# Patient Record
Sex: Female | Born: 1990 | Race: White | Hispanic: No | Smoking: Former smoker
Health system: Southern US, Community
[De-identification: ages and names within clinical notes are randomized; demographics above are authoritative.]

## PROBLEM LIST (undated history)

## (undated) ENCOUNTER — Inpatient Hospital Stay (HOSPITAL_COMMUNITY): Payer: Self-pay

## (undated) DIAGNOSIS — N83209 Unspecified ovarian cyst, unspecified side: Secondary | ICD-10-CM

## (undated) DIAGNOSIS — F32A Depression, unspecified: Secondary | ICD-10-CM

## (undated) DIAGNOSIS — I440 Atrioventricular block, first degree: Secondary | ICD-10-CM

## (undated) DIAGNOSIS — L309 Dermatitis, unspecified: Secondary | ICD-10-CM

## (undated) DIAGNOSIS — B9689 Other specified bacterial agents as the cause of diseases classified elsewhere: Secondary | ICD-10-CM

## (undated) DIAGNOSIS — N760 Acute vaginitis: Secondary | ICD-10-CM

## (undated) DIAGNOSIS — IMO0002 Reserved for concepts with insufficient information to code with codable children: Secondary | ICD-10-CM

## (undated) DIAGNOSIS — G510 Bell's palsy: Secondary | ICD-10-CM

## (undated) DIAGNOSIS — B999 Unspecified infectious disease: Secondary | ICD-10-CM

## (undated) DIAGNOSIS — K259 Gastric ulcer, unspecified as acute or chronic, without hemorrhage or perforation: Secondary | ICD-10-CM

## (undated) DIAGNOSIS — I071 Rheumatic tricuspid insufficiency: Secondary | ICD-10-CM

## (undated) DIAGNOSIS — I340 Nonrheumatic mitral (valve) insufficiency: Secondary | ICD-10-CM

## (undated) DIAGNOSIS — R011 Cardiac murmur, unspecified: Secondary | ICD-10-CM

## (undated) DIAGNOSIS — N390 Urinary tract infection, site not specified: Secondary | ICD-10-CM

## (undated) DIAGNOSIS — R87619 Unspecified abnormal cytological findings in specimens from cervix uteri: Secondary | ICD-10-CM

## (undated) DIAGNOSIS — F419 Anxiety disorder, unspecified: Secondary | ICD-10-CM

## (undated) DIAGNOSIS — D649 Anemia, unspecified: Secondary | ICD-10-CM

## (undated) DIAGNOSIS — F329 Major depressive disorder, single episode, unspecified: Secondary | ICD-10-CM

## (undated) HISTORY — PX: CRYOTHERAPY: SHX1416

## (undated) HISTORY — PX: MYRINGOTOMY WITH TUBE PLACEMENT: SHX5663

## (undated) HISTORY — PX: LEEP: SHX91

## (undated) HISTORY — PX: COLPOSCOPY: SHX161

## (undated) HISTORY — PX: ADENOIDECTOMY: SUR15

---
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
## 2008-02-18 ENCOUNTER — Emergency Department (HOSPITAL_BASED_OUTPATIENT_CLINIC_OR_DEPARTMENT_OTHER): Admission: EM | Admit: 2008-02-18 | Discharge: 2008-02-18 | Payer: Self-pay | Admitting: Emergency Medicine

## 2008-02-18 ENCOUNTER — Ambulatory Visit: Payer: Self-pay | Admitting: Radiology

## 2009-10-02 ENCOUNTER — Emergency Department (HOSPITAL_BASED_OUTPATIENT_CLINIC_OR_DEPARTMENT_OTHER): Admission: EM | Admit: 2009-10-02 | Discharge: 2009-10-02 | Payer: Self-pay | Admitting: Emergency Medicine

## 2010-04-20 ENCOUNTER — Emergency Department (HOSPITAL_BASED_OUTPATIENT_CLINIC_OR_DEPARTMENT_OTHER)
Admission: EM | Admit: 2010-04-20 | Discharge: 2010-04-20 | Disposition: A | Discharge status: Home / Self Care | Payer: 59 | Attending: Emergency Medicine | Admitting: Emergency Medicine

## 2010-04-20 DIAGNOSIS — G43909 Migraine, unspecified, not intractable, without status migrainosus: Secondary | ICD-10-CM | POA: Insufficient documentation

## 2010-04-20 DIAGNOSIS — F17200 Nicotine dependence, unspecified, uncomplicated: Secondary | ICD-10-CM | POA: Insufficient documentation

## 2010-04-20 DIAGNOSIS — R110 Nausea: Secondary | ICD-10-CM | POA: Insufficient documentation

## 2010-07-07 ENCOUNTER — Emergency Department (HOSPITAL_BASED_OUTPATIENT_CLINIC_OR_DEPARTMENT_OTHER)
Admission: EM | Admit: 2010-07-07 | Discharge: 2010-07-07 | Disposition: A | Discharge status: Home / Self Care | Payer: 59 | Attending: Emergency Medicine | Admitting: Emergency Medicine

## 2010-07-07 ENCOUNTER — Emergency Department (INDEPENDENT_AMBULATORY_CARE_PROVIDER_SITE_OTHER): Payer: 59

## 2010-07-07 DIAGNOSIS — Y9269 Other specified industrial and construction area as the place of occurrence of the external cause: Secondary | ICD-10-CM

## 2010-07-07 DIAGNOSIS — M25539 Pain in unspecified wrist: Secondary | ICD-10-CM

## 2010-07-07 DIAGNOSIS — X500XXA Overexertion from strenuous movement or load, initial encounter: Secondary | ICD-10-CM

## 2010-07-07 DIAGNOSIS — S63509A Unspecified sprain of unspecified wrist, initial encounter: Secondary | ICD-10-CM | POA: Insufficient documentation

## 2010-07-07 DIAGNOSIS — Y9289 Other specified places as the place of occurrence of the external cause: Secondary | ICD-10-CM | POA: Insufficient documentation

## 2010-07-07 DIAGNOSIS — F17200 Nicotine dependence, unspecified, uncomplicated: Secondary | ICD-10-CM | POA: Insufficient documentation
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
## 2010-08-03 NOTE — Discharge Summary (Signed)
Valerie [name], Valerie [name]                            ACCOUNT NO.:  [id number]   MEDICAL RECORD NO.:  [id number]                   PATIENT TYPE:  INP   LOCATION:  0602                                 FACILITY:  [facility]   PHYSICIAN:  Cindie Crumbly, M.D.               DATE OF BIRTH:  1990/06/12   DATE OF ADMISSION:  10/06/2002  DATE OF DISCHARGE:  10/11/2002                                 DISCHARGE SUMMARY   REASON FOR ADMISSION:  This 20 year old white female was admitted  complaining of depression status post overdose as a suicide attempt.  For  further history of present illness, please see the patient's psychiatric  admission assessment.   PHYSICAL EXAMINATION:  Physical examination was entirely unremarkable.Marland Kitchen   LABORATORY DATA:  The patient underwent a laboratory workup to rule out any  other medical problems contributing to her symptomatology.  Urine probe for  gonorrhea and Chlamydia were negative.  CBC showed an RBC 3.75 and was  otherwise unremarkable.  Basic metabolic panel was within normal limits.  Hepatic panel was within normal limits.  GGT was within normal limits.  TSH  and free T4 were unremarkable.  Urine pregnancy test was negative.  Urine  drug screen was positive for benzodiazepines.  UA was unremarkable.  RPR was  nonreactive.  The patient received no x-rays, no special procedures, no  additional consultations.  She sustained no complications during the course  of this hospitalization.  For further laboratory evaluate, please see the  patient's medical admission to the pediatric unit at [facility] prior to the  patient being transferred to this facility for more definitive  stabilization.   HOSPITAL COURSE:  On admission, the patient was psychomotor agitated.  Affect and mood were depressed, irritable, angry, and labile.  She displayed  and excessive reliance on borderline, antisocial, and histrionic defense  mechanisms.  She remained in denial of her chemical  dependency issues.  She  displayed poor impulse control, decreased concentration.  She was gamey and  manipulative, oppositional and defiant.  She was continued on Depakote ER  750 mg p.o. q.h.s., Strattera 80 mg p.o. daily.  Zoloft was increased to 100  mg per day, and Risperdal was continued on 1 mg q.h.s.  She tolerated these  medications well without side effects.  A valproic acid level taken both at  this facility and the pediatric unit at [facility] showed three separate  valproic acid  levels that were all within the therapeutic range.  At the  time of discharge, the patient's affect and mood have improved.  She has  displayed no evidence of a thought disorder throughout her hospital course.  She is actively participating in all aspects of the therapeutic treatment  program, no longer appears to be danger to herself or others and  consequently is felt to have reached her maximum benefits of hospitalization  and is ready for  discharge to a less restrictive alternative setting.   CONDITION ON DISCHARGE:  Her condition on discharge is improved.   DIAGNOSES:  Diagnoses according to DSM-IV:   AXIS I:  1. Bipolar disorder, mixed type, severe without psychosis.  2. Rule out substance-induced mood disorder.  3. Rule out major depression.  4. Polysubstance dependence.  5. Attention-deficit hyperactivity disorder, combined type.  6. Conduct disorder.   AXIS II:  1. Borderline, antisocial, and histrionic traits.  2. Rule out personality disorder, not otherwise specified.  3. Rule out learning disorder, not otherwise specified.   AXIS III:  None.   AXIS IV:  Severe.   AXIS V:  20 on admission, 30 on discharge.   FURTHER EVALUATION AND TREATMENT RECOMMENDATIONS:  1. The patient is discharged to home.  2. She is discharged on an unrestricted level of activity and a regular     diet.  3. She will follow up with her outpatient psychiatrist, Jasmine Pang,     M.D., for all  further aspects of her psychiatric care and consequently, I     will sign off on the case at this time.  4. She will follow up with her primary care physician for all further     aspects of her medical care.   DISCHARGE MEDICATIONS:  1. Depakote ER 750 mg p.o. q.h.s.  2. Zoloft 100 mg p.o. daily.  3. Strattera 80 mg p.o. q.a.m.  4. Risperdal 1 mg p.o. q.h.s.                                                Cindie Crumbly, M.D.    TS/MEDQ  D:  10/11/2002  T:  10/11/2002  Job:  [id number]

## 2010-08-03 NOTE — H&P (Signed)
Valerie [name], Valerie [name]                            ACCOUNT NO.:  [id number]   MEDICAL RECORD NO.:  [id number]                   PATIENT TYPE:  INP   LOCATION:  0602                                 FACILITY:  [facility]   PHYSICIAN:  Cindie Crumbly, M.D.               DATE OF BIRTH:  [birth year]   DATE OF ADMISSION:  10/06/2002  DATE OF DISCHARGE:                         PSYCHIATRIC ADMISSION ASSESSMENT   REASON FOR ADMISSION:  This 20 year old white female was admitted  complaining of depression and status post overdose as a suicide attempt.  She refused to contract for safety.   HISTORY OF PRESENT ILLNESS:  The patient complains of increasingly  depressed, irritable, angry mood most of the day nearly every day over the  past six months along with anhedonia, decreased school performance,  hopelessness, helplessness, worthlessness, decreased concentration and  energy level, increased symptoms of fatigue, insomnia, weight gain,  recurrent thoughts of death, and continued suicidal ideation.  She states  that I have nothing left to live for.  She admits to current psychosocial  stressoring as the fact that a 63 year old boyfriend she has been going out  with for one and one-half months has broken off the relationship with her,  and he has gotten another girl pregnant, apparently during the time that he  was going out with this patient.   PAST PSYCHIATRIC HISTORY:  Significant for conduct disorder, attention-  deficit hyperactivity disorder, combined-type, major depression versus a  bipolar disorder versus a possible substance-induced mood disorder.  She was  an inpatient at [facility] approximately two weeks  ago.  She has been followed in outpatient therapy by Dr. Milford Cage and  Vidal Schwalbe, her outpatient therapist, whom she sees on a weekly  basis.  She currently has legal charges pending for violation of probation  secondary to breaking curfew.  She has  been in juvenile detention in the  past for running away from home.  She has a longstanding history of  polysubstance dependence; this includes drinking alcohol when available,  smoking cannabis when available.  She smokes one-half pack of cigarettes per  day.  She reports that she will start popping pills, most recently Xanax  and Concerta or whatever my friends give to me.   PAST MEDICAL HISTORY:  1. Adenoidectomy in the past.  2. Tympanostomy tubes as a young child for chronic otitis media.  3. She denies any current medical or surgical problems.   ALLERGIES:  She has no known drug allergies or sensitivities.   CURRENT MEDICATIONS:  1. Depakote ER 750 mg p.o. q.h.s.  2. Strattera 80 mg p.o. q.a.m.  3. Zoloft 50 mg p.o. daily.  4. Risperdal 1 mg q.h.s.   STRENGTHS AND ASSETS:  Her mother is supportive of her.   FAMILY AND SOCIAL HISTORY:  The patient lives with her mother.  Grandmother  has a history of alcoholism.  Father has a history of polysubstance  dependence.  Maternal grandmother has a history of bipolar disorder.  The  patient is repeating the seventh grade after having missed 30 to 40 days of  school last year.   MENTAL STATUS EXAM:  The patient presents as a well-developed, well-  nourished, adolescent white female who is alert and oriented times four,  psychomotor agitated, and whose appearance is compatible with her stated  age.  She displays an excessive reliance of borderline antisocial and  histrionic defense mechanisms.  She remains in denial of her chemical  dependency issues.  Her affect and mood are depressed, irritable, angry, and  labile.  She displays poor impulse control, decreased concentration.  Her  insight is nil.  She is oppositional and defiant, is gamy and manipulative.  Her immediate recall, short-term memory, and remote memory are intact.  Similarities and differences are within normal limits, and she is able to  abstract simple proverbs.  Her  thought processes are goal-directed.   DIAGNOSES:  Diagnoses according to DSM-IV:   AXIS I:  1. Bipolar disorder, mixed-type, severe, without psychosis.  2. Rule out major depression.  3. Rule out substance-induced mood disorder.  4. Polysubstance dependence.  5. Attention-deficit hyperactivity disorder, combined-type.  6. Conduct disorder.   AXIS II:  1. Borderline antisocial and histrionic traits.  2. Rule out personality disorder, not otherwise specified.  3. Rule out learning disorder, not otherwise specified.   AXIS III:  None.   AXIS IV:  Severe.   AXIS V:  Code 20.   FURTHER EVALUATION AND TREATMENT RECOMMENDATIONS:  1. The estimated length of stay of the patient on the inpatient unit is five     to seven days.  2. Initial discharge plan is to discharge the patient home.  3. Initial plan of care is to continue the patient on her present     medications and titrate Zoloft upward to a therapeutic dose.  4. A valproic acid level will be checked to determine if she is on a high     enough dose of Depakote.  5. A laboratory workup will also be initiated to rule out any other medical     problems contributing to her symptomatology.  6. Psychotherapy will focus on improving the patient's impulse control,     decreasing cognitive distortions, confronting her chemical dependency     issues, and decreasing potential for self harm.                                               Cindie Crumbly, M.D.    TS/MEDQ  D:  10/07/2002  T:  10/08/2002  Job:  [id number]

## 2010-08-28 ENCOUNTER — Emergency Department (HOSPITAL_BASED_OUTPATIENT_CLINIC_OR_DEPARTMENT_OTHER)
Admission: EM | Admit: 2010-08-28 | Discharge: 2010-08-28 | Disposition: A | Discharge status: Home / Self Care | Payer: Self-pay | Attending: Emergency Medicine | Admitting: Emergency Medicine

## 2010-08-28 DIAGNOSIS — G43909 Migraine, unspecified, not intractable, without status migrainosus: Secondary | ICD-10-CM | POA: Insufficient documentation

## 2010-12-24 ENCOUNTER — Emergency Department (HOSPITAL_BASED_OUTPATIENT_CLINIC_OR_DEPARTMENT_OTHER)
Admission: EM | Admit: 2010-12-24 | Discharge: 2010-12-24 | Disposition: A | Discharge status: Home / Self Care | Payer: BC Managed Care – PPO | Attending: Emergency Medicine | Admitting: Emergency Medicine

## 2010-12-24 ENCOUNTER — Encounter: Payer: Self-pay | Admitting: *Deleted

## 2010-12-24 DIAGNOSIS — R51 Headache: Secondary | ICD-10-CM

## 2010-12-24 DIAGNOSIS — R110 Nausea: Secondary | ICD-10-CM | POA: Insufficient documentation

## 2010-12-24 DIAGNOSIS — Y93E1 Activity, personal bathing and showering: Secondary | ICD-10-CM | POA: Insufficient documentation

## 2010-12-24 DIAGNOSIS — F17200 Nicotine dependence, unspecified, uncomplicated: Secondary | ICD-10-CM | POA: Insufficient documentation

## 2010-12-24 DIAGNOSIS — Y92009 Unspecified place in unspecified non-institutional (private) residence as the place of occurrence of the external cause: Secondary | ICD-10-CM | POA: Insufficient documentation

## 2010-12-24 DIAGNOSIS — W2209XA Striking against other stationary object, initial encounter: Secondary | ICD-10-CM | POA: Insufficient documentation

## 2010-12-24 MED ORDER — KETOROLAC TROMETHAMINE 60 MG/2ML IM SOLN
60.0000 mg | Freq: Once | INTRAMUSCULAR | Status: AC
  Administered 2010-12-24: 60 mg via INTRAMUSCULAR
  Filled 2010-12-24: qty 2

## 2010-12-24 MED ORDER — PROMETHAZINE HCL 25 MG/ML IJ SOLN
25.0000 mg | Freq: Once | INTRAMUSCULAR | Status: AC
  Administered 2010-12-24: 25 mg via INTRAMUSCULAR
  Filled 2010-12-24: qty 1

## 2010-12-24 NOTE — ED Provider Notes (Signed)
History     CSN: [id number] Arrival date & time: 12/24/2010  4:02 PM  Chief Complaint  Patient presents with  . Head Injury  . Migraine    (Consider location/radiation/quality/duration/timing/severity/associated sxs/prior treatment) Patient is a 20 y.o. female presenting with headaches. The history is provided by the patient. No language interpreter was used.  Headache  This is a new problem. The current episode started 6 to 12 hours ago. The problem occurs constantly. The problem has not changed since onset.The headache is associated with bright light. The pain is located in the right unilateral region. The pain is at a severity of 5/10. The pain is moderate. The pain does not radiate. Associated symptoms include nausea. She has tried nothing for the symptoms. The treatment provided mild relief.  Pt complains of a headche.  Pt reports she hit her head on the shower yesterday.  Pt reports she did not lose conciousness.  Pt reports nausea is worse than headache. Past Medical History  Diagnosis Date  . Migraine     Past Surgical History  Procedure Date  . Colposcopy     History reviewed. No pertinent family history.  History  Substance Use Topics  . Smoking status: Current Everyday Smoker -- 0.5 packs/day  . Smokeless tobacco: Not on file  . Alcohol Use: No    OB History    Grav Para Term Preterm Abortions TAB SAB Ect Mult Living                  Review of Systems  Gastrointestinal: Positive for nausea.  Neurological: Positive for headaches.  All other systems reviewed and are negative.    Allergies  Vicodin  Home Medications  No current outpatient prescriptions on file.  BP 123/70  Pulse 74  Temp(Src) 98 F (36.7 C) (Oral)  Resp 16  Ht 5\' 5"  (1.651 m)  Wt 140 lb (63.504 kg)  BMI 23.30 kg/m2  SpO2 100%  LMP 12/20/2010  Physical Exam  Nursing note and vitals reviewed. Constitutional: She is oriented to person, place, and time. She appears well-developed  and well-nourished.  HENT:  Head: Normocephalic and atraumatic.  Right Ear: External ear normal.  Left Ear: External ear normal.  Nose: Nose normal.  Mouth/Throat: Oropharynx is clear and moist.  Eyes: Conjunctivae and EOM are normal. Pupils are equal, round, and reactive to light.  Neck: Normal range of motion. Neck supple.  Cardiovascular: Normal rate.   Pulmonary/Chest: Effort normal.  Abdominal: Soft. Bowel sounds are normal.  Musculoskeletal: Normal range of motion.  Neurological: She is alert and oriented to person, place, and time. She has normal reflexes.  Skin: Skin is warm and dry.  Psychiatric: She has a normal mood and affect.    ED Course  Procedures (including critical care time)  Labs Reviewed - No data to display No results found.   No diagnosis found.    MDM  Pt reports torodol and phenergan works best for her. Pt given IM torodol and phenergan        Langston Masker, [place] 12/24/10 1652

## 2010-12-24 NOTE — ED Notes (Signed)
Pt c/o " migraine x 2 days" hit head on shower door last pm

## 2010-12-24 NOTE — ED Provider Notes (Signed)
Medical screening examination/treatment/procedure(s) were performed by non-physician practitioner and as supervising physician I was immediately available for consultation/collaboration.   Lyanne Co, MD 12/24/10 [phone]

## 2011-02-13 ENCOUNTER — Other Ambulatory Visit (HOSPITAL_COMMUNITY)
Admission: RE | Admit: 2011-02-13 | Discharge: 2011-02-13 | Disposition: A | Discharge status: Home / Self Care | Payer: BC Managed Care – PPO | Source: Ambulatory Visit | Attending: Family Medicine | Admitting: Family Medicine

## 2011-02-13 DIAGNOSIS — Z124 Encounter for screening for malignant neoplasm of cervix: Secondary | ICD-10-CM | POA: Insufficient documentation

## 2011-09-12 ENCOUNTER — Emergency Department (HOSPITAL_BASED_OUTPATIENT_CLINIC_OR_DEPARTMENT_OTHER): Payer: BC Managed Care – PPO

## 2011-09-12 ENCOUNTER — Encounter (HOSPITAL_BASED_OUTPATIENT_CLINIC_OR_DEPARTMENT_OTHER): Payer: Self-pay | Admitting: Emergency Medicine

## 2011-09-12 ENCOUNTER — Emergency Department (HOSPITAL_BASED_OUTPATIENT_CLINIC_OR_DEPARTMENT_OTHER)
Admission: EM | Admit: 2011-09-12 | Discharge: 2011-09-12 | Disposition: A | Discharge status: Home / Self Care | Payer: BC Managed Care – PPO | Attending: Emergency Medicine | Admitting: Emergency Medicine

## 2011-09-12 DIAGNOSIS — F17200 Nicotine dependence, unspecified, uncomplicated: Secondary | ICD-10-CM | POA: Insufficient documentation

## 2011-09-12 DIAGNOSIS — Z79899 Other long term (current) drug therapy: Secondary | ICD-10-CM | POA: Insufficient documentation

## 2011-09-12 DIAGNOSIS — R112 Nausea with vomiting, unspecified: Secondary | ICD-10-CM

## 2011-09-12 DIAGNOSIS — R109 Unspecified abdominal pain: Secondary | ICD-10-CM | POA: Insufficient documentation

## 2011-09-12 DIAGNOSIS — R197 Diarrhea, unspecified: Secondary | ICD-10-CM | POA: Insufficient documentation

## 2011-09-12 HISTORY — DX: Gastric ulcer, unspecified as acute or chronic, without hemorrhage or perforation: K25.9

## 2011-09-12 HISTORY — DX: Urinary tract infection, site not specified: N39.0

## 2011-09-12 LAB — COMPREHENSIVE METABOLIC PANEL
ALT: 14 U/L (ref 0–35)
AST: 16 U/L (ref 0–37)
Albumin: 4.1 g/dL (ref 3.5–5.2)
Alkaline Phosphatase: 107 U/L (ref 39–117)
CO2: 22 mEq/L (ref 19–32)
Chloride: 107 mEq/L (ref 96–112)
Creatinine, Ser: 0.6 mg/dL (ref 0.50–1.10)
GFR calc non Af Amer: >90 mL/min (ref >90)
Potassium: 3.9 mEq/L (ref 3.5–5.1)
Sodium: 137 mEq/L (ref 135–145)
Total Bilirubin: 0.2 mg/dL — ABNORMAL LOW (ref 0.3–1.2)

## 2011-09-12 LAB — CBC
HCT: 36.1 % (ref 36.0–46.0)
MCHC: 34.3 g/dL (ref 30.0–36.0)
RDW: 12.4 % (ref 11.5–15.5)
WBC: 9.5 10*3/uL (ref 4.0–10.5)

## 2011-09-12 LAB — URINALYSIS, ROUTINE W REFLEX MICROSCOPIC
Glucose, UA: NEGATIVE mg/dL
Leukocytes, UA: NEGATIVE
Nitrite: NEGATIVE
Protein, ur: NEGATIVE mg/dL
pH: 5.5 (ref 5.0–8.0)

## 2011-09-12 LAB — DIFFERENTIAL
Basophils Absolute: 0 10*3/uL (ref 0.0–0.1)
Basophils Relative: 0 % (ref 0–1)
Lymphocytes Relative: 13 % (ref 12–46)
Monocytes Absolute: 0.3 10*3/uL (ref 0.1–1.0)
Neutro Abs: 7.8 10*3/uL — ABNORMAL HIGH (ref 1.7–7.7)
Neutrophils Relative %: 82 % — ABNORMAL HIGH (ref 43–77)

## 2011-09-12 LAB — PREGNANCY, URINE: Preg Test, Ur: NEGATIVE

## 2011-09-12 MED ORDER — SODIUM CHLORIDE 0.9 % IV BOLUS (SEPSIS)
1000.0000 mL | Freq: Once | INTRAVENOUS | Status: AC
  Administered 2011-09-12: 1000 mL via INTRAVENOUS

## 2011-09-12 MED ORDER — IOHEXOL 300 MG/ML  SOLN
100.0000 mL | Freq: Once | INTRAMUSCULAR | Status: AC | PRN
  Administered 2011-09-12: 100 mL via INTRAVENOUS

## 2011-09-12 MED ORDER — ONDANSETRON 8 MG PO TBDP: 8.0000 mg | ORAL_TABLET | Freq: Three times a day (TID) | ORAL | Status: AC | PRN

## 2011-09-12 MED ORDER — IOHEXOL 300 MG/ML  SOLN
20.0000 mL | INTRAMUSCULAR | Status: AC
  Administered 2011-09-12: 20 mL via ORAL

## 2011-09-12 MED ORDER — MORPHINE SULFATE 4 MG/ML IJ SOLN
4.0000 mg | Freq: Once | INTRAMUSCULAR | Status: AC
  Administered 2011-09-12: 4 mg via INTRAVENOUS
  Filled 2011-09-12: qty 1

## 2011-09-12 MED ORDER — OXYCODONE-ACETAMINOPHEN 5-325 MG PO TABS: 1.0000 | ORAL_TABLET | Freq: Four times a day (QID) | ORAL | Status: AC | PRN

## 2011-09-12 MED ORDER — OXYCODONE-ACETAMINOPHEN 5-325 MG PO TABS
1.0000 | ORAL_TABLET | Freq: Once | ORAL | Status: AC
  Administered 2011-09-12: 1 via ORAL
  Filled 2011-09-12: qty 1

## 2011-09-12 MED ORDER — ONDANSETRON HCL 4 MG/2ML IJ SOLN
4.0000 mg | Freq: Once | INTRAMUSCULAR | Status: AC
  Administered 2011-09-12: 4 mg via INTRAVENOUS
  Filled 2011-09-12: qty 2

## 2011-09-12 NOTE — ED Provider Notes (Addendum)
History     CSN: [id number]  Arrival date & time 09/12/11  1019   First MD Initiated Contact with Patient 09/12/11 1020      Chief Complaint  Patient presents with  . Nausea  . Emesis  . Diarrhea    (Consider location/radiation/quality/duration/timing/severity/associated sxs/prior treatment) HPI Patient is a 21 year old female who presents today complaining of awakening with nausea, vomiting, and diarrhea last night as well as 8/10 sharp right lower quadrant abdominal pain. Patient has not had any fever or known sick contacts. She has no history of abdominal surgeries for ovarian cysts. Patient does not think that she could be pregnant. She denies any vaginal discharge. Patient is planning to be married tomorrow and has been up tonight overnight with nausea, vomiting, and diarrhea since a bachelorette party. Patient is hemodynamically stable upon presentation. At home the patient had taken over-the-counter medications without relief. There are no other associated or modifying factors. Past Medical History  Diagnosis Date  . Migraine   . Gastric ulcer   . UTI (urinary tract infection)     Past Surgical History  Procedure Date  . Colposcopy     History reviewed. No pertinent family history.  History  Substance Use Topics  . Smoking status: Current Everyday Smoker -- 0.5 packs/day  . Smokeless tobacco: Not on file  . Alcohol Use: Yes    OB History    Grav Para Term Preterm Abortions TAB SAB Ect Mult Living                  Review of Systems  Constitutional: Negative.   HENT: Negative.   Eyes: Negative.   Respiratory: Negative.   Cardiovascular: Negative.   Gastrointestinal: Positive for nausea, vomiting, abdominal pain and diarrhea.  Genitourinary: Negative.   Musculoskeletal: Negative.   Skin: Negative.   Neurological: Negative.   Hematological: Negative.   Psychiatric/Behavioral: Negative.   All other systems reviewed and are negative.    Allergies    Augmentin; Raspberry; and Vicodin  Home Medications   Current Outpatient Rx  Name Route Sig Dispense Refill  . OVER THE COUNTER MEDICATION  Diurex, otc    . ETONOGESTREL-ETHINYL ESTRADIOL 0.12-0.015 MG/24HR VA RING Vaginal Place 1 each vaginally every 28 (twenty-eight) days. Insert vaginally and leave in place for 3 consecutive weeks, then remove for 1 week.     . IBUPROFEN 200 MG PO TABS Oral Take 600 mg by mouth every 6 (six) hours as needed. For pain     . ONDANSETRON 8 MG PO TBDP Oral Take 1 tablet (8 mg total) by mouth every 8 (eight) hours as needed for nausea. 20 tablet 0  . OXYCODONE-ACETAMINOPHEN 5-325 MG PO TABS Oral Take 1 tablet by mouth every 6 (six) hours as needed for pain. 15 tablet 0    BP 115/82  Pulse 91  Temp 98 F (36.7 C) (Oral)  Resp 16  Ht 5\' 4"  (1.626 m)  Wt 151 lb (68.493 kg)  BMI 25.92 kg/m2  SpO2 100%  LMP 09/05/2011  Physical Exam  Nursing note and vitals reviewed. GEN: Well-developed, well-nourished female in no distress, very uncomfortable appearing HEENT: Atraumatic, normocephalic.  EYES: PERRLA BL, no scleral icterus. NECK: Trachea midline, no meningismus CV: regular rate and rhythm. No murmurs, rubs, or gallops PULM: No respiratory distress.  No crackles, wheezes, or rales. GI: soft, RLQ TTP. +guarding, no rebound. + bowel sounds  GU: no vaginal d/c or abnormalities noted on speculum exam, no CMT, no adnexal tenderness Neuro:  cranial nerves grossly 2-12 intact, no abnormalities of strength or sensation, A and O x 3 MSK: Patient moves all 4 extremities symmetrically, no deformity, edema, or injury noted Skin: No rashes petechiae, purpura, or jaundice Psych: no abnormality of mood   ED Course  Procedures (including critical care time)  Labs Reviewed  DIFFERENTIAL - Abnormal; Notable for the following:    Neutrophils Relative 82 (*)     Neutro Abs 7.8 (*)     All other components within normal limits  COMPREHENSIVE METABOLIC PANEL -  Abnormal; Notable for the following:    Glucose, Bld 106 (*)     Total Bilirubin 0.2 (*)     All other components within normal limits  PREGNANCY, URINE  URINALYSIS, ROUTINE W REFLEX MICROSCOPIC  CBC  LIPASE, BLOOD   Ct Abdomen Pelvis W Contrast  09/12/2011  *RADIOLOGY REPORT*  Clinical Data: Right lower quadrant pain.  Nausea and vomiting. Diarrhea.  CT ABDOMEN AND PELVIS WITH CONTRAST  Technique:  Multidetector CT imaging of the abdomen and pelvis was performed following the standard protocol during bolus administration of intravenous contrast.  Contrast:  OMNIPAQUE IOHEXOL 300 MG/ML  SOLN  Comparison: None.  Findings: The abdominal parenchymal organs are normal in appearance.  No evidence of soft tissue masses or lymphadenopathy within the abdomen or pelvis.  The uterus and adnexa are unremarkable.  No inflammatory process or abnormal fluid collections are identified.  Gallbladder is unremarkable.  No evidence of bowel wall thickening or dilatation.  IMPRESSION: Negative.  No acute findings or other significant abnormality identified.  Original Report Authenticated By: Danae Orleans, M.D.     1. Abdominal pain   2. Nausea vomiting and diarrhea       MDM  Patient was evaluated by myself and treated with IV fluids, morphine, and Zofran. Patient had negative urine pregnancy and unremarkable urine. Given her symptoms as well as her history I was concerned that the patient could have an appendicitis. Patient had no history of ovarian cysts and denied any vaginal discharge. Patient also was not pregnant today. CT of abdomen and pelvis was performed. Lab workup was negative including no anemia, no leukocytosis, normal liver function panel, and normal lipase. Patient did have a CT which was completely unremarkable including the uterus and adnexa. She did require additional dose of pain and nausea medication. Based on her presentation I think her symptoms may likely be a combination of reaction  to alcohol from the previous night as well as a possible viral process. We discussed that patient could have a very early appendicitis is not yet radiographically identifiable or noted to have a leukocytosis on lab work. Family was given precautions for reasons to return. I discussed performing pelvic exam with the patient and she did not feel this is necessary given that she was having no vaginal discharge, she was not pregnant, and CT results indicated no abnormalities in the genitourinary system. Patient was discharged in good condition with prescriptions for pain and nausea medication. She is welcome to return if she has worsening of her symptoms or if she has any other emergent concerns.        Cyndra Numbers, MD 09/14/11 [id number]  Cyndra Numbers, MD 10/07/11 2149

## 2011-09-12 NOTE — ED Notes (Signed)
N/V/D since last night.  Pt c/o RLQ abdominal pain.   No known fever.  Pt taking diurex lately.

## 2012-03-04 ENCOUNTER — Emergency Department (HOSPITAL_BASED_OUTPATIENT_CLINIC_OR_DEPARTMENT_OTHER)
Admission: EM | Admit: 2012-03-04 | Discharge: 2012-03-04 | Disposition: A | Discharge status: Home / Self Care | Payer: 59 | Attending: Emergency Medicine | Admitting: Emergency Medicine

## 2012-03-04 ENCOUNTER — Encounter (HOSPITAL_BASED_OUTPATIENT_CLINIC_OR_DEPARTMENT_OTHER): Payer: Self-pay | Admitting: Emergency Medicine

## 2012-03-04 DIAGNOSIS — Z79899 Other long term (current) drug therapy: Secondary | ICD-10-CM | POA: Insufficient documentation

## 2012-03-04 DIAGNOSIS — Z8719 Personal history of other diseases of the digestive system: Secondary | ICD-10-CM | POA: Insufficient documentation

## 2012-03-04 DIAGNOSIS — R110 Nausea: Secondary | ICD-10-CM | POA: Insufficient documentation

## 2012-03-04 DIAGNOSIS — Z87440 Personal history of urinary (tract) infections: Secondary | ICD-10-CM | POA: Insufficient documentation

## 2012-03-04 DIAGNOSIS — F17200 Nicotine dependence, unspecified, uncomplicated: Secondary | ICD-10-CM | POA: Insufficient documentation

## 2012-03-04 DIAGNOSIS — G43909 Migraine, unspecified, not intractable, without status migrainosus: Secondary | ICD-10-CM | POA: Insufficient documentation

## 2012-03-04 MED ORDER — ONDANSETRON HCL 4 MG/2ML IJ SOLN
INTRAMUSCULAR | Status: AC
  Administered 2012-03-04: 4 mg via INTRAVENOUS
  Filled 2012-03-04: qty 2

## 2012-03-04 MED ORDER — ONDANSETRON HCL 4 MG/2ML IJ SOLN
4.0000 mg | Freq: Once | INTRAMUSCULAR | Status: AC
  Administered 2012-03-04: 4 mg via INTRAVENOUS
  Filled 2012-03-04: qty 2

## 2012-03-04 MED ORDER — KETOROLAC TROMETHAMINE 15 MG/ML IJ SOLN
15.0000 mg | Freq: Once | INTRAMUSCULAR | Status: AC
  Administered 2012-03-04: 15 mg via INTRAVENOUS
  Filled 2012-03-04: qty 1

## 2012-03-04 MED ORDER — DIPHENHYDRAMINE HCL 50 MG/ML IJ SOLN
25.0000 mg | Freq: Once | INTRAMUSCULAR | Status: AC
  Administered 2012-03-04: 25 mg via INTRAVENOUS
  Filled 2012-03-04: qty 1

## 2012-03-04 MED ORDER — DEXAMETHASONE SODIUM PHOSPHATE 10 MG/ML IJ SOLN
INTRAMUSCULAR | Status: AC
  Administered 2012-03-04: 10 mg via INTRAVENOUS
  Filled 2012-03-04: qty 1

## 2012-03-04 MED ORDER — SODIUM CHLORIDE 0.9 % IV BOLUS (SEPSIS)
1000.0000 mL | Freq: Once | INTRAVENOUS | Status: AC
  Administered 2012-03-04: 1000 mL via INTRAVENOUS

## 2012-03-04 MED ORDER — DEXAMETHASONE SODIUM PHOSPHATE 10 MG/ML IJ SOLN
10.0000 mg | Freq: Once | INTRAMUSCULAR | Status: AC
  Administered 2012-03-04: 10 mg via INTRAVENOUS

## 2012-03-04 MED ORDER — METOCLOPRAMIDE HCL 5 MG/ML IJ SOLN
10.0000 mg | Freq: Once | INTRAMUSCULAR | Status: AC
  Administered 2012-03-04: 10 mg via INTRAVENOUS
  Filled 2012-03-04: qty 2

## 2012-03-04 NOTE — ED Notes (Signed)
MD at bedside. 

## 2012-03-04 NOTE — ED Notes (Signed)
Pt vomited x3 after receiving Reglan.  MD notified.  Order received for Zofran.

## 2012-03-04 NOTE — ED Notes (Signed)
HA x2 days.  Worse this evening. Nausea but no vomiting.

## 2012-03-04 NOTE — ED Provider Notes (Signed)
History     CSN: [id number]  Arrival date & time 03/04/12  0043   First MD Initiated Contact with Patient 03/04/12 0054      Chief Complaint  Patient presents with  . Headache    (Consider location/radiation/quality/duration/timing/severity/associated sxs/prior treatment) HPI This is a 21 year old female with a history of migraines. She is here with a two-day history of a headache consistent with prior migraines. It is located in the head bilaterally and characterized as feeling like someone is squeezing and trying to "pop" her head. It is accompanied by nausea, but no vomiting, as well as photophobia. There is no focal neurologic deficit. The headache and nausea have worsened over the past several hours. The pain is now moderate to severe.  Past Medical History  Diagnosis Date  . Migraine   . Gastric ulcer   . UTI (urinary tract infection)     Past Surgical History  Procedure Date  . Colposcopy     No family history on file.  History  Substance Use Topics  . Smoking status: Current Every Day Smoker -- 0.5 packs/day  . Smokeless tobacco: Not on file  . Alcohol Use: Yes    OB History    Grav Para Term Preterm Abortions TAB SAB Ect Mult Living                  Review of Systems  All other systems reviewed and are negative.    Allergies  Augmentin; Raspberry; and Vicodin  Home Medications   Current Outpatient Rx  Name  Route  Sig  Dispense  Refill  . ETONOGESTREL-ETHINYL ESTRADIOL 0.12-0.015 MG/24HR VA RING   Vaginal   Place 1 each vaginally every 28 (twenty-eight) days. Insert vaginally and leave in place for 3 consecutive weeks, then remove for 1 week.          . IBUPROFEN 200 MG PO TABS   Oral   Take 600 mg by mouth every 6 (six) hours as needed. For pain          . OVER THE COUNTER MEDICATION      Diurex, otc           BP 112/82  Pulse 84  Temp 98.3 F (36.8 C) (Oral)  Resp 16  Ht 5\' 5"  (1.651 m)  Wt 158 lb (71.668 kg)  BMI 26.29  kg/m2  SpO2 97%  LMP 02/13/2012  Physical Exam General: Well-developed, well-nourished female in no acute distress; appearance consistent with age of record HENT: normocephalic, atraumatic Eyes: pupils equal round and reactive to light; extraocular muscles intact; photophobia Neck: supple Heart: regular rate and rhythm Lungs: clear to auscultation bilaterally Abdomen: soft; nondistended; nontender; bowel sounds present Extremities: No deformity; full range of motion Neurologic: Awake, alert and oriented; motor function intact in all extremities and symmetric; no facial droop; normal coordination speech; normal gait Skin: Warm and dry     ED Course  Procedures (including critical care time)     MDM  1:55 AM Patient feeling much better after IV medications and IV fluid bolus. Nausea has resolved and pain is improved.         Hanley Seamen, MD 03/04/12 [phone]

## 2012-11-16 ENCOUNTER — Encounter (HOSPITAL_COMMUNITY): Payer: Self-pay | Admitting: *Deleted

## 2012-11-16 ENCOUNTER — Inpatient Hospital Stay (HOSPITAL_COMMUNITY)
Admission: AD | Admit: 2012-11-16 | Discharge: 2012-11-16 | Disposition: A | Discharge status: Home / Self Care | Payer: BC Managed Care – PPO | Source: Ambulatory Visit | Attending: Obstetrics and Gynecology | Admitting: Obstetrics and Gynecology

## 2012-11-16 ENCOUNTER — Inpatient Hospital Stay (HOSPITAL_COMMUNITY): Payer: BC Managed Care – PPO

## 2012-11-16 DIAGNOSIS — O209 Hemorrhage in early pregnancy, unspecified: Secondary | ICD-10-CM | POA: Insufficient documentation

## 2012-11-16 DIAGNOSIS — R109 Unspecified abdominal pain: Secondary | ICD-10-CM | POA: Insufficient documentation

## 2012-11-16 DIAGNOSIS — O200 Threatened abortion: Secondary | ICD-10-CM

## 2012-11-16 DIAGNOSIS — A499 Bacterial infection, unspecified: Secondary | ICD-10-CM | POA: Insufficient documentation

## 2012-11-16 DIAGNOSIS — O2390 Unspecified genitourinary tract infection in pregnancy, unspecified trimester: Secondary | ICD-10-CM | POA: Insufficient documentation

## 2012-11-16 DIAGNOSIS — N760 Acute vaginitis: Secondary | ICD-10-CM | POA: Insufficient documentation

## 2012-11-16 DIAGNOSIS — B9689 Other specified bacterial agents as the cause of diseases classified elsewhere: Secondary | ICD-10-CM | POA: Insufficient documentation

## 2012-11-16 HISTORY — DX: Unspecified infectious disease: B99.9

## 2012-11-16 HISTORY — DX: Reserved for concepts with insufficient information to code with codable children: IMO0002

## 2012-11-16 HISTORY — DX: Unspecified abnormal cytological findings in specimens from cervix uteri: R87.619

## 2012-11-16 HISTORY — DX: Unspecified ovarian cyst, unspecified side: N83.209

## 2012-11-16 HISTORY — DX: Dermatitis, unspecified: L30.9

## 2012-11-16 LAB — WET PREP, GENITAL: Yeast Wet Prep HPF POC: NONE SEEN

## 2012-11-16 LAB — CBC
MCH: 30.5 pg (ref 26.0–34.0)
MCHC: 33.5 g/dL (ref 30.0–36.0)
MCV: 90.9 fL (ref 78.0–100.0)
Platelets: 171 10*3/uL (ref 150–400)
RBC: 3.74 MIL/uL — ABNORMAL LOW (ref 3.87–5.11)
RDW: 13.7 % (ref 11.5–15.5)

## 2012-11-16 LAB — URINALYSIS, ROUTINE W REFLEX MICROSCOPIC
Bilirubin Urine: NEGATIVE
Glucose, UA: NEGATIVE mg/dL
Specific Gravity, Urine: >1.03 — ABNORMAL HIGH (ref 1.005–1.030)
Urobilinogen, UA: 0.2 mg/dL (ref 0.0–1.0)

## 2012-11-16 LAB — OB RESULTS CONSOLE GC/CHLAMYDIA
Chlamydia: NEGATIVE
Gonorrhea: NEGATIVE

## 2012-11-16 LAB — ABO/RH: ABO/RH(D): O POS

## 2012-11-16 LAB — POCT PREGNANCY, URINE: Preg Test, Ur: POSITIVE — AB

## 2012-11-16 MED ORDER — METRONIDAZOLE 500 MG PO TABS: 500.0000 mg | ORAL_TABLET | Freq: Two times a day (BID) | ORAL | Status: DC

## 2012-11-16 NOTE — MAU Note (Signed)
Been having abd cramping since prior to + preg. Had been treated for UTI ([facility] in [place]).  Went to [facility] yesterday.  When came home had sex and has been spotting since.

## 2012-11-16 NOTE — MAU Note (Signed)
Patient is in with c/o lower abdominal cramping and recurrent spotting. She did not have a pad or pantiliner on arrival, patient states that she notices blood after wiping. She is concerned because the bleeding had stopped and now started again.

## 2012-11-16 NOTE — MAU Provider Note (Signed)
History     CSN: [id number]  Arrival date and time: 11/16/12 1503   First Provider Initiated Contact with Patient 11/16/12 1604      Chief Complaint  Patient presents with  . Abdominal Pain  . Vaginal Bleeding   HPI Ms. Valerie [name] is a 22 y.o. G3P0020 at [redacted]w[redacted]d who presents to MAU today with complaint of vaginal bleeding. The patient states that she had intercourse last night and noted some spotting afterwards. She noticed the spotting again this morning. She has also been having moderate lower abdominal cramping throughout the pregnancy that she rates at 7-8/10 now. She has had some nausea with occasional vomiting and constipation. She denies vaginal discharge, diarrhea or fever. LMP was 09/28/12   OB History   Grav Para Term Preterm Abortions TAB SAB Ect Mult Living   3    2  2          Past Medical History  Diagnosis Date  . Migraine   . Gastric ulcer   . UTI (urinary tract infection)   . Infection     UTI, chronic sinus inf  . Eczema   . Ovarian cyst   . Abnormal Pap smear     Past Surgical History  Procedure Laterality Date  . Colposcopy    . Myringotomy with tube placement    . Adenoidectomy      Family History  Problem Relation Age of Onset  . Hypertension Mother   . Diabetes Father   . Cancer Paternal Aunt     female  . Diabetes Paternal Uncle   . Diabetes Paternal Grandmother     History  Substance Use Topics  . Smoking status: Former Smoker -- 0.50 packs/day    Types: Cigarettes  . Smokeless tobacco: Never Used     Comment: Aug 2014  . Alcohol Use: No    Allergies:  Allergies  Allergen Reactions  . Augmentin [Amoxicillin-Pot Clavulanate] Diarrhea and Nausea And Vomiting       . Macrobid [Nitrofurantoin Macrocrystal] Hives  . Raspberry Swelling    Throat swelling  . Vicodin [Hydrocodone-Acetaminophen] Hives    No prescriptions prior to admission    Review of Systems  Constitutional: Positive for malaise/fatigue. Negative for  fever.  Gastrointestinal: Positive for nausea, vomiting, abdominal pain and constipation. Negative for diarrhea.  Genitourinary: Negative for dysuria, urgency and frequency.       + vaginal bleeding Neg - vaginal discharge  Neurological: Negative for dizziness, loss of consciousness and weakness.   Physical Exam   Blood pressure 127/64, pulse 87, temperature 98 F (36.7 C), temperature source Oral, resp. rate 18, height 5' 3.75" (1.619 m), weight 176 lb (79.833 kg), last menstrual period 09/28/2012.  Physical Exam  Constitutional: She is oriented to person, place, and time. She appears well-developed and well-nourished. No distress.  HENT:  Head: Normocephalic and atraumatic.  Cardiovascular: Normal rate, regular rhythm and normal heart sounds.   Respiratory: Effort normal and breath sounds normal. No respiratory distress.  GI: Soft. Bowel sounds are normal. She exhibits no distension and no mass. There is tenderness (mild tenderness to palpation of the lower abdomen). There is no rebound and no guarding.  Genitourinary: Uterus is not enlarged (exam limited by maternal body habitus) and not tender. Cervix exhibits no motion tenderness, no discharge and no friability. Right adnexum displays tenderness. Right adnexum displays no mass. Left adnexum displays no mass and no tenderness. There is bleeding (scant spotting) around the vagina. Vaginal discharge (small amount  of thin, pink, malodorous discharge noted) found.  Neurological: She is alert and oriented to person, place, and time.  Skin: Skin is warm and dry. No erythema.  Psychiatric: She has a normal mood and affect.   Results for orders placed during the hospital encounter of 11/16/12 (from the past 24 hour(s))  URINALYSIS, ROUTINE W REFLEX MICROSCOPIC     Status: Abnormal   Collection Time    11/16/12  3:35 PM      Result Value Range   Color, Urine YELLOW  YELLOW   APPearance CLEAR  CLEAR   Specific Gravity, Urine >1.030 (*) 1.005  - 1.030   pH 6.0  5.0 - 8.0   Glucose, UA NEGATIVE  NEGATIVE mg/dL   Hgb urine dipstick LARGE (*) NEGATIVE   Bilirubin Urine NEGATIVE  NEGATIVE   Ketones, ur 15 (*) NEGATIVE mg/dL   Protein, ur NEGATIVE  NEGATIVE mg/dL   Urobilinogen, UA 0.2  0.0 - 1.0 mg/dL   Nitrite NEGATIVE  NEGATIVE   Leukocytes, UA NEGATIVE  NEGATIVE  URINE MICROSCOPIC-ADD ON     Status: Abnormal   Collection Time    11/16/12  3:35 PM      Result Value Range   Squamous Epithelial / LPF RARE  RARE   WBC, UA 0-2  <3 WBC/hpf   RBC / HPF 0-2  <3 RBC/hpf   Bacteria, UA FEW (*) RARE  POCT PREGNANCY, URINE     Status: Abnormal   Collection Time    11/16/12  3:43 PM      Result Value Range   Preg Test, Ur POSITIVE (*) NEGATIVE  WET PREP, GENITAL     Status: Abnormal   Collection Time    11/16/12  4:11 PM      Result Value Range   Yeast Wet Prep HPF POC NONE SEEN  NONE SEEN   Trich, Wet Prep NONE SEEN  NONE SEEN   Clue Cells Wet Prep HPF POC MODERATE (*) NONE SEEN   WBC, Wet Prep HPF POC MANY (*) NONE SEEN  CBC     Status: Abnormal   Collection Time    11/16/12  4:20 PM      Result Value Range   WBC 7.8  4.0 - 10.5 K/uL   RBC 3.74 (*) 3.87 - 5.11 MIL/uL   Hemoglobin 11.4 (*) 12.0 - 15.0 g/dL   HCT 16.1 (*) 09.6 - 04.5 %   MCV 90.9  78.0 - 100.0 fL   MCH 30.5  26.0 - 34.0 pg   MCHC 33.5  30.0 - 36.0 g/dL   RDW 40.9  81.1 - 91.4 %   Platelets 171  150 - 400 K/uL  ABO/RH     Status: None   Collection Time    11/16/12  4:20 PM      Result Value Range   ABO/RH(D) O POS    HCG, QUANTITATIVE, PREGNANCY     Status: Abnormal   Collection Time    11/16/12  4:20 PM      Result Value Range   hCG, Beta Chain, Sharene Butters, [place] [address] (*) <5 mIU/mL   US Ob Comp Less 14 Wks  11/16/2012   *RADIOLOGY REPORT*  Clinical Data: 22 year old pregnant female with bleeding. Estimated gestational age of [redacted] weeks 0 days by LMP.  OBSTETRIC <14 WK [place] AND TRANSVAGINAL OB US  Technique:  Both transabdominal and transvaginal ultrasound  examinations were performed for complete evaluation of the gestation as well as the maternal uterus, adnexal  regions, and pelvic cul-de-sac.  Transvaginal technique was performed to assess early pregnancy.  Comparison:  None  Intrauterine gestational sac:  Visualized/normal in shape. Yolk sac: Present Embryo: Present Cardiac Activity: Present Heart Rate: 111 bpm  CRL: 5.9  mm  6 w  3 d        [place] EDC: 07/09/2013  Maternal uterus/adnexae: There is no subchorionic hemorrhage. The ovaries bilaterally are unremarkable. There is no evidence of free fluid or adnexal mass.  IMPRESSION: Single living intrauterine gestation with estimated gestational age of [redacted] weeks 3 days by this ultrasound.  No evidence of subchorionic hemorrhage.   Original Report Authenticated By: Harmon Pier, M.D.   US Ob Transvaginal  11/16/2012   *RADIOLOGY REPORT*  Clinical Data: 22 year old pregnant female with bleeding. Estimated gestational age of [redacted] weeks 0 days by LMP.  OBSTETRIC <14 WK [place] AND TRANSVAGINAL OB US  Technique:  Both transabdominal and transvaginal ultrasound examinations were performed for complete evaluation of the gestation as well as the maternal uterus, adnexal regions, and pelvic cul-de-sac.  Transvaginal technique was performed to assess early pregnancy.  Comparison:  None  Intrauterine gestational sac:  Visualized/normal in shape. Yolk sac: Present Embryo: Present Cardiac Activity: Present Heart Rate: 111 bpm  CRL: 5.9  mm  6 w  3 d        [place] EDC: 07/09/2013  Maternal uterus/adnexae: There is no subchorionic hemorrhage. The ovaries bilaterally are unremarkable. There is no evidence of free fluid or adnexal mass.  IMPRESSION: Single living intrauterine gestation with estimated gestational age of [redacted] weeks 3 days by this ultrasound.  No evidence of subchorionic hemorrhage.   Original Report Authenticated By: Harmon Pier, M.D.    MAU Course  Procedures None  MDM +UPT  UA, Wet prep, GC/Chlamydia, CBC, ABO/Rh, Quant hCG and  [place] today  Assessment and Plan  A: IUP at 108w3d Vaginal bleeding in pregnancy, first trimester Bacterial vaginosis  P: Discharge home Rx for Flagyl sent to patient's pharmacy Pelvic rest advised Bleeding precautions discussed Patient advised to keep appointment to start prenatal care with [facility] as scheduled Patient may return to MAU as needed or if her condition were to change or worsen  Freddi Starr, PA-C  11/16/2012, 7:59 PM

## 2012-11-17 LAB — GC/CHLAMYDIA PROBE AMP: CT Probe RNA: NEGATIVE

## 2012-11-17 NOTE — MAU Provider Note (Signed)
Attestation of Attending Supervision of Advanced Practitioner (CNM/NP): Evaluation and management procedures were performed by the Advanced Practitioner under my supervision and collaboration.  I have reviewed the Advanced Practitioner's note and chart, and I agree with the management and plan.  Aundra Espin 11/17/2012 8:07 AM   

## 2012-11-19 ENCOUNTER — Emergency Department (HOSPITAL_BASED_OUTPATIENT_CLINIC_OR_DEPARTMENT_OTHER)
Admission: EM | Admit: 2012-11-19 | Discharge: 2012-11-19 | Disposition: A | Discharge status: Home / Self Care | Payer: BC Managed Care – PPO | Attending: Emergency Medicine | Admitting: Emergency Medicine

## 2012-11-19 ENCOUNTER — Encounter (HOSPITAL_BASED_OUTPATIENT_CLINIC_OR_DEPARTMENT_OTHER): Payer: Self-pay | Admitting: *Deleted

## 2012-11-19 DIAGNOSIS — Z87891 Personal history of nicotine dependence: Secondary | ICD-10-CM | POA: Insufficient documentation

## 2012-11-19 DIAGNOSIS — Z79899 Other long term (current) drug therapy: Secondary | ICD-10-CM | POA: Insufficient documentation

## 2012-11-19 DIAGNOSIS — R197 Diarrhea, unspecified: Secondary | ICD-10-CM | POA: Insufficient documentation

## 2012-11-19 DIAGNOSIS — Z8679 Personal history of other diseases of the circulatory system: Secondary | ICD-10-CM | POA: Insufficient documentation

## 2012-11-19 DIAGNOSIS — Z8742 Personal history of other diseases of the female genital tract: Secondary | ICD-10-CM | POA: Insufficient documentation

## 2012-11-19 DIAGNOSIS — O219 Vomiting of pregnancy, unspecified: Secondary | ICD-10-CM

## 2012-11-19 DIAGNOSIS — O9989 Other specified diseases and conditions complicating pregnancy, childbirth and the puerperium: Secondary | ICD-10-CM | POA: Insufficient documentation

## 2012-11-19 DIAGNOSIS — O210 Mild hyperemesis gravidarum: Secondary | ICD-10-CM | POA: Insufficient documentation

## 2012-11-19 DIAGNOSIS — Z87440 Personal history of urinary (tract) infections: Secondary | ICD-10-CM | POA: Insufficient documentation

## 2012-11-19 DIAGNOSIS — Z8719 Personal history of other diseases of the digestive system: Secondary | ICD-10-CM | POA: Insufficient documentation

## 2012-11-19 LAB — URINALYSIS, ROUTINE W REFLEX MICROSCOPIC
Glucose, UA: NEGATIVE mg/dL
Hgb urine dipstick: NEGATIVE
Leukocytes, UA: NEGATIVE
Protein, ur: NEGATIVE mg/dL
Specific Gravity, Urine: 1.025 (ref 1.005–1.030)
pH: 5.5 (ref 5.0–8.0)

## 2012-11-19 MED ORDER — ONDANSETRON 8 MG PO TBDP: 8.0000 mg | ORAL_TABLET | Freq: Three times a day (TID) | ORAL | Status: DC | PRN

## 2012-11-19 MED ORDER — ONDANSETRON 8 MG PO TBDP
8.0000 mg | ORAL_TABLET | Freq: Three times a day (TID) | ORAL | Status: DC | PRN
  Administered 2012-11-19: 8 mg via ORAL
  Filled 2012-11-19: qty 1

## 2012-11-19 NOTE — ED Provider Notes (Signed)
CSN: [id number]     Arrival date & time 11/19/12  4098 History   First MD Initiated Contact with Patient 11/19/12 [phone]     Chief Complaint  Patient presents with  . pregnant and vomits every day    (Consider location/radiation/quality/duration/timing/severity/associated sxs/prior Treatment) HPI Comments: G3P1 patient comes in with cc of emesis. Pt states that she has been having frequent nausea and emesis starting 1 week ago, emesis x 2 today, may be bilious. Pt also states that she has had 4 loose BM since yday. No abd pain, no cramping, no vaginal bleeding or discharge right now - on Monday, when she went to [facility] - she did have those symptoms. She denies any current dizziness, no syncope/near syncope. Pt has been trying her best to hydrate well.  The history is provided by the patient.    Past Medical History  Diagnosis Date  . Migraine   . Gastric ulcer   . UTI (urinary tract infection)   . Infection     UTI, chronic sinus inf  . Eczema   . Ovarian cyst   . Abnormal Pap smear    Past Surgical History  Procedure Laterality Date  . Colposcopy    . Myringotomy with tube placement    . Adenoidectomy     Family History  Problem Relation Age of Onset  . Hypertension Mother   . Diabetes Father   . Cancer Paternal Aunt     female  . Diabetes Paternal Uncle   . Diabetes Paternal Grandmother    History  Substance Use Topics  . Smoking status: Former Smoker -- 0.50 packs/day    Types: Cigarettes  . Smokeless tobacco: Never Used     Comment: Aug 2014  . Alcohol Use: No   OB History   Grav Para Term Preterm Abortions TAB SAB Ect Mult Living   3    2  2         Review of Systems  Constitutional: Negative for activity change and fatigue.  HENT: Negative for neck pain.   Respiratory: Negative for shortness of breath.   Cardiovascular: Negative for chest pain.  Gastrointestinal: Positive for nausea and vomiting. Negative for abdominal pain.  Genitourinary: Negative for  dysuria.  Neurological: Negative for light-headedness and headaches.    Allergies  Augmentin; Macrobid; Raspberry; and Vicodin  Home Medications   Current Outpatient Rx  Name  Route  Sig  Dispense  Refill  . calcium carbonate (TUMS - DOSED IN MG ELEMENTAL CALCIUM) 500 MG chewable tablet   Oral   Chew 2 tablets by mouth 2 (two) times daily as needed for heartburn.         . hydrocortisone cream 1 %   Topical   Apply 1 application topically 2 (two) times daily as needed (For excema.).         Marland Kitchen metroNIDAZOLE (FLAGYL) 500 MG tablet   Oral   Take 1 tablet (500 mg total) by mouth 2 (two) times daily.   14 tablet   0   . Prenatal Vit-Fe Fumarate-FA (PRENATAL MULTIVITAMIN) TABS tablet   Oral   Take 1 tablet by mouth daily at 12 noon.          BP 111/77  Pulse 72  Temp(Src) 98.4 F (36.9 C) (Oral)  Resp 16  SpO2 100%  LMP 09/28/2012 Physical Exam  Nursing note and vitals reviewed. Constitutional: She is oriented to person, place, and time. She appears well-developed and well-nourished.  HENT:  Head: Normocephalic  and atraumatic.  Eyes: EOM are normal. Pupils are equal, round, and reactive to light.  Neck: Neck supple.  Cardiovascular: Normal rate, regular rhythm and normal heart sounds.   No murmur heard. Pulmonary/Chest: Effort normal. No respiratory distress.  Abdominal: Soft. She exhibits no distension. There is no tenderness. There is no rebound and no guarding.  Neurological: She is alert and oriented to person, place, and time.  Skin: Skin is warm and dry.    ED Course  Procedures (including critical care time) Labs Review Labs Reviewed  URINALYSIS, ROUTINE W REFLEX MICROSCOPIC - Abnormal; Notable for the following:    APPearance CLOUDY (*)    All other components within normal limits   Imaging Review No results found.  MDM  No diagnosis found.  G3P1 patient comes in with cc of nausea and emesis.  Appears to be pregnancy replaced nausea and  emesis. She clinically has no signs of dehydration. Mucus membranes are moist, no tachycardia and no orthostatics. US shows no ketones. We dont think IVF and elyte check up is warranted at this time Advocated use of Pedialyte as a hydration modality. She has been tolerating po well here, and has insurance - so should be able to afford zofran.    Derwood Kaplan, MD 11/19/12 [phone]

## 2012-11-19 NOTE — ED Notes (Signed)
Pt reports she is [redacted] weeks pregnant and is having nausea and vomiting . [facility] fluids. Pt states she doesn't know if she needs to get iv fluids because she has still had vomiting. Pt has been able to keep half a bottle of water down this morning. States she has no anti nausea medication at home . She has her first OB appointment next week.

## 2012-11-23 ENCOUNTER — Encounter: Payer: Self-pay | Admitting: Advanced Practice Midwife

## 2012-11-27 ENCOUNTER — Encounter: Payer: Self-pay | Admitting: Advanced Practice Midwife

## 2012-11-27 ENCOUNTER — Ambulatory Visit (INDEPENDENT_AMBULATORY_CARE_PROVIDER_SITE_OTHER): Payer: BC Managed Care – PPO | Admitting: Advanced Practice Midwife

## 2012-11-27 VITALS — BP 114/71 | Temp 98.3°F | Wt 172.8 lb

## 2012-11-27 DIAGNOSIS — Z833 Family history of diabetes mellitus: Secondary | ICD-10-CM | POA: Insufficient documentation

## 2012-11-27 DIAGNOSIS — Z3400 Encounter for supervision of normal first pregnancy, unspecified trimester: Secondary | ICD-10-CM | POA: Insufficient documentation

## 2012-11-27 DIAGNOSIS — Z87891 Personal history of nicotine dependence: Secondary | ICD-10-CM

## 2012-11-27 DIAGNOSIS — Z8679 Personal history of other diseases of the circulatory system: Secondary | ICD-10-CM

## 2012-11-27 DIAGNOSIS — Z3401 Encounter for supervision of normal first pregnancy, first trimester: Secondary | ICD-10-CM

## 2012-11-27 DIAGNOSIS — K5900 Constipation, unspecified: Secondary | ICD-10-CM

## 2012-11-27 DIAGNOSIS — A499 Bacterial infection, unspecified: Secondary | ICD-10-CM

## 2012-11-27 DIAGNOSIS — R110 Nausea: Secondary | ICD-10-CM

## 2012-11-27 DIAGNOSIS — Z8489 Family history of other specified conditions: Secondary | ICD-10-CM

## 2012-11-27 DIAGNOSIS — N760 Acute vaginitis: Secondary | ICD-10-CM

## 2012-11-27 DIAGNOSIS — Z3201 Encounter for pregnancy test, result positive: Secondary | ICD-10-CM

## 2012-11-27 DIAGNOSIS — B9689 Other specified bacterial agents as the cause of diseases classified elsewhere: Secondary | ICD-10-CM

## 2012-11-27 HISTORY — DX: Family history of diabetes mellitus: Z83.3

## 2012-11-27 LAB — POCT URINALYSIS DIPSTICK
Nitrite, UA: NEGATIVE
Protein, UA: NEGATIVE
pH, UA: 5

## 2012-11-27 MED ORDER — METRONIDAZOLE 0.75 % VA GEL: 1.0000 | Freq: Every day | VAGINAL | Status: AC

## 2012-11-27 NOTE — Progress Notes (Signed)
Pulse- 83 . Subjective:    GLENNETTE [name] is being seen today for her first obstetrical visit.  This is a planned pregnancy. She is at [redacted]w[redacted]d gestation. Her obstetrical history is significant for none. Relationship with FOB: spouse, living together. Patient does intend to breast feed. Pregnancy history fully reviewed.  Menstrual History: OB History   Grav Para Term Preterm Abortions TAB SAB Ect Mult Living   3    2  2          Menarche age: 22  Patient's last menstrual period was 09/28/2012.    The following portions of the patient's history were reviewed and updated as appropriate: allergies, current medications, past family history, past medical history, past social history, past surgical history and problem list.  Patient reports an incident when she was in high school and had been very sick with ear infections for 1 month and was on prednisone. She began to have neurological symptoms, imaging concerning for stroke vs aneurysm. Follow up imaging showed complete resolution. Patient seen and evaluated at [facility]. Was never given an official diagnosis.  Keitha has had a recent pap and breast exam.   Review of Systems A comprehensive review of systems was negative except for: Gastrointestinal: positive for constipation and nausea Genitourinary: positive for vaginal discharge   No relief of nausea with zofran. Patient nauseas x2 weeks, no relief w/ docusate sodium.  Objective:    BP 114/71  Temp(Src) 98.3 F (36.8 C)  Wt 172 lb 12.8 oz (78.382 kg)  BMI 29.9 kg/m2  LMP 09/28/2012  General Appearance:    Alert, cooperative, no distress, appears stated age  Head:    Normocephalic, without obvious abnormality, atraumatic  Eyes:    PERRL, conjunctiva/corneas clear, EOM's intact, fundi    benign, both eyes  Ears:    Normal TM's and external ear canals, both ears  Nose:   Nares normal, septum midline, mucosa normal, no drainage    or sinus tenderness  Throat:   Lips, mucosa, and tongue  normal; teeth and gums normal  Neck:   Supple, symmetrical, trachea midline, no adenopathy;    thyroid:  no enlargement/tenderness/nodules; no carotid   bruit or JVD  Back:     Symmetric, no curvature, ROM normal, no CVA tenderness  Lungs:     Clear to auscultation bilaterally, respirations unlabored  Chest Wall:    No tenderness or deformity   Heart:    Regular rate and rhythm, S1 and S2 normal, no murmur, rub   or gallop     Abdomen:     Soft, non-tender, bowel sounds active all four quadrants,    no masses, no organomegaly        Extremities:   Extremities normal, atraumatic, no cyanosis or edema  Pulses:   2+ and symmetric all extremities  Skin:   Skin color, texture, turgor normal, no rashes or lesions  Lymph nodes:   Cervical, supraclavicular, and axillary nodes normal  Neurologic:   CNII-XII intact, normal strength, sensation and reflexes    throughout      Assessment:    Pregnancy at [redacted]w[redacted]d weeks   History of Aneurysm vs Stroke as teenager w/ complete resolution. Records requested. Plan MFM consult. Patient denies family history of  Coagulopathy disorder Severe Consitpation Nausea    Plan:    Initial labs drawn. Prenatal vitamins.  Counseling provided regarding continued use of seat belts, cessation of alcohol consumption, smoking or use of illicit drugs; infection precautions i.e., influenza/TDAP immunizations, toxoplasmosis,CMV,  parvovirus, listeria and varicella; workplace safety, exercise during pregnancy; routine dental care, safe medications, sexual activity, hot tubs, saunas, pools, travel, caffeine use, fish and methlymercury, potential toxins, hair treatments, varicose veins Weight gain recommendations reviewed: underweight/BMI< 18.5--> gain 28 - 40 lbs; normal weight/BMI 18.5 - 24.9--> gain 25 - 35 lbs; overweight/BMI 25 - 29.9--> gain 15 - 25 lbs; obese/BMI >30->gain  11 - 20 lbs Problem list reviewed and updated. AFP3 discussed: requested. Plan after 14  weeks Role of ultrasound in pregnancy discussed; fetal survey: results reviewed. Amniocentesis discussed: not indicated. Follow up in 4 weeks. Diclegis given for nausea, consider Rx if patient experiences relief. Patient to try milk of mag for constipation, if no relief consider enema. Encouraged patient to eat a high fiber diet, hydrate and avoid constipation. Told patient to only utilize enema if nothing else is working. Patient to contact us if no relief.  Pelvic, pap GC/CT not indicated today, recently performed.  80% of 40 min visit spent on counseling and coordination of care.   Amy Wilson Singer CNM

## 2012-11-28 LAB — OBSTETRIC PANEL
Antibody Screen: NEGATIVE
Basophils Absolute: 0 10*3/uL (ref 0.0–0.1)
HCT: 35.6 % — ABNORMAL LOW (ref 36.0–46.0)
Hemoglobin: 12.3 g/dL (ref 12.0–15.0)
Lymphocytes Relative: 23 % (ref 12–46)
Lymphs Abs: 1.5 10*3/uL (ref 0.7–4.0)
MCV: 90.4 fL (ref 78.0–100.0)
Monocytes Absolute: 0.5 10*3/uL (ref 0.1–1.0)
Neutro Abs: 4.6 10*3/uL (ref 1.7–7.7)
RBC: 3.94 MIL/uL (ref 3.87–5.11)
RDW: 14.1 % (ref 11.5–15.5)
Rubella: 2.22 Index — ABNORMAL HIGH (ref <0.90)
WBC: 6.7 10*3/uL (ref 4.0–10.5)

## 2012-11-28 LAB — CULTURE, OB URINE: Colony Count: 45000

## 2012-12-01 ENCOUNTER — Encounter: Payer: Self-pay | Admitting: Advanced Practice Midwife

## 2012-12-08 ENCOUNTER — Other Ambulatory Visit: Payer: Self-pay | Admitting: *Deleted

## 2012-12-08 DIAGNOSIS — O219 Vomiting of pregnancy, unspecified: Secondary | ICD-10-CM

## 2012-12-08 MED ORDER — DOXYLAMINE-PYRIDOXINE 10-10 MG PO TBEC: 1.0000 | DELAYED_RELEASE_TABLET | ORAL | Status: DC

## 2012-12-24 ENCOUNTER — Ambulatory Visit (INDEPENDENT_AMBULATORY_CARE_PROVIDER_SITE_OTHER): Payer: BC Managed Care – PPO | Admitting: Obstetrics

## 2012-12-24 ENCOUNTER — Encounter: Payer: Self-pay | Admitting: Obstetrics

## 2012-12-24 VITALS — BP 118/77 | Temp 98.2°F | Wt 171.8 lb

## 2012-12-24 DIAGNOSIS — N760 Acute vaginitis: Secondary | ICD-10-CM

## 2012-12-24 DIAGNOSIS — A499 Bacterial infection, unspecified: Secondary | ICD-10-CM

## 2012-12-24 DIAGNOSIS — B9689 Other specified bacterial agents as the cause of diseases classified elsewhere: Secondary | ICD-10-CM

## 2012-12-24 DIAGNOSIS — Z3401 Encounter for supervision of normal first pregnancy, first trimester: Secondary | ICD-10-CM

## 2012-12-24 DIAGNOSIS — Z3400 Encounter for supervision of normal first pregnancy, unspecified trimester: Secondary | ICD-10-CM

## 2012-12-24 LAB — POCT URINALYSIS DIPSTICK
Blood, UA: NEGATIVE
Nitrite, UA: NEGATIVE
Urobilinogen, UA: NEGATIVE
pH, UA: 6

## 2012-12-24 MED ORDER — CLINDAMYCIN HCL 300 MG PO CAPS: 300.0000 mg | ORAL_CAPSULE | Freq: Three times a day (TID) | ORAL | Status: DC

## 2012-12-24 NOTE — Progress Notes (Signed)
Pulse- 78.  Clear abnormal discharge with an odor.  Patient states that she still has trouble with constipation.

## 2013-01-12 ENCOUNTER — Inpatient Hospital Stay (HOSPITAL_COMMUNITY)
Admission: AD | Admit: 2013-01-12 | Discharge: 2013-01-12 | Disposition: A | Discharge status: Home / Self Care | Payer: BC Managed Care – PPO | Source: Ambulatory Visit | Attending: Obstetrics | Admitting: Obstetrics

## 2013-01-12 ENCOUNTER — Encounter (HOSPITAL_COMMUNITY): Payer: Self-pay

## 2013-01-12 DIAGNOSIS — R109 Unspecified abdominal pain: Secondary | ICD-10-CM | POA: Insufficient documentation

## 2013-01-12 DIAGNOSIS — N72 Inflammatory disease of cervix uteri: Secondary | ICD-10-CM

## 2013-01-12 DIAGNOSIS — N949 Unspecified condition associated with female genital organs and menstrual cycle: Secondary | ICD-10-CM | POA: Insufficient documentation

## 2013-01-12 DIAGNOSIS — O2390 Unspecified genitourinary tract infection in pregnancy, unspecified trimester: Secondary | ICD-10-CM | POA: Insufficient documentation

## 2013-01-12 LAB — URINALYSIS, ROUTINE W REFLEX MICROSCOPIC
Bilirubin Urine: NEGATIVE
Glucose, UA: NEGATIVE mg/dL
Hgb urine dipstick: NEGATIVE
Ketones, ur: NEGATIVE mg/dL
Leukocytes, UA: NEGATIVE
Nitrite: NEGATIVE
Protein, ur: NEGATIVE mg/dL
Urobilinogen, UA: 0.2 mg/dL (ref 0.0–1.0)

## 2013-01-12 LAB — WET PREP, GENITAL: Yeast Wet Prep HPF POC: NONE SEEN

## 2013-01-12 MED ORDER — ACETAMINOPHEN 500 MG PO TABS
1000.0000 mg | ORAL_TABLET | Freq: Once | ORAL | Status: AC
  Administered 2013-01-12: 1000 mg via ORAL
  Filled 2013-01-12: qty 2

## 2013-01-12 MED ORDER — CEFTRIAXONE SODIUM 250 MG IJ SOLR
250.0000 mg | Freq: Once | INTRAMUSCULAR | Status: AC
  Administered 2013-01-12: 250 mg via INTRAMUSCULAR
  Filled 2013-01-12: qty 250

## 2013-01-12 MED ORDER — ONDANSETRON 8 MG PO TBDP
8.0000 mg | ORAL_TABLET | Freq: Once | ORAL | Status: AC
  Administered 2013-01-12: 8 mg via ORAL
  Filled 2013-01-12: qty 1

## 2013-01-12 MED ORDER — AZITHROMYCIN 250 MG PO TABS
1000.0000 mg | ORAL_TABLET | Freq: Once | ORAL | Status: AC
  Administered 2013-01-12: 1000 mg via ORAL
  Filled 2013-01-12: qty 4

## 2013-01-12 NOTE — MAU Provider Note (Signed)
History     CSN: [id number]  Arrival date and time: 01/12/13 1434   First Provider Initiated Contact with Patient 01/12/13 1547      Chief Complaint  Patient presents with  . pelvic pressure   . Abdominal Cramping   HPI Pt is G3P0020 and complains of lower abdominal pain that is constant.  The pain is less when she is lying down and  Worse when she is sitting.  Pt denies abnormal vaginal discharge; UTI sx or diarrhea.  Pt is having some  Difficulty with constipation.  Pt had a normal bowel movement this morning.    Past Medical History  Diagnosis Date  . Migraine   . Gastric ulcer   . UTI (urinary tract infection)   . Infection     UTI, chronic sinus inf  . Eczema   . Ovarian cyst   . Abnormal Pap smear     Past Surgical History  Procedure Laterality Date  . Colposcopy    . Myringotomy with tube placement    . Adenoidectomy      Family History  Problem Relation Age of Onset  . Hypertension Mother   . Diabetes Father   . Cancer Paternal Aunt     female  . Diabetes Paternal Uncle   . Diabetes Paternal Grandmother     History  Substance Use Topics  . Smoking status: Former Smoker -- 0.50 packs/day    Types: Cigarettes    Quit date: 11/19/2012  . Smokeless tobacco: Never Used     Comment: Aug 2014  . Alcohol Use: No    Allergies:  Allergies  Allergen Reactions  . Augmentin [Amoxicillin-Pot Clavulanate] Diarrhea and Nausea And Vomiting       . Macrobid [Nitrofurantoin Macrocrystal] Hives  . Raspberry Swelling    Throat swelling  . Vicodin [Hydrocodone-Acetaminophen] Hives    Prescriptions prior to admission  Medication Sig Dispense Refill  . acetaminophen (TYLENOL) 325 MG tablet Take 325-650 mg by mouth every 6 (six) hours as needed for pain (For headache.).      Marland Kitchen calcium carbonate (TUMS - DOSED IN MG ELEMENTAL CALCIUM) 500 MG chewable tablet Chew 2 tablets by mouth 2 (two) times daily as needed for heartburn.      . Doxylamine-Pyridoxine  (DICLEGIS) 10-10 MG TBEC Take 1 tablet by mouth as directed. 1 tab in AM, 1 tab mid afternoon 2 tabs at bedtime. Max dose 4 tabs daily.  100 tablet  2  . Prenatal Vit-Fe Fumarate-FA (PRENATAL MULTIVITAMIN) TABS tablet Take 1 tablet by mouth daily at 12 noon.      Marland Kitchen tetrahydrozoline 0.05 % ophthalmic solution Place 1 drop into both eyes daily as needed (For dryness.).        ROS Physical Exam   Blood pressure 130/78, pulse 103, temperature 98.4 F (36.9 C), temperature source Oral, resp. rate 18, height 5\' 4"  (1.626 m), weight 75.751 kg (167 lb), last menstrual period 09/28/2012.  Physical Exam  Constitutional: She is oriented to person, place, and time. She appears well-developed and well-nourished. No distress.  HENT:  Head: Normocephalic.  Eyes: Pupils are equal, round, and reactive to light.  Neck: Normal range of motion. Neck supple.  Cardiovascular: Normal rate.   Respiratory: Effort normal.  GI: Soft. She exhibits no distension. There is no tenderness. There is no rebound and no guarding.  FHR audible with doppler  Genitourinary:  Cervix closed; mildly tender; uterus gravid slightly tender Adnexa without palpable enlargement or tenderness; minimal discharge in  vault  Musculoskeletal: Normal range of motion.  Neurological: She is alert and oriented to person, place, and time.  Skin: Skin is warm and dry.  Psychiatric: She has a normal mood and affect.    MAU Course  Procedures Results for orders placed during the hospital encounter of 01/12/13 (from the past 24 hour(s))  URINALYSIS, ROUTINE W REFLEX MICROSCOPIC     Status: Abnormal   Collection Time    01/12/13  3:05 PM      Result Value Range   Color, Urine YELLOW  YELLOW   APPearance CLEAR  CLEAR   Specific Gravity, Urine >1.030 (*) 1.005 - 1.030   pH 6.0  5.0 - 8.0   Glucose, UA NEGATIVE  NEGATIVE mg/dL   Hgb urine dipstick NEGATIVE  NEGATIVE   Bilirubin Urine NEGATIVE  NEGATIVE   Ketones, ur NEGATIVE  NEGATIVE  mg/dL   Protein, ur NEGATIVE  NEGATIVE mg/dL   Urobilinogen, UA 0.2  0.0 - 1.0 mg/dL   Nitrite NEGATIVE  NEGATIVE   Leukocytes, UA NEGATIVE  NEGATIVE  WET PREP, GENITAL     Status: Abnormal   Collection Time    01/12/13  4:00 PM      Result Value Range   Yeast Wet Prep HPF POC NONE SEEN  NONE SEEN   Trich, Wet Prep NONE SEEN  NONE SEEN   Clue Cells Wet Prep HPF POC NONE SEEN  NONE SEEN   WBC, Wet Prep HPF POC MODERATE (*) NONE SEEN  tylenol given for abd pain with improvement Will treat for cervicitis since pt was tender with exam Rocephin 250mg  IM and Zithromax 1 gm given Pt nauseated after antibiotic zofran ordered Nausea subsided with zofran  Assessment and Plan  abd pain in pregnancy Cervicitis F/u with Dr. Clearance Coots for [facility] appointment  [facility] 01/12/2013, 3:50 PM

## 2013-01-12 NOTE — MAU Note (Signed)
For past wk has been having pelvic pressure.  Feels like needs to push something out, though has had a recent bm.

## 2013-01-14 LAB — GC/CHLAMYDIA PROBE AMP
CT Probe RNA: NEGATIVE
GC Probe RNA: NEGATIVE

## 2013-01-21 ENCOUNTER — Encounter: Payer: Self-pay | Admitting: Obstetrics & Gynecology

## 2013-01-21 ENCOUNTER — Ambulatory Visit (INDEPENDENT_AMBULATORY_CARE_PROVIDER_SITE_OTHER): Payer: BC Managed Care – PPO | Admitting: Obstetrics & Gynecology

## 2013-01-21 ENCOUNTER — Encounter: Payer: BC Managed Care – PPO | Admitting: Obstetrics & Gynecology

## 2013-01-21 VITALS — BP 124/78 | Temp 97.5°F | Wt 174.0 lb

## 2013-01-21 DIAGNOSIS — Z3400 Encounter for supervision of normal first pregnancy, unspecified trimester: Secondary | ICD-10-CM

## 2013-01-21 DIAGNOSIS — Z3402 Encounter for supervision of normal first pregnancy, second trimester: Secondary | ICD-10-CM

## 2013-01-21 LAB — POCT URINALYSIS DIPSTICK
Glucose, UA: NEGATIVE
Nitrite, UA: NEGATIVE
Spec Grav, UA: 1.025
Urobilinogen, UA: NEGATIVE

## 2013-01-21 NOTE — Patient Instructions (Signed)
Alpha-Fetoprotein Alpha-Fetoprotein (AFP) is a protein made by the fetal liver. It is normally elevated in the newborn and the mother. In the infant, the value falls to adult values (normally less than 20 ng/ml (nanograms per milliliter) by one year of age. This protein is found in a number of abnormal tumors and conditions. It can be used for a screening test when this is appropriate. PREPARATION FOR TEST No preparation or fasting is required. ELEVATIONS OF AFP ARE CAUSED BY:  Primary hepatocellular carcinoma (cancer of the liver) and the level correlates with tumor size.  A screening test for embryonic teratocarcinomas, hepatoblastomas (tumor of the liver).  Rarely hepatic metastases (cancer spread) from the GI tract.  Some cholangiocarcinomas cause elevations greater than 400 ng/ml.  Rapidly progressing hepatitis can produce levels of AFP in excess of 1000ng/ml.  Lesser levels of hepatitis (inflammation of the liver) can produce levels of 100 to 400 ng/ml. NORMAL FINDINGS   Adult: Less than 40 ng/mL or Less than 40 mg/L (SI units).  Child younger than 1 year: Less than 30ng/mL. Ranges are stratified by weeks of gestation and vary among laboratories. Ranges for normal findings may vary among different laboratories and hospitals. You should always check with your doctor after having lab work or other tests done to discuss the meaning of your test results and whether your values are considered within normal limits. MEANING OF TEST  Your caregiver will go over the test results with you and discuss the importance and meaning of your results, as well as treatment options and the need for additional tests if necessary. OBTAINING THE TEST RESULTS  It is your responsibility to obtain your test results. Ask the lab or department performing the test when and how you will get your results. Document Released: 03/28/2004 Document Revised: 05/27/2011 Document Reviewed: 02/07/2008 [facility] Patient  Information 2014 [place], [place]. CF Gene Mutation Testing This is a test used to detect cystic fibrosis (CF) genetic mutations to establish CF carrier status or to establish the diagnosis of CF in an individual. The CF gene mutation test identifies mutations in the CFTR gene on chromosome 7. Each cell in the human body (except sperm and eggs) has 46 chromosomes (23 inherited from the mother and 23 from the father). Genes on these chromosomes form the body's blueprint for producing proteins that control body functions. Cystic fibrosis is caused by a mutation in a pair of genes located on chromosomes 7. Both copies of this gene must be abnormal to cause CF. If only one copy of the gene pair is mutated, the patient will be a carrier. Carriers are not ill, they do not have any symptoms, but they can pass their abnormal CF gene copy on to their children.  When a newborn infant has meconium ileus (no stools in the first 24 to 48 hours of life) or when a person has symptoms of CF (salty sweat, persistent respiratory infections, wheezing, persistent diarrhea, foul-smelling greasy stools, malnutrition, and vitamin deficiency); if a person has a positive sweat chloride or IRT test or a close relative who has been diagnosed with CF; when a patient is undergoing genetic counseling and wants to find out if they are a CF carrier; or for prenatal diagnosis. PREPARATION FOR TEST A blood sample drawn from an infant's heel; a spot of blood that is put onto filter paper; or a blood sample is drawn from a vein in the arm. NORMAL FINDINGS No genetic mutation. Ranges for normal findings may vary among different laboratories  and hospitals. You should always check with your doctor after having lab work or other tests done to discuss the meaning of your test results and whether your values are considered within normal limits. MEANING OF TEST Your caregiver will go over the test results with you and discuss the importance and  meaning of your results, as well as treatment options and the need for additional tests if necessary. OBTAINING THE TEST RESULTS It is your responsibility to obtain your test results. Ask the lab or department performing the test when and how you will get your results. Document Released: 03/28/2004 Document Revised: 05/27/2011 Document Reviewed: 02/10/2008 [facility] Patient Information 2014 [place], [place].

## 2013-01-21 NOTE — Progress Notes (Signed)
Pulse-90 Pt states she is having pressure and discomfort in her thighs.  CF mutation/quad screen today.  ?TMJ symptoms.  Recommend routine dental care.  Eczema on abdomen/flank.

## 2013-01-22 LAB — AFP, QUAD SCREEN
AFP: 30.5 IU/mL
Curr Gest Age: 15.6 wks.days
Down Syndrome Scr Risk Est: 1:23600 {titer}
HCG, Total: 15675 m[IU]/mL
INH: 244.7 pg/mL
Interpretation-AFP: NEGATIVE
MoM for AFP: 1.12
MoM for INH: 1.46
Open Spina bifida: NEGATIVE
Osb Risk: 1:8870 {titer}
uE3 Mom: 0.6
uE3 Value: 0.3 ng/mL

## 2013-02-01 ENCOUNTER — Other Ambulatory Visit: Payer: Self-pay | Admitting: *Deleted

## 2013-02-01 DIAGNOSIS — Z0489 Encounter for examination and observation for other specified reasons: Secondary | ICD-10-CM

## 2013-02-03 ENCOUNTER — Encounter: Payer: Self-pay | Admitting: Obstetrics & Gynecology

## 2013-02-03 ENCOUNTER — Ambulatory Visit (INDEPENDENT_AMBULATORY_CARE_PROVIDER_SITE_OTHER): Payer: BC Managed Care – PPO

## 2013-02-03 DIAGNOSIS — Z0489 Encounter for examination and observation for other specified reasons: Secondary | ICD-10-CM

## 2013-02-03 DIAGNOSIS — Z1389 Encounter for screening for other disorder: Secondary | ICD-10-CM

## 2013-02-03 LAB — US OB DETAIL + 14 WK

## 2013-02-18 ENCOUNTER — Encounter: Payer: BC Managed Care – PPO | Admitting: Obstetrics & Gynecology

## 2013-02-25 ENCOUNTER — Other Ambulatory Visit: Payer: Self-pay | Admitting: *Deleted

## 2013-02-25 ENCOUNTER — Inpatient Hospital Stay (HOSPITAL_COMMUNITY)
Admission: AD | Admit: 2013-02-25 | Discharge: 2013-02-25 | Disposition: A | Discharge status: Home / Self Care | Payer: BC Managed Care – PPO | Source: Ambulatory Visit | Attending: Obstetrics | Admitting: Obstetrics

## 2013-02-25 ENCOUNTER — Encounter (HOSPITAL_COMMUNITY): Payer: Self-pay | Admitting: *Deleted

## 2013-02-25 ENCOUNTER — Ambulatory Visit (INDEPENDENT_AMBULATORY_CARE_PROVIDER_SITE_OTHER): Payer: BC Managed Care – PPO | Admitting: Obstetrics & Gynecology

## 2013-02-25 VITALS — BP 120/75 | Temp 98.2°F | Wt 172.0 lb

## 2013-02-25 DIAGNOSIS — R0602 Shortness of breath: Secondary | ICD-10-CM | POA: Insufficient documentation

## 2013-02-25 DIAGNOSIS — O99019 Anemia complicating pregnancy, unspecified trimester: Secondary | ICD-10-CM | POA: Insufficient documentation

## 2013-02-25 DIAGNOSIS — Z3400 Encounter for supervision of normal first pregnancy, unspecified trimester: Secondary | ICD-10-CM

## 2013-02-25 DIAGNOSIS — Z87898 Personal history of other specified conditions: Secondary | ICD-10-CM

## 2013-02-25 DIAGNOSIS — D649 Anemia, unspecified: Secondary | ICD-10-CM

## 2013-02-25 DIAGNOSIS — Z3402 Encounter for supervision of normal first pregnancy, second trimester: Secondary | ICD-10-CM

## 2013-02-25 DIAGNOSIS — O99891 Other specified diseases and conditions complicating pregnancy: Secondary | ICD-10-CM | POA: Insufficient documentation

## 2013-02-25 LAB — COMPREHENSIVE METABOLIC PANEL
ALT: 8 U/L (ref 0–35)
AST: 11 U/L (ref 0–37)
Albumin: 3.1 g/dL — ABNORMAL LOW (ref 3.5–5.2)
Alkaline Phosphatase: 79 U/L (ref 39–117)
BUN: 7 mg/dL (ref 6–23)
CO2: 23 mEq/L (ref 19–32)
Chloride: 104 mEq/L (ref 96–112)
Creatinine, Ser: 0.54 mg/dL (ref 0.50–1.10)
GFR calc Af Amer: >90 mL/min (ref >90)
GFR calc non Af Amer: >90 mL/min (ref >90)
Glucose, Bld: 80 mg/dL (ref 70–99)
Potassium: 3.8 mEq/L (ref 3.5–5.1)
Total Bilirubin: 0.2 mg/dL — ABNORMAL LOW (ref 0.3–1.2)

## 2013-02-25 LAB — POCT URINALYSIS DIPSTICK
Bilirubin, UA: NEGATIVE
Blood, UA: NEGATIVE
Glucose, UA: NEGATIVE
Spec Grav, UA: 1.02
Urobilinogen, UA: NEGATIVE
pH, UA: 5

## 2013-02-25 LAB — URINALYSIS, ROUTINE W REFLEX MICROSCOPIC
Bilirubin Urine: NEGATIVE
Glucose, UA: NEGATIVE mg/dL
Hgb urine dipstick: NEGATIVE
Leukocytes, UA: NEGATIVE
Specific Gravity, Urine: >1.03 — ABNORMAL HIGH (ref 1.005–1.030)

## 2013-02-25 LAB — CBC
HCT: 30.6 % — ABNORMAL LOW (ref 36.0–46.0)
MCV: 91.9 fL (ref 78.0–100.0)
Platelets: 180 10*3/uL (ref 150–400)
RBC: 3.33 MIL/uL — ABNORMAL LOW (ref 3.87–5.11)
WBC: 6.9 10*3/uL (ref 4.0–10.5)

## 2013-02-25 NOTE — MAU Provider Note (Signed)
History     CSN: [id number]  Arrival date and time: 02/25/13 1004   First Provider Initiated Contact with Patient 02/25/13 1029      Chief Complaint  Patient presents with  . Shortness of Breath   HPIpt is [redacted]w[redacted]d pregnant G3P0020 who was sent from the office today with c/o SOB.  Pt states it occurs sometimes with walking and is Worse in the evening. O2SAT in office was 98% Pulse 80 Pt denies cough or wheezing-" may be trying to get a cold"; pt denies nausea or vomiting- ate last night - has not eaten this morning. Pt is not having shortness of breath at this time; denies chest pain  RN note: Reita Chard, RN Registered Nurse Signed  MAU Note Service date: 02/25/2013 10:26 AM   Pt was seen in the office today and was sent in for SOB. Normally more in the evening time.     Past Medical History  Diagnosis Date  . Migraine   . Gastric ulcer   . UTI (urinary tract infection)   . Infection     UTI, chronic sinus inf  . Eczema   . Ovarian cyst   . Abnormal Pap smear     Past Surgical History  Procedure Laterality Date  . Colposcopy    . Myringotomy with tube placement    . Adenoidectomy      Family History  Problem Relation Age of Onset  . Hypertension Mother   . Diabetes Father   . Cancer Paternal Aunt     female  . Diabetes Paternal Uncle   . Diabetes Paternal Grandmother     History  Substance Use Topics  . Smoking status: Former Smoker -- 0.50 packs/day    Types: Cigarettes    Quit date: 11/19/2012  . Smokeless tobacco: Never Used     Comment: Aug 2014  . Alcohol Use: No    Allergies:  Allergies  Allergen Reactions  . Augmentin [Amoxicillin-Pot Clavulanate] Diarrhea and Nausea And Vomiting       . Macrobid [Nitrofurantoin Macrocrystal] Hives  . Raspberry Swelling    Throat swelling  . Vicodin [Hydrocodone-Acetaminophen] Hives    Prescriptions prior to admission  Medication Sig Dispense Refill  . acetaminophen (TYLENOL) 325 MG tablet Take  325-650 mg by mouth every 6 (six) hours as needed for pain (For headache.).      Marland Kitchen calcium carbonate (TUMS - DOSED IN MG ELEMENTAL CALCIUM) 500 MG chewable tablet Chew 2 tablets by mouth 2 (two) times daily as needed for heartburn.      . folic acid (FOLVITE) 400 MCG tablet Take 400 mcg by mouth daily.      . hydrocortisone cream 1 % Apply 1 application topically as needed for itching (skin itching).      . Multiple Vitamins-Minerals (MULTIVITAMIN WITH MINERALS) tablet Take 1 tablet by mouth daily.      Marland Kitchen tetrahydrozoline 0.05 % ophthalmic solution Place 1 drop into both eyes daily as needed (For dryness.).        Review of Systems  Constitutional: Negative for fever and chills.  Gastrointestinal: Negative for nausea, vomiting, abdominal pain, diarrhea and constipation.  Genitourinary: Negative for dysuria and urgency.  Neurological: Negative for headaches.   Physical Exam   Blood pressure 105/62, pulse 76, temperature 98.4 F (36.9 C), temperature source Oral, resp. rate 18, height 5\' 4"  (1.626 m), weight 78.019 kg (172 lb), last menstrual period 09/28/2012, SpO2 98.00%.  Physical Exam  Nursing note and vitals  reviewed. Constitutional: She is oriented to person, place, and time. She appears well-developed and well-nourished. No distress.  HENT:  Head: Normocephalic.  Eyes: Pupils are equal, round, and reactive to light.  Neck: Normal range of motion. Neck supple.  Cardiovascular: Normal rate.   Respiratory: Effort normal and breath sounds normal. No respiratory distress.  SpO2 98% on room air  GI: Soft.  Neurological: She is alert and oriented to person, place, and time.  Skin: Skin is warm and dry.  Psychiatric: She has a normal mood and affect.    MAU Course  Procedures Results for orders placed during the hospital encounter of 02/25/13 (from the past 24 hour(s))  URINALYSIS, ROUTINE W REFLEX MICROSCOPIC     Status: Abnormal   Collection Time    02/25/13 10:10 AM       Result Value Range   Color, Urine YELLOW  YELLOW   APPearance CLEAR  CLEAR   Specific Gravity, Urine >1.030 (*) 1.005 - 1.030   pH 5.5  5.0 - 8.0   Glucose, UA NEGATIVE  NEGATIVE mg/dL   Hgb urine dipstick NEGATIVE  NEGATIVE   Bilirubin Urine NEGATIVE  NEGATIVE   Ketones, ur 15 (*) NEGATIVE mg/dL   Protein, ur NEGATIVE  NEGATIVE mg/dL   Urobilinogen, UA 0.2  0.0 - 1.0 mg/dL   Nitrite NEGATIVE  NEGATIVE   Leukocytes, UA NEGATIVE  NEGATIVE  CBC     Status: Abnormal   Collection Time    02/25/13 10:50 AM      Result Value Range   WBC 6.9  4.0 - 10.5 K/uL   RBC 3.33 (*) 3.87 - 5.11 MIL/uL   Hemoglobin 10.2 (*) 12.0 - 15.0 g/dL   HCT 16.1 (*) 09.6 - 04.5 %   MCV 91.9  78.0 - 100.0 fL   MCH 30.6  26.0 - 34.0 pg   MCHC 33.3  30.0 - 36.0 g/dL   RDW 40.9  81.1 - 91.4 %   Platelets 180  150 - 400 K/uL  discussed with Dr. Clearance Coots- pt may d/c home  Assessment and Plan  SOB in pregnancy Anemia- advised to try to restart Prenatal vitamins Discussed making sure she is drinking plenty of fluids Near syncope instructions F/u with Dr. Clearance Coots for next appointment- sooner if increase in sx  LINEBERRY,SUSAN 02/25/2013, 10:53 AM

## 2013-02-25 NOTE — Patient Instructions (Signed)
Segundo trimestre del embarazo  (Second Trimester of Pregnancy) El segundo trimestre del embarazo se extiende desde la semana 13 hasta la semana 28, del 4 al 6 mes. En general, es el momento del embarazo en el que se sentir mejor. Su organismo se ha adaptado a estar embarazada y comienza a sentirse fsicamente mejor. En general las nuseas matutinas han disminuido o han desaparecido completamente. El segundo trimestre es tambin la poca en la que el feto se desarrolla rpidamente. Hacia el final del sexto mes, el beb mide aproximadamente 9 pulgadas (23 cm) y pesa alrededor de 1  libras (700 g). Es probable que sienta mover al beb (dar pataditas) entre las 18 y 20 semanas del embarazo.  CAMBIOS CORPORALES  Su organismo atravesar numerosos cambios durante el embarazo. Los cambios varan de una mujer a otra.   Seguir aumentando de peso. Notar que la parte baja del abdomen se ensancha.  Podrn aparecer las primeras estras en las caderas, abdomen y mamas.  Es posible que sienta cefaleas, que se pueden aliviar con los medicamentos que su mdico le autorice a utilizar.  Tendr necesidad de orinar con ms frecuencia porque el feto est presionando en la vejiga.  Como consecuencia del embarazo, podr sentir acidez estomacal continuamente.  Podr estar constipada ya que ciertas hormonas hacen que los msculos que empujan los desechos a travs de los intestinos trabajen ms lentamente.  Pueden aparecer hemorroides o abultarse las venas (venas varicosas).  El dolor de espalda se debe al aumento de peso y a que las hormonas del embarazo relajan las articulaciones entre los huesos de la pelvis y a la modificacin del peso y a los msculos que soportan el equilibrio.  Sus mamas seguirn desarrollndose y estarn ms sensibles.  Las encas pueden sangrar y estar sensibles al cepillado y al hilo dental.  Pueden aparecer en el rostro zonas oscuras o manchas (cloasma, mscara del embarazo). Esto  desaparece despus del nacimiento del beb.  Es posible que se formeuna lnea oscura desde el ombligo a la zona del pubis (linea nigra). Esto desaparece despus del nacimiento del beb. QU DEBE ESPERAR EN LAS CONSULTAS PRENATALES  Durante una visita prenatal de rutina:   La pesarn para verificar que usted y el feto se encuentran dentro de los lmites normales.  Le tomarn la presin arterial.  Le medirn el abdomen para verificar el desarrollo del beb.  Escucharn los latidos fetales.  Se evaluarn los resultados de los estudios realizados en visitas anteriores. El mdico le preguntar:   Cmo se siente.  Si siente los movimientos del beb.  Si tiene sntomas anormales, como prdida de lquido, sangrado, dolores de cabeza intensos o clicos abdominales.  Si tiene alguna duda. Otros estudios que podrn realizarse durante el segundo trimestre son:   Anlisis de sangre para evaluar:  Niveles bajos de hierro (anemia).  Diabetes gestacional (entre las 24 y las 28 semanas).  Anticuerpos Rh.  Anlisis de orina para detectar infecciones, diabetes o protenas en la orina.  Una ecografa para confirmar si el crecimiento y el desarrollo del beb son los adecuados.  Una amniocentesis para diagnosticar posibles problemas genticos.  Estudios del feto para descartar espina bfida y sndrome de Down. INSTRUCCIONES PARA EL CUIDADO EN EL HOGAR   Evite fumar, consumir hierbas, beber alcohol y utilizar frmacos que no ne hayan recetado. Estas sustancias qumicas afectan la formacin y el desarrollo del beb.  Siga las indicaciones del profesional con respecto a como tomar los medicamentos. Durante el embarazo, hay   medicamentos que son seguros y otros no lo son.  Realice actividad fsica slo segn las indicaciones del mdico. Sentir clicos uterinos es el mejor signo para detener la actividad fsica.  Contine haciendo comidas regulares y sanas.  Use un sostn que le brinde buen  soporte si sus mamas estn sensibles.  No utilice la baera con agua caliente, baos turcos y saunas.  Colquese el cinturn de seguridad cuando conduzca.  Evite comer carne cruda y el contacto con los utensilios y desperdicios de los gatos. Estos elementos contienen grmenes que pueden causar defectos de nacimiento en el beb.  Tome las vitaminas indicadas para la etapa prenatal.  Pruebe un laxante (si el mdico la autoriza) si tiene constipacin. Consuma ms alimentos ricos en fibra, como vegetales y frutas frescos y cereales enteros. Beba gran cantidad de lquido para mantener la orina de tono claro o amarillo plido.  Tome baos de agua tibia para calmar el dolor o las molestias causadas por las hemorroides. Use una crema para las hemorroides si el mdico la autoriza.  Si tiene venas varicosas, use medias de soporte. Eleve los pies durante 15 minutos, 3 o 4 veces por da. Limite el consumo de sal en su dieta.  Evite levantar objetos pesados, use zapatos de tacones bajos y mantenga una buena postura.  Descanse con las piernas elevadas si tiene calambres o dolor de cintura.  Visite a su dentista si no lo ha hecho durante el embarazo. Use un cepillo de dientes blando para higienizarse los dientes y use suavemente el hilo dental.  Puede continuar su vida sexual siempre que el mdico la autorice.  Concurra a todas las visitas prenatales segn las indicaciones de su mdico. SOLICITE ATENCIN MDICA SI:   Tiene mareos.  Siente clicos leves, presin en la pelvis o dolor persistente en el abdomen.  Tiene nuseas o vmitos o diarrea persistentes.  Observa una secrecin vaginal con mal olor.  Siente dolor al orinar. SOLICITE ATENCIN MDICA DE INMEDIATO SI:   Tiene fiebre.  Pierde lquido por la vagina.  Tiene sangrando o pequeas prdidas vaginales.  Siente dolor intenso o clicos en el abdomen.  Sube o baja de peso rpidamente.  Tiene dificultad para respirar y le duele  pecho.  Sbitamente se le hincha el rostro, las manos, los tobillos, los pies o las piernas de manera extrema.  No ha sentido los movimientos del beb durante una hora.  Siente un dolor de cabeza intenso que no se alivia con medicamentos.  Su visin se modifica. Document Released: 12/12/2004 Document Revised: 11/04/2012 [employer] Patient Information 2014 [employer].  

## 2013-02-25 NOTE — MAU Note (Signed)
Pt was seen in the office today and was sent in for SOB. Normally more in the evening time.

## 2013-02-25 NOTE — Progress Notes (Signed)
HR - 80 Pt in office for routine OB visit, states having difficulty emptying bladder, complains of SOB when lying down, states she uses pillows to prop self up.  O2 sat 98%.  Lungs CTA.  To MAU for further evaluation.

## 2013-03-08 ENCOUNTER — Ambulatory Visit (INDEPENDENT_AMBULATORY_CARE_PROVIDER_SITE_OTHER): Payer: BC Managed Care – PPO | Admitting: Obstetrics & Gynecology

## 2013-03-08 VITALS — BP 116/69 | Temp 98.5°F | Wt 172.0 lb

## 2013-03-08 DIAGNOSIS — Z3400 Encounter for supervision of normal first pregnancy, unspecified trimester: Secondary | ICD-10-CM

## 2013-03-08 DIAGNOSIS — Z3402 Encounter for supervision of normal first pregnancy, second trimester: Secondary | ICD-10-CM

## 2013-03-08 LAB — POCT URINALYSIS DIPSTICK
Ketones, UA: NEGATIVE
Leukocytes, UA: NEGATIVE
Protein, UA: NEGATIVE
Spec Grav, UA: 1.025
Urobilinogen, UA: NEGATIVE
pH, UA: 5

## 2013-03-08 NOTE — Progress Notes (Signed)
Pulse- 81 Doing well.

## 2013-03-10 ENCOUNTER — Encounter: Payer: BC Managed Care – PPO | Admitting: Obstetrics & Gynecology

## 2013-03-10 NOTE — Patient Instructions (Signed)
Glucose Tolerance Test This is a test to see how your body processes carbohydrates. This test is often done to check patients for diabetes or the possibility of developing it. PREPARATION FOR TEST You should have nothing to eat or drink 12 hours before the test. You will be given a form of sugar (glucose) and then blood samples will be drawn from your vein to determine the level of sugar in your blood. Alternatively, blood may be drawn from your finger for testing. You should not smoke or exercise during the test. NORMAL FINDINGS  Fasting: 70-115 mg/dL  30 minutes: less than 200 mg/dL  1 hour: less than 200 mg/dL  2 hours: less than 140 mg/dL  3 hours: 70-115 mg/dL  4 hours: 70-115 mg/dL Ranges for normal findings may vary among different laboratories and hospitals. You should always check with your doctor after having lab work or other tests done to discuss the meaning of your test results and whether your values are considered within normal limits. MEANING OF TEST Your caregiver will go over the test results with you and discuss the importance and meaning of your results, as well as treatment options and the need for additional tests. OBTAINING THE TEST RESULTS It is your responsibility to obtain your test results. Ask the lab or department performing the test when and how you will get your results. Document Released: 03/27/2004 Document Revised: 05/27/2011 Document Reviewed: 02/13/2008 [employer] Patient Information 2014 [employer].  

## 2013-03-18 NOTE — L&D Delivery Note (Signed)
Delivery Note At 3:19 AM a viable female was delivered via Vaginal, Spontaneous Delivery (Presentation: Right Occiput Anterior).  APGAR: 9, 9; weight .   Placenta status: Intact, Spontaneous.  Cord: 3 vessels with the following complications: None.  Cord pH: none  Anesthesia: Epidural  Episiotomy: None Lacerations: None Suture Repair: none Est. Blood Loss (mL): 350  Mom to postpartum.  Baby to Couplet care / Skin to Skin.  [name] A Harper 07/07/2013, 3:50 AM

## 2013-03-22 ENCOUNTER — Other Ambulatory Visit: Payer: Self-pay | Admitting: *Deleted

## 2013-03-22 DIAGNOSIS — O365990 Maternal care for other known or suspected poor fetal growth, unspecified trimester, not applicable or unspecified: Secondary | ICD-10-CM

## 2013-03-24 ENCOUNTER — Encounter: Payer: Self-pay | Admitting: Obstetrics

## 2013-03-24 ENCOUNTER — Telehealth: Payer: Self-pay | Admitting: *Deleted

## 2013-03-24 ENCOUNTER — Ambulatory Visit (INDEPENDENT_AMBULATORY_CARE_PROVIDER_SITE_OTHER): Payer: BC Managed Care – PPO

## 2013-03-24 DIAGNOSIS — O365990 Maternal care for other known or suspected poor fetal growth, unspecified trimester, not applicable or unspecified: Secondary | ICD-10-CM

## 2013-03-24 LAB — US OB DETAIL + 14 WK

## 2013-03-24 MED ORDER — ZOFRAN 4 MG PO TABS: 4.0000 mg | ORAL_TABLET | Freq: Three times a day (TID) | ORAL | Status: DC | PRN

## 2013-04-05 ENCOUNTER — Encounter: Payer: BC Managed Care – PPO | Admitting: Obstetrics

## 2013-04-05 ENCOUNTER — Encounter: Payer: BC Managed Care – PPO | Admitting: Obstetrics & Gynecology

## 2013-04-05 ENCOUNTER — Other Ambulatory Visit: Payer: BC Managed Care – PPO

## 2013-04-06 ENCOUNTER — Other Ambulatory Visit: Payer: BC Managed Care – PPO

## 2013-04-06 ENCOUNTER — Encounter: Payer: BC Managed Care – PPO | Admitting: Obstetrics

## 2013-04-15 ENCOUNTER — Ambulatory Visit (INDEPENDENT_AMBULATORY_CARE_PROVIDER_SITE_OTHER): Payer: BC Managed Care – PPO | Admitting: Obstetrics & Gynecology

## 2013-04-15 ENCOUNTER — Other Ambulatory Visit: Payer: BC Managed Care – PPO

## 2013-04-15 VITALS — BP 108/71 | Temp 98.2°F | Wt 173.0 lb

## 2013-04-15 DIAGNOSIS — Z3480 Encounter for supervision of other normal pregnancy, unspecified trimester: Secondary | ICD-10-CM

## 2013-04-15 DIAGNOSIS — O4700 False labor before 37 completed weeks of gestation, unspecified trimester: Secondary | ICD-10-CM

## 2013-04-15 LAB — CBC
HCT: 31.9 % — ABNORMAL LOW (ref 36.0–46.0)
Hemoglobin: 10.7 g/dL — ABNORMAL LOW (ref 12.0–15.0)
MCH: 31.1 pg (ref 26.0–34.0)
MCHC: 33.5 g/dL (ref 30.0–36.0)
MCV: 92.7 fL (ref 78.0–100.0)
PLATELETS: 207 10*3/uL (ref 150–400)
RBC: 3.44 MIL/uL — AB (ref 3.87–5.11)
RDW: 14 % (ref 11.5–15.5)
WBC: 11.6 10*3/uL — AB (ref 4.0–10.5)

## 2013-04-15 LAB — POCT URINALYSIS DIPSTICK
Bilirubin, UA: NEGATIVE
Blood, UA: NEGATIVE
Glucose, UA: NEGATIVE
Ketones, UA: NEGATIVE
LEUKOCYTES UA: NEGATIVE
Nitrite, UA: NEGATIVE
PH UA: 5
Protein, UA: NEGATIVE
Spec Grav, UA: 1.025
UROBILINOGEN UA: NEGATIVE

## 2013-04-15 NOTE — Progress Notes (Signed)
Pulse: 89 Patient states she is having some lower abdominal pain and pain in her lower back. Patient states it feels like the muscles are pulling. Patient states she went to doctor last week and was told she had BV and is currently taking flagyl for it. Patient states she had some clear vaginal discharge last night. Patient states she feels like she is getting a sinus infection.

## 2013-04-16 LAB — RPR

## 2013-04-16 LAB — GLUCOSE TOLERANCE, 2 HOURS W/ 1HR
GLUCOSE, FASTING: 64 mg/dL — AB (ref 70–99)
Glucose, 1 hour: 142 mg/dL (ref 70–170)
Glucose, 2 hour: 97 mg/dL (ref 70–139)

## 2013-04-16 LAB — HIV ANTIBODY (ROUTINE TESTING W REFLEX): HIV: NONREACTIVE

## 2013-04-21 ENCOUNTER — Ambulatory Visit (INDEPENDENT_AMBULATORY_CARE_PROVIDER_SITE_OTHER): Payer: BC Managed Care – PPO | Admitting: Obstetrics & Gynecology

## 2013-04-21 VITALS — BP 112/77 | Temp 98.3°F | Wt 172.0 lb

## 2013-04-21 DIAGNOSIS — Z23 Encounter for immunization: Secondary | ICD-10-CM

## 2013-04-21 DIAGNOSIS — O4700 False labor before 37 completed weeks of gestation, unspecified trimester: Secondary | ICD-10-CM

## 2013-04-21 DIAGNOSIS — Z3400 Encounter for supervision of normal first pregnancy, unspecified trimester: Secondary | ICD-10-CM

## 2013-04-21 LAB — POCT URINALYSIS DIPSTICK
BILIRUBIN UA: NEGATIVE
Blood, UA: NEGATIVE
Glucose, UA: NEGATIVE
Ketones, UA: NEGATIVE
LEUKOCYTES UA: NEGATIVE
Nitrite, UA: NEGATIVE
PROTEIN UA: NEGATIVE
SPEC GRAV UA: 1.01
Urobilinogen, UA: NEGATIVE
pH, UA: 6

## 2013-04-21 MED ORDER — TETANUS-DIPHTH-ACELL PERTUSSIS 5-2.5-18.5 LF-MCG/0.5 IM SUSP: 0.5000 mL | Freq: Once | INTRAMUSCULAR | Status: DC

## 2013-04-21 NOTE — Patient Instructions (Signed)
Tetanus, Diphtheria (Td) Vaccine What You Need to Know WHY GET VACCINATED? Tetanus  and diphtheria are very serious diseases. They are rare in the [place] today, but people who do become infected often have severe complications. Td vaccine is used to protect adolescents and adults from both of these diseases. Both tetanus and diphtheria are infections caused by bacteria. Diphtheria spreads from person to person through coughing or sneezing. Tetanus-causing bacteria enter the body through cuts, scratches, or wounds. TETANUS (Lockjaw) causes painful muscle tightening and stiffness, usually all over the body.  It can lead to tightening of muscles in the head and neck so you can't open your mouth, swallow, or sometimes even breathe. Tetanus kills about 1 out of every 5 people who are infected. DIPHTHERIA can cause a thick coating to form in the back of the throat.  It can lead to breathing problems, paralysis, heart failure, and death. Before vaccines, the [place] saw as many as 200,000 cases a year of diphtheria and hundreds of cases of tetanus. Since vaccination began, cases of both diseases have dropped by about 99%. TD VACCINE Td vaccine can protect adolescents and adults from tetanus and diphtheria. Td is usually given as a booster dose every 10 years but it can also be given earlier after a severe and dirty wound or burn. Your doctor can give you more information. Td may safely be given at the same time as other vaccines. SOME PEOPLE SHOULD NOT GET THIS VACCINE  If you ever had a life-threatening allergic reaction after a dose of any tetanus or diphtheria containing vaccine, OR if you have a severe allergy to any part of this vaccine, you should not get Td. Tell your doctor if you have any severe allergies.  Talk to your doctor if you:  have epilepsy or another nervous system problem,  had severe pain or swelling after any vaccine containing diphtheria or tetanus,  ever had  Guillain Barr Syndrome (GBS),  aren't feeling well on the day the shot is scheduled. RISKS OF A VACCINE REACTION With a vaccine, like any medicine, there is a chance of side effects. These are usually mild and go away on their own. Serious side effects are also possible, but are very rare. Most people who get Td vaccine do not have any problems with it. Mild Problems  following Td (Did not interfere with activities)  Pain where the shot was given (about 8 people in 10)  Redness or swelling where the shot was given (about 1 person in 3)  Mild fever (about 1 person in 15)  Headache or Tiredness (uncommon) Moderate Problems following Td (Interfered with activities, but did not require medical attention)  Fever over 102 F (38.9 C) (rare) Severe Problems  following Td (Unable to perform usual activities; required medical attention)  Swelling, severe pain, bleeding, or redness in the arm where the shot was given (rare). Problems that could happen after any vaccine:  Brief fainting spells can happen after any medical procedure, including vaccination. Sitting or lying down for about 15 minutes can help prevent fainting, and injuries caused by a fall. Tell your doctor if you feel dizzy, or have vision changes or ringing in the ears.  Severe shoulder pain and reduced range of motion in the arm where a shot was given can happen, very rarely, after a vaccination.  Severe allergic reactions from a vaccine are very rare, estimated at less than 1 in a million doses. If one were to occur, it would   usually be within a few minutes to a few hours after the vaccination. WHAT IF THERE IS A SERIOUS REACTION? What should I look for?  Look for anything that concerns you, such as signs of a severe allergic reaction, very high fever, or behavior changes. Signs of a severe allergic reaction can include hives, swelling of the face and throat, difficulty breathing, a fast heartbeat, dizziness, and  weakness. These would usually start a few minutes to a few hours after the vaccination. What should I do?  If you think it is a severe allergic reaction or other emergency that can't wait, call 911 or get the person to the nearest hospital. Otherwise, call your doctor.  Afterward, the reaction should be reported to the Vaccine Adverse Event Reporting System (VAERS). Your doctor might file this report, or, you can do it yourself through the VAERS website or by calling 1-800-822-7967. VAERS is only for reporting reactions. They do not give medical advice. THE NATIONAL VACCINE INJURY COMPENSATION PROGRAM The National Vaccine Injury Compensation Program (VICP) is a federal program that was created to compensate people who may have been injured by certain vaccines. Persons who believe they may have been injured by a vaccine can learn about the program and about filing a claim by calling 1-800-338-2382 or visiting the VICP website. HOW CAN I LEARN MORE?  Ask your doctor.  Contact your local or state health department.  Contact the Centers for Disease Control and Prevention (CDC):  Call 1-800-232-4636 (1-800-CDC-INFO)  Visit CDC's vaccines website CDC Td Vaccine Interim VIS (04/21/12) Document Released: 12/30/2005 Document Revised: 06/29/2012 Document Reviewed: 06/24/2012 [employer] Patient Information 2014 [employer].  

## 2013-04-21 NOTE — Progress Notes (Signed)
Pulse 89 Pt states that she has some lower back pain. Pt was advised by on call nurse to f/u with office due to having mucous discharge. Pt states that this is a new occurrence.  Pt states that this has been for the past day.  Pt states that she is also having some tingling in hands and feet.  Pt states that she does have some swelling but will resolve once she elevates feet.   Pt was given depression screening at today's visit with a score of 8- mild depression.  Will continue to monitor for worsening vegetative signs/symptoms of depression.  U/S for growth. Referral-->nutritionist.  U/S by me: cx > 3 cm.

## 2013-04-22 ENCOUNTER — Encounter: Payer: Self-pay | Admitting: Obstetrics & Gynecology

## 2013-04-22 ENCOUNTER — Encounter: Payer: BC Managed Care – PPO | Admitting: Obstetrics & Gynecology

## 2013-04-26 NOTE — Patient Instructions (Signed)
Third Trimester of Pregnancy  The third trimester is from week 29 through week 42, months 7 through 9. The third trimester is a time when the fetus is growing rapidly. At the end of the ninth month, the fetus is about 20 inches in length and weighs 6 10 pounds.   BODY CHANGES  Your body goes through many changes during pregnancy. The changes vary from woman to woman.    Your weight will continue to increase. You can expect to gain 25 35 pounds (11 16 kg) by the end of the pregnancy.   You may begin to get stretch marks on your hips, abdomen, and breasts.   You may urinate more often because the fetus is moving lower into your pelvis and pressing on your bladder.   You may develop or continue to have heartburn as a result of your pregnancy.   You may develop constipation because certain hormones are causing the muscles that push waste through your intestines to slow down.   You may develop hemorrhoids or swollen, bulging veins (varicose veins).   You may have pelvic pain because of the weight gain and pregnancy hormones relaxing your joints between the bones in your pelvis. Back aches may result from over exertion of the muscles supporting your posture.   Your breasts will continue to grow and be tender. A yellow discharge may leak from your breasts called colostrum.   Your belly button may stick out.   You may feel short of breath because of your expanding uterus.   You may notice the fetus "dropping," or moving lower in your abdomen.   You may have a bloody mucus discharge. This usually occurs a few days to a week before labor begins.   Your cervix becomes thin and soft (effaced) near your due date.  WHAT TO EXPECT AT YOUR PRENATAL EXAMS   You will have prenatal exams every 2 weeks until week 36. Then, you will have weekly prenatal exams. During a routine prenatal visit:   You will be weighed to make sure you and the fetus are growing normally.   Your blood pressure is taken.   Your abdomen will be  measured to track your baby's growth.   The fetal heartbeat will be listened to.   Any test results from the previous visit will be discussed.   You may have a cervical check near your due date to see if you have effaced.  At around 36 weeks, your caregiver will check your cervix. At the same time, your caregiver will also perform a test on the secretions of the vaginal tissue. This test is to determine if a type of bacteria, Group B streptococcus, is present. Your caregiver will explain this further.  Your caregiver may ask you:   What your birth plan is.   How you are feeling.   If you are feeling the baby move.   If you have had any abnormal symptoms, such as leaking fluid, bleeding, severe headaches, or abdominal cramping.   If you have any questions.  Other tests or screenings that may be performed during your third trimester include:   Blood tests that check for low iron levels (anemia).   Fetal testing to check the health, activity level, and growth of the fetus. Testing is done if you have certain medical conditions or if there are problems during the pregnancy.  FALSE LABOR  You may feel small, irregular contractions that eventually go away. These are called Braxton Hicks contractions, or   false labor. Contractions may last for hours, days, or even weeks before true labor sets in. If contractions come at regular intervals, intensify, or become painful, it is best to be seen by your caregiver.   SIGNS OF LABOR    Menstrual-like cramps.   Contractions that are 5 minutes apart or less.   Contractions that start on the top of the uterus and spread down to the lower abdomen and back.   A sense of increased pelvic pressure or back pain.   A watery or bloody mucus discharge that comes from the vagina.  If you have any of these signs before the 37th week of pregnancy, call your caregiver right away. You need to go to the hospital to get checked immediately.  HOME CARE INSTRUCTIONS    Avoid all  smoking, herbs, alcohol, and unprescribed drugs. These chemicals affect the formation and growth of the baby.   Follow your caregiver's instructions regarding medicine use. There are medicines that are either safe or unsafe to take during pregnancy.   Exercise only as directed by your caregiver. Experiencing uterine cramps is a good sign to stop exercising.   Continue to eat regular, healthy meals.   Wear a good support bra for breast tenderness.   Do not use hot tubs, steam rooms, or saunas.   Wear your seat belt at all times when driving.   Avoid raw meat, uncooked cheese, cat litter boxes, and soil used by cats. These carry germs that can cause birth defects in the baby.   Take your prenatal vitamins.   Try taking a stool softener (if your caregiver approves) if you develop constipation. Eat more high-fiber foods, such as fresh vegetables or fruit and whole grains. Drink plenty of fluids to keep your urine clear or pale yellow.   Take warm sitz baths to soothe any pain or discomfort caused by hemorrhoids. Use hemorrhoid cream if your caregiver approves.   If you develop varicose veins, wear support hose. Elevate your feet for 15 minutes, 3 4 times a day. Limit salt in your diet.   Avoid heavy lifting, wear low heal shoes, and practice good posture.   Rest a lot with your legs elevated if you have leg cramps or low back pain.   Visit your dentist if you have not gone during your pregnancy. Use a soft toothbrush to brush your teeth and be gentle when you floss.   A sexual relationship may be continued unless your caregiver directs you otherwise.   Do not travel far distances unless it is absolutely necessary and only with the approval of your caregiver.   Take prenatal classes to understand, practice, and ask questions about the labor and delivery.   Make a trial run to the hospital.   Pack your hospital bag.   Prepare the baby's nursery.   Continue to go to all your prenatal visits as directed  by your caregiver.  SEEK MEDICAL CARE IF:   You are unsure if you are in labor or if your water has broken.   You have dizziness.   You have mild pelvic cramps, pelvic pressure, or nagging pain in your abdominal area.   You have persistent nausea, vomiting, or diarrhea.   You have a bad smelling vaginal discharge.   You have pain with urination.  SEEK IMMEDIATE MEDICAL CARE IF:    You have a fever.   You are leaking fluid from your vagina.   You have spotting or bleeding from your vagina.     You have severe abdominal cramping or pain.   You have rapid weight loss or gain.   You have shortness of breath with chest pain.   You notice sudden or extreme swelling of your face, hands, ankles, feet, or legs.   You have not felt your baby move in over an hour.   You have severe headaches that do not go away with medicine.   You have vision changes.  Document Released: 02/26/2001 Document Revised: 11/04/2012 Document Reviewed: 05/05/2012  [employer] Patient Information 2014 [employer].

## 2013-04-27 ENCOUNTER — Ambulatory Visit (INDEPENDENT_AMBULATORY_CARE_PROVIDER_SITE_OTHER): Payer: BC Managed Care – PPO

## 2013-04-27 ENCOUNTER — Other Ambulatory Visit: Payer: Self-pay | Admitting: *Deleted

## 2013-04-27 DIAGNOSIS — O365990 Maternal care for other known or suspected poor fetal growth, unspecified trimester, not applicable or unspecified: Secondary | ICD-10-CM

## 2013-04-28 ENCOUNTER — Encounter: Payer: Self-pay | Admitting: Obstetrics & Gynecology

## 2013-04-28 LAB — US OB DETAIL + 14 WK

## 2013-04-29 ENCOUNTER — Inpatient Hospital Stay (HOSPITAL_COMMUNITY): Admission: AD | Admit: 2013-04-29 | Payer: BC Managed Care – PPO | Source: Ambulatory Visit | Admitting: Obstetrics

## 2013-05-05 ENCOUNTER — Encounter: Payer: Self-pay | Admitting: Obstetrics & Gynecology

## 2013-05-05 ENCOUNTER — Ambulatory Visit (INDEPENDENT_AMBULATORY_CARE_PROVIDER_SITE_OTHER): Payer: BC Managed Care – PPO | Admitting: Obstetrics & Gynecology

## 2013-05-05 VITALS — BP 102/69 | Temp 98.9°F | Wt 176.0 lb

## 2013-05-05 DIAGNOSIS — Z3400 Encounter for supervision of normal first pregnancy, unspecified trimester: Secondary | ICD-10-CM

## 2013-05-05 LAB — POCT URINALYSIS DIPSTICK
Bilirubin, UA: NEGATIVE
Glucose, UA: NEGATIVE
Ketones, UA: NEGATIVE
Leukocytes, UA: NEGATIVE
Nitrite, UA: NEGATIVE
PROTEIN UA: NEGATIVE
RBC UA: NEGATIVE
SPEC GRAV UA: 1.01
Urobilinogen, UA: NEGATIVE
pH, UA: 7

## 2013-05-05 NOTE — Patient Instructions (Signed)
Third Trimester of Pregnancy  The third trimester is from week 29 through week 42, months 7 through 9. The third trimester is a time when the fetus is growing rapidly. At the end of the ninth month, the fetus is about 20 inches in length and weighs 6 10 pounds.   BODY CHANGES  Your body goes through many changes during pregnancy. The changes vary from woman to woman.    Your weight will continue to increase. You can expect to gain 25 35 pounds (11 16 kg) by the end of the pregnancy.   You may begin to get stretch marks on your hips, abdomen, and breasts.   You may urinate more often because the fetus is moving lower into your pelvis and pressing on your bladder.   You may develop or continue to have heartburn as a result of your pregnancy.   You may develop constipation because certain hormones are causing the muscles that push waste through your intestines to slow down.   You may develop hemorrhoids or swollen, bulging veins (varicose veins).   You may have pelvic pain because of the weight gain and pregnancy hormones relaxing your joints between the bones in your pelvis. Back aches may result from over exertion of the muscles supporting your posture.   Your breasts will continue to grow and be tender. A yellow discharge may leak from your breasts called colostrum.   Your belly button may stick out.   You may feel short of breath because of your expanding uterus.   You may notice the fetus "dropping," or moving lower in your abdomen.   You may have a bloody mucus discharge. This usually occurs a few days to a week before labor begins.   Your cervix becomes thin and soft (effaced) near your due date.  WHAT TO EXPECT AT YOUR PRENATAL EXAMS   You will have prenatal exams every 2 weeks until week 36. Then, you will have weekly prenatal exams. During a routine prenatal visit:   You will be weighed to make sure you and the fetus are growing normally.   Your blood pressure is taken.   Your abdomen will be  measured to track your baby's growth.   The fetal heartbeat will be listened to.   Any test results from the previous visit will be discussed.   You may have a cervical check near your due date to see if you have effaced.  At around 36 weeks, your caregiver will check your cervix. At the same time, your caregiver will also perform a test on the secretions of the vaginal tissue. This test is to determine if a type of bacteria, Group B streptococcus, is present. Your caregiver will explain this further.  Your caregiver may ask you:   What your birth plan is.   How you are feeling.   If you are feeling the baby move.   If you have had any abnormal symptoms, such as leaking fluid, bleeding, severe headaches, or abdominal cramping.   If you have any questions.  Other tests or screenings that may be performed during your third trimester include:   Blood tests that check for low iron levels (anemia).   Fetal testing to check the health, activity level, and growth of the fetus. Testing is done if you have certain medical conditions or if there are problems during the pregnancy.  FALSE LABOR  You may feel small, irregular contractions that eventually go away. These are called Braxton Hicks contractions, or   false labor. Contractions may last for hours, days, or even weeks before true labor sets in. If contractions come at regular intervals, intensify, or become painful, it is best to be seen by your caregiver.   SIGNS OF LABOR    Menstrual-like cramps.   Contractions that are 5 minutes apart or less.   Contractions that start on the top of the uterus and spread down to the lower abdomen and back.   A sense of increased pelvic pressure or back pain.   A watery or bloody mucus discharge that comes from the vagina.  If you have any of these signs before the 37th week of pregnancy, call your caregiver right away. You need to go to the hospital to get checked immediately.  HOME CARE INSTRUCTIONS    Avoid all  smoking, herbs, alcohol, and unprescribed drugs. These chemicals affect the formation and growth of the baby.   Follow your caregiver's instructions regarding medicine use. There are medicines that are either safe or unsafe to take during pregnancy.   Exercise only as directed by your caregiver. Experiencing uterine cramps is a good sign to stop exercising.   Continue to eat regular, healthy meals.   Wear a good support bra for breast tenderness.   Do not use hot tubs, steam rooms, or saunas.   Wear your seat belt at all times when driving.   Avoid raw meat, uncooked cheese, cat litter boxes, and soil used by cats. These carry germs that can cause birth defects in the baby.   Take your prenatal vitamins.   Try taking a stool softener (if your caregiver approves) if you develop constipation. Eat more high-fiber foods, such as fresh vegetables or fruit and whole grains. Drink plenty of fluids to keep your urine clear or pale yellow.   Take warm sitz baths to soothe any pain or discomfort caused by hemorrhoids. Use hemorrhoid cream if your caregiver approves.   If you develop varicose veins, wear support hose. Elevate your feet for 15 minutes, 3 4 times a day. Limit salt in your diet.   Avoid heavy lifting, wear low heal shoes, and practice good posture.   Rest a lot with your legs elevated if you have leg cramps or low back pain.   Visit your dentist if you have not gone during your pregnancy. Use a soft toothbrush to brush your teeth and be gentle when you floss.   A sexual relationship may be continued unless your caregiver directs you otherwise.   Do not travel far distances unless it is absolutely necessary and only with the approval of your caregiver.   Take prenatal classes to understand, practice, and ask questions about the labor and delivery.   Make a trial run to the hospital.   Pack your hospital bag.   Prepare the baby's nursery.   Continue to go to all your prenatal visits as directed  by your caregiver.  SEEK MEDICAL CARE IF:   You are unsure if you are in labor or if your water has broken.   You have dizziness.   You have mild pelvic cramps, pelvic pressure, or nagging pain in your abdominal area.   You have persistent nausea, vomiting, or diarrhea.   You have a bad smelling vaginal discharge.   You have pain with urination.  SEEK IMMEDIATE MEDICAL CARE IF:    You have a fever.   You are leaking fluid from your vagina.   You have spotting or bleeding from your vagina.     You have severe abdominal cramping or pain.   You have rapid weight loss or gain.   You have shortness of breath with chest pain.   You notice sudden or extreme swelling of your face, hands, ankles, feet, or legs.   You have not felt your baby move in over an hour.   You have severe headaches that do not go away with medicine.   You have vision changes.  Document Released: 02/26/2001 Document Revised: 11/04/2012 Document Reviewed: 05/05/2012  [employer] Patient Information 2014 [employer].

## 2013-05-05 NOTE — Progress Notes (Signed)
Pulse: 89 Patient states she is still having the round ligament pain but that it is starting to get worse. Patient states she is having some contractions but that they are irregular. Patient states she is still having some discharge but that it is getting lighter. Patient states she constantly feels like she is getting a cold. Patient states she has stuffy nose, cough, and pressure in her head. Patient states she has not ran a fever and snot is clear.

## 2013-05-10 ENCOUNTER — Ambulatory Visit (INDEPENDENT_AMBULATORY_CARE_PROVIDER_SITE_OTHER): Payer: BC Managed Care – PPO | Admitting: Obstetrics & Gynecology

## 2013-05-10 VITALS — BP 121/72 | Temp 98.5°F | Wt 176.0 lb

## 2013-05-10 DIAGNOSIS — Z3400 Encounter for supervision of normal first pregnancy, unspecified trimester: Secondary | ICD-10-CM

## 2013-05-10 NOTE — Progress Notes (Signed)
Pulse 85 Pt states that she is feeling dizzy today.  Pt states that she is having mucous d/c. Pt reassured.

## 2013-05-19 ENCOUNTER — Ambulatory Visit: Payer: BC Managed Care – PPO | Admitting: *Deleted

## 2013-05-19 ENCOUNTER — Ambulatory Visit (INDEPENDENT_AMBULATORY_CARE_PROVIDER_SITE_OTHER): Payer: BC Managed Care – PPO | Admitting: Obstetrics & Gynecology

## 2013-05-19 VITALS — BP 107/72 | Temp 98.5°F | Wt 178.0 lb

## 2013-05-19 DIAGNOSIS — Z3400 Encounter for supervision of normal first pregnancy, unspecified trimester: Secondary | ICD-10-CM

## 2013-05-19 NOTE — Progress Notes (Signed)
Pulse: 93 Patient states she is still having the ligament pain in her groin. Patient states she would like to talk about the belly band. Patient states she noticed that if she holds her stomach up when she walks the pain is not as bad. Patient states she was not feeling well last night that she was having diarrhea that was just straight yellow water and pressure and a sharp  pain in her lower abdomen so she went to [facility] last night.Patient states she was having pre regular contractions that were 7-8 minutes apart. Patient states she was told she was dehydrated and was given 2 bags of fluid. Patient states she was given Terbutaline to stop the contractions. Patient states she was notified that she was a finger tip dilated. Patient states she has had 2 contractions this morning but that they were irregular. Patient states she was told that the medication could cause her heart to race. Patient states she can feel her heart racing sometimes but that she takes a deep breath and closes her eyes and it goes away. Patient states she was notified that she has the pups rash on her stomach.

## 2013-05-22 NOTE — Patient Instructions (Signed)
Preterm Labor Information Preterm labor is when labor starts at less than 37 weeks of pregnancy. The normal length of a pregnancy is 39 to 41 weeks. CAUSES Often, there is no identifiable underlying cause as to why a woman goes into preterm labor. One of the most common known causes of preterm labor is infection. Infections of the uterus, cervix, vagina, amniotic sac, bladder, kidney, or even the lungs (pneumonia) can cause labor to start. Other suspected causes of preterm labor include:   Urogenital infections, such as yeast infections and bacterial vaginosis.   Uterine abnormalities (uterine shape, uterine septum, fibroids, or bleeding from the placenta).   A cervix that has been operated on (it may fail to stay closed).   Malformations in the fetus.   Multiple gestations (twins, triplets, and so on).   Breakage of the amniotic sac.  RISK FACTORS  Having a previous history of preterm labor.   Having premature rupture of membranes (PROM).   Having a placenta that covers the opening of the cervix (placenta previa).   Having a placenta that separates from the uterus (placental abruption).   Having a cervix that is too weak to hold the fetus in the uterus (incompetent cervix).   Having too much fluid in the amniotic sac (polyhydramnios).   Taking illegal drugs or smoking while pregnant.   Not gaining enough weight while pregnant.   Being younger than 18 and older than 25 years old.   Having a low socioeconomic status.   Being African American. SYMPTOMS Signs and symptoms of preterm labor include:   Menstrual-like cramps, abdominal pain, or back pain.  Uterine contractions that are regular, as frequent as six in an hour, regardless of their intensity (may be mild or painful).  Contractions that start on the top of the uterus and spread down to the lower abdomen and back.   A sense of increased pelvic pressure.   A watery or bloody mucus discharge that  comes from the vagina.  TREATMENT Depending on the length of the pregnancy and other circumstances, your health care provider may suggest bed rest. If necessary, there are medicines that can be given to stop contractions and to mature the fetal lungs. If labor happens before 34 weeks of pregnancy, a prolonged hospital stay may be recommended. Treatment depends on the condition of both you and the fetus.  WHAT SHOULD YOU DO IF YOU THINK YOU ARE IN PRETERM LABOR? Call your health care provider right away. You will need to go to the hospital to get checked immediately. HOW CAN YOU PREVENT PRETERM LABOR IN FUTURE PREGNANCIES? You should:   Stop smoking if you smoke.  Maintain healthy weight gain and avoid chemicals and drugs that are not necessary.  Be watchful for any type of infection.  Inform your health care provider if you have a known history of preterm labor. Document Released: 05/25/2003 Document Revised: 11/04/2012 Document Reviewed: 04/06/2012 [employer] Patient Information 2014 [employer].    

## 2013-05-27 ENCOUNTER — Encounter: Payer: Self-pay | Admitting: Obstetrics

## 2013-05-27 ENCOUNTER — Ambulatory Visit (INDEPENDENT_AMBULATORY_CARE_PROVIDER_SITE_OTHER): Payer: BC Managed Care – PPO | Admitting: Obstetrics

## 2013-05-27 VITALS — BP 122/77 | Temp 98.1°F | Wt 176.0 lb

## 2013-05-27 DIAGNOSIS — Z3400 Encounter for supervision of normal first pregnancy, unspecified trimester: Secondary | ICD-10-CM

## 2013-05-27 LAB — POCT URINALYSIS DIPSTICK
Bilirubin, UA: NEGATIVE
GLUCOSE UA: NEGATIVE
KETONES UA: NEGATIVE
Nitrite, UA: NEGATIVE
PH UA: 6
Protein, UA: NEGATIVE
RBC UA: NEGATIVE
Spec Grav, UA: 1.015
Urobilinogen, UA: NEGATIVE

## 2013-05-27 NOTE — Progress Notes (Signed)
Pulse 105 Pt states that the past few days she has had more contractions.  Pt states that she also has a constant back ache.

## 2013-06-10 ENCOUNTER — Ambulatory Visit (INDEPENDENT_AMBULATORY_CARE_PROVIDER_SITE_OTHER): Payer: BC Managed Care – PPO | Admitting: Obstetrics

## 2013-06-10 ENCOUNTER — Encounter: Payer: Self-pay | Admitting: Obstetrics

## 2013-06-10 VITALS — BP 118/80 | Temp 98.0°F | Wt 177.0 lb

## 2013-06-10 DIAGNOSIS — Z3400 Encounter for supervision of normal first pregnancy, unspecified trimester: Secondary | ICD-10-CM

## 2013-06-10 LAB — POCT URINALYSIS DIPSTICK
BILIRUBIN UA: NEGATIVE
Glucose, UA: NEGATIVE
Ketones, UA: NEGATIVE
NITRITE UA: NEGATIVE
Protein, UA: NEGATIVE
RBC UA: NEGATIVE
SPEC GRAV UA: 1.015
Urobilinogen, UA: NEGATIVE
pH, UA: 6

## 2013-06-10 NOTE — Progress Notes (Signed)
Doing well 

## 2013-06-10 NOTE — Progress Notes (Signed)
Pulse- 86 Pt states she is haivng lower back pain. Pt states she has noticed some tingling in her hands and feet.

## 2013-06-12 LAB — STREP B DNA PROBE: GBSP: NEGATIVE

## 2013-06-23 ENCOUNTER — Ambulatory Visit (INDEPENDENT_AMBULATORY_CARE_PROVIDER_SITE_OTHER): Payer: BC Managed Care – PPO | Admitting: Obstetrics

## 2013-06-23 VITALS — BP 114/77 | Temp 98.4°F | Wt 181.0 lb

## 2013-06-23 DIAGNOSIS — Z3400 Encounter for supervision of normal first pregnancy, unspecified trimester: Secondary | ICD-10-CM

## 2013-06-23 DIAGNOSIS — M549 Dorsalgia, unspecified: Secondary | ICD-10-CM

## 2013-06-23 LAB — POCT URINALYSIS DIPSTICK
Bilirubin, UA: NEGATIVE
Blood, UA: NEGATIVE
Glucose, UA: NEGATIVE
KETONES UA: NEGATIVE
NITRITE UA: NEGATIVE
PH UA: 6
PROTEIN UA: NEGATIVE
Spec Grav, UA: 1.015
Urobilinogen, UA: NEGATIVE

## 2013-06-23 MED ORDER — TRAMADOL HCL 50 MG PO TABS: 100.0000 mg | ORAL_TABLET | Freq: Four times a day (QID) | ORAL | Status: DC | PRN

## 2013-06-23 NOTE — Progress Notes (Signed)
Pulse: 92  Patient states it feels like throbbing in her vagina. Patient states it doesn't hurt and its not constant but just a throbbing. Patient states she is having swelling in feet, ankles, hands and a little in her face and that its no longer going away. Patient states she does have a sore throat and a ear ache in her right ear. Patient states discharge is the same.

## 2013-06-29 ENCOUNTER — Encounter (HOSPITAL_COMMUNITY): Payer: Self-pay | Admitting: *Deleted

## 2013-06-29 ENCOUNTER — Inpatient Hospital Stay (HOSPITAL_COMMUNITY)
Admission: AD | Admit: 2013-06-29 | Discharge: 2013-06-29 | Disposition: A | Discharge status: Home / Self Care | Payer: BC Managed Care – PPO | Source: Ambulatory Visit | Attending: Obstetrics | Admitting: Obstetrics

## 2013-06-29 DIAGNOSIS — O99891 Other specified diseases and conditions complicating pregnancy: Secondary | ICD-10-CM | POA: Insufficient documentation

## 2013-06-29 DIAGNOSIS — K259 Gastric ulcer, unspecified as acute or chronic, without hemorrhage or perforation: Secondary | ICD-10-CM | POA: Insufficient documentation

## 2013-06-29 DIAGNOSIS — R51 Headache: Secondary | ICD-10-CM

## 2013-06-29 DIAGNOSIS — Z3400 Encounter for supervision of normal first pregnancy, unspecified trimester: Secondary | ICD-10-CM

## 2013-06-29 DIAGNOSIS — Z87891 Personal history of nicotine dependence: Secondary | ICD-10-CM | POA: Insufficient documentation

## 2013-06-29 DIAGNOSIS — O9989 Other specified diseases and conditions complicating pregnancy, childbirth and the puerperium: Principal | ICD-10-CM

## 2013-06-29 DIAGNOSIS — O368190 Decreased fetal movements, unspecified trimester, not applicable or unspecified: Secondary | ICD-10-CM

## 2013-06-29 DIAGNOSIS — O26899 Other specified pregnancy related conditions, unspecified trimester: Secondary | ICD-10-CM

## 2013-06-29 DIAGNOSIS — G43909 Migraine, unspecified, not intractable, without status migrainosus: Secondary | ICD-10-CM | POA: Insufficient documentation

## 2013-06-29 LAB — URINALYSIS, ROUTINE W REFLEX MICROSCOPIC
BILIRUBIN URINE: NEGATIVE
Glucose, UA: NEGATIVE mg/dL
Hgb urine dipstick: NEGATIVE
KETONES UR: NEGATIVE mg/dL
Leukocytes, UA: NEGATIVE
NITRITE: NEGATIVE
PROTEIN: NEGATIVE mg/dL
Specific Gravity, Urine: 1.025 (ref 1.005–1.030)
UROBILINOGEN UA: 0.2 mg/dL (ref 0.0–1.0)
pH: 6.5 (ref 5.0–8.0)

## 2013-06-29 MED ORDER — BUTALBITAL-APAP-CAFFEINE 50-325-40 MG PO TABS
1.0000 | ORAL_TABLET | Freq: Once | ORAL | Status: AC
  Administered 2013-06-29: 1 via ORAL
  Filled 2013-06-29: qty 1

## 2013-06-29 MED ORDER — BUTALBITAL-APAP-CAFFEINE 50-325-40 MG PO TABS
1.0000 | ORAL_TABLET | Freq: Four times a day (QID) | ORAL | Status: DC | PRN
Start: 2013-06-29 — End: 2013-07-09

## 2013-06-29 NOTE — Discharge Instructions (Signed)

## 2013-06-29 NOTE — MAU Provider Note (Signed)
History     CSN: [id number]  Arrival date and time: 06/29/13 2046   First Provider Initiated Contact with Patient 06/29/13 2120      Chief Complaint  Patient presents with  . Headache  . Decreased Fetal Movement   HPI Ms. Valerie [name] is a 23 y.o. G3P0020 at 2044w4d who presents to MAU today with complaint of HA x 2 days. She states pain is mild to moderate. She took Tylenol yesterday without relief. She also noted a few episodes of floaters today without blurred vision. She denies RUQ pain or peripheral edema. She states mild lower abdominal pressure occasionally. She is having very mild occasional contractions, but none today. She reports good fetal movement in MAU today. She states a history of HTN requiring medications as a teenager. She has not required medications in a few years and has not had any episodes of HTN during this pregnancy.   OB History   Grav Para Term Preterm Abortions TAB SAB Ect Mult Living   3    2  2    0      Past Medical History  Diagnosis Date  . Migraine   . Gastric ulcer   . UTI (urinary tract infection)   . Infection     UTI, chronic sinus inf  . Eczema   . Ovarian cyst   . Abnormal Pap smear     Past Surgical History  Procedure Laterality Date  . Colposcopy    . Myringotomy with tube placement    . Adenoidectomy      Family History  Problem Relation Age of Onset  . Hypertension Mother   . Diabetes Father   . Cancer Paternal Aunt     female  . Diabetes Paternal Uncle   . Diabetes Paternal Grandmother     History  Substance Use Topics  . Smoking status: Former Smoker -- 0.50 packs/day    Types: Cigarettes    Quit date: 11/19/2012  . Smokeless tobacco: Never Used     Comment: Aug 2014  . Alcohol Use: No    Allergies:  Allergies  Allergen Reactions  . Augmentin [Amoxicillin-Pot Clavulanate] Diarrhea and Nausea And Vomiting       . Macrobid [Nitrofurantoin Macrocrystal] Hives  . Raspberry Swelling    Throat swelling  .  Vicodin [Hydrocodone-Acetaminophen] Hives    No prescriptions prior to admission    Review of Systems  Constitutional: Negative for fever and malaise/fatigue.  Eyes: Negative for blurred vision.  Gastrointestinal: Negative for abdominal pain.  Genitourinary:       Neg - vaginal bleeding, abnormal discharge, LOF  Neurological: Positive for headaches.   Physical Exam   Blood pressure 113/98, pulse 81, temperature 97.5 F (36.4 C), temperature source Oral, resp. rate 16, height 5\' 4"  (1.626 m), weight 83.462 kg (184 lb), SpO2 98.00%.  Physical Exam  Constitutional: She is oriented to person, place, and time. She appears well-developed and well-nourished. No distress.  HENT:  Head: Normocephalic and atraumatic.  Cardiovascular: Normal rate, regular rhythm and normal heart sounds.   Respiratory: Effort normal and breath sounds normal. No respiratory distress.  GI: Soft. She exhibits no distension and no mass. There is no tenderness. There is no rebound and no guarding.  Musculoskeletal: She exhibits no edema.  Neurological: She is alert and oriented to person, place, and time. She has normal reflexes.  No clonus  Skin: Skin is warm and dry. No erythema.  Psychiatric: She has a normal mood and affect.  Results for orders placed during the hospital encounter of 06/29/13 (from the past 24 hour(s))  URINALYSIS, ROUTINE W REFLEX MICROSCOPIC     Status: None   Collection Time    06/29/13  8:55 PM      Result Value Ref Range   Color, Urine YELLOW  YELLOW   APPearance CLEAR  CLEAR   Specific Gravity, Urine 1.025  1.005 - 1.030   pH 6.5  5.0 - 8.0   Glucose, UA NEGATIVE  NEGATIVE mg/dL   Hgb urine dipstick NEGATIVE  NEGATIVE   Bilirubin Urine NEGATIVE  NEGATIVE   Ketones, ur NEGATIVE  NEGATIVE mg/dL   Protein, ur NEGATIVE  NEGATIVE mg/dL   Urobilinogen, UA 0.2  0.0 - 1.0 mg/dL   Nitrite NEGATIVE  NEGATIVE   Leukocytes, UA NEGATIVE  NEGATIVE   Patient Vitals for the past 24 hrs:   BP Temp Temp src Pulse Resp SpO2 Height Weight  06/29/13 2302 113/98 mmHg 97.5 F (36.4 C) Oral 81 16 - - -  06/29/13 2116 120/70 mmHg - - 85 - - - -  06/29/13 2100 121/79 mmHg 98.4 F (36.9 C) Oral 81 - 98 % 5\' 4"  (1.626 m) 83.462 kg (184 lb)  06/29/13 2055 118/67 mmHg - - 84 - - - -  06/29/13 2045 114/73 mmHg - - 76 - - - -  06/29/13 2030 115/73 mmHg - - 79 - - - -  06/29/13 2012 124/79 mmHg - - 83 - - - -    Fetal Monitoring: Baseline: 125 bpm, moderate variability, + accelerations, no decelerations Contractions: mild UI   MAU Course  Procedures None  MDM Fioricet today Consulted with pharmD because of Vicodin allergy. Patient has no reaction to Tylenol. Fioricet is unlikely to cause a reaction. Will monitor in MAU after administration.  Normal exam, no proteinuria or elevated BPs.  Patient reports resolution of headache  Assessment and Plan  A: SIUP at 8473w4d Headache   P: Discharge home Rx for Fioricet given to patient Warning signs for pre-e discussed Patient to follow-up as scheduled with Dr. Clearance CootsHarper this week for routine prenatal care Patient may return to MAU as needed or if her condition were to change or worsen  Freddi StarrJulie N Ethier, PA-C  06/29/2013, 11:11 PM

## 2013-06-29 NOTE — MAU Note (Signed)
Pt reports "spots in vision" and a headache for the last 2 days

## 2013-06-30 ENCOUNTER — Encounter: Payer: Self-pay | Admitting: Obstetrics

## 2013-06-30 ENCOUNTER — Ambulatory Visit (INDEPENDENT_AMBULATORY_CARE_PROVIDER_SITE_OTHER): Payer: BC Managed Care – PPO | Admitting: Obstetrics

## 2013-06-30 VITALS — BP 122/76 | Temp 97.2°F | Wt 181.0 lb

## 2013-06-30 DIAGNOSIS — Z3400 Encounter for supervision of normal first pregnancy, unspecified trimester: Secondary | ICD-10-CM

## 2013-06-30 LAB — POCT URINALYSIS DIPSTICK
Bilirubin, UA: NEGATIVE
Glucose, UA: NEGATIVE
KETONES UA: NEGATIVE
Nitrite, UA: NEGATIVE
PH UA: 7
PROTEIN UA: NEGATIVE
RBC UA: NEGATIVE
Spec Grav, UA: 1.01
Urobilinogen, UA: NEGATIVE

## 2013-06-30 NOTE — Progress Notes (Signed)
Pulse- 88 Pt states she is having lower abdomen pain.

## 2013-07-05 ENCOUNTER — Encounter: Payer: Self-pay | Admitting: Obstetrics

## 2013-07-05 DIAGNOSIS — M549 Dorsalgia, unspecified: Secondary | ICD-10-CM | POA: Insufficient documentation

## 2013-07-06 ENCOUNTER — Inpatient Hospital Stay (HOSPITAL_COMMUNITY): Payer: BC Managed Care – PPO | Admitting: Anesthesiology

## 2013-07-06 ENCOUNTER — Encounter (HOSPITAL_COMMUNITY): Payer: BC Managed Care – PPO | Admitting: Anesthesiology

## 2013-07-06 ENCOUNTER — Encounter (HOSPITAL_COMMUNITY): Payer: Self-pay | Admitting: *Deleted

## 2013-07-06 ENCOUNTER — Inpatient Hospital Stay (HOSPITAL_COMMUNITY)
Admission: AD | Admit: 2013-07-06 | Discharge: 2013-07-09 | DRG: 775 | Disposition: A | Discharge status: Home / Self Care | Payer: BC Managed Care – PPO | Source: Ambulatory Visit | Attending: Obstetrics | Admitting: Obstetrics

## 2013-07-06 DIAGNOSIS — R110 Nausea: Secondary | ICD-10-CM

## 2013-07-06 DIAGNOSIS — Z87891 Personal history of nicotine dependence: Secondary | ICD-10-CM

## 2013-07-06 DIAGNOSIS — Z3400 Encounter for supervision of normal first pregnancy, unspecified trimester: Secondary | ICD-10-CM

## 2013-07-06 DIAGNOSIS — Z833 Family history of diabetes mellitus: Secondary | ICD-10-CM

## 2013-07-06 DIAGNOSIS — IMO0001 Reserved for inherently not codable concepts without codable children: Secondary | ICD-10-CM

## 2013-07-06 DIAGNOSIS — Z8249 Family history of ischemic heart disease and other diseases of the circulatory system: Secondary | ICD-10-CM

## 2013-07-06 DIAGNOSIS — K5900 Constipation, unspecified: Secondary | ICD-10-CM

## 2013-07-06 LAB — CBC
HEMATOCRIT: 34.2 % — AB (ref 36.0–46.0)
HEMOGLOBIN: 11.7 g/dL — AB (ref 12.0–15.0)
MCH: 31.9 pg (ref 26.0–34.0)
MCHC: 34.2 g/dL (ref 30.0–36.0)
MCV: 93.2 fL (ref 78.0–100.0)
Platelets: 179 10*3/uL (ref 150–400)
RBC: 3.67 MIL/uL — ABNORMAL LOW (ref 3.87–5.11)
RDW: 13.7 % (ref 11.5–15.5)
WBC: 18 10*3/uL — ABNORMAL HIGH (ref 4.0–10.5)

## 2013-07-06 LAB — RPR

## 2013-07-06 MED ORDER — LACTATED RINGERS IV SOLN
500.0000 mL | Freq: Once | INTRAVENOUS | Status: AC
  Administered 2013-07-06: 500 mL via INTRAVENOUS

## 2013-07-06 MED ORDER — LIDOCAINE HCL (PF) 1 % IJ SOLN
INTRAMUSCULAR | Status: DC | PRN
  Administered 2013-07-06 (×2): 8 mL

## 2013-07-06 MED ORDER — OXYTOCIN 40 UNITS IN LACTATED RINGERS INFUSION - SIMPLE MED
62.5000 mL/h | INTRAVENOUS | Status: DC
  Filled 2013-07-06: qty 1000

## 2013-07-06 MED ORDER — CITRIC ACID-SODIUM CITRATE 334-500 MG/5ML PO SOLN: 30.0000 mL | ORAL | Status: DC | PRN

## 2013-07-06 MED ORDER — PHENYLEPHRINE 40 MCG/ML (10ML) SYRINGE FOR IV PUSH (FOR BLOOD PRESSURE SUPPORT)
80.0000 ug | PREFILLED_SYRINGE | INTRAVENOUS | Status: DC | PRN
Start: 2013-07-06 — End: 2013-07-07
  Filled 2013-07-06: qty 10
  Filled 2013-07-06: qty 2

## 2013-07-06 MED ORDER — ONDANSETRON HCL 4 MG/2ML IJ SOLN
4.0000 mg | Freq: Four times a day (QID) | INTRAMUSCULAR | Status: DC | PRN
  Administered 2013-07-06: 4 mg via INTRAVENOUS
  Filled 2013-07-06: qty 2

## 2013-07-06 MED ORDER — IBUPROFEN 600 MG PO TABS
600.0000 mg | ORAL_TABLET | Freq: Four times a day (QID) | ORAL | Status: DC | PRN
  Administered 2013-07-07: 600 mg via ORAL
  Filled 2013-07-06: qty 1

## 2013-07-06 MED ORDER — FENTANYL 2.5 MCG/ML BUPIVACAINE 1/10 % EPIDURAL INFUSION (WH - ANES)
14.0000 mL/h | INTRAMUSCULAR | Status: DC | PRN
  Administered 2013-07-07: 14 mL/h via EPIDURAL
  Filled 2013-07-06 (×2): qty 125

## 2013-07-06 MED ORDER — LACTATED RINGERS IV SOLN
500.0000 mL | INTRAVENOUS | Status: DC | PRN
  Administered 2013-07-06: 500 mL via INTRAVENOUS

## 2013-07-06 MED ORDER — EPHEDRINE 5 MG/ML INJ
10.0000 mg | INTRAVENOUS | Status: DC | PRN
  Filled 2013-07-06: qty 2

## 2013-07-06 MED ORDER — ACETAMINOPHEN 325 MG PO TABS: 650.0000 mg | ORAL_TABLET | ORAL | Status: DC | PRN

## 2013-07-06 MED ORDER — OXYTOCIN BOLUS FROM INFUSION
500.0000 mL | INTRAVENOUS | Status: DC
  Administered 2013-07-07: 500 mL via INTRAVENOUS

## 2013-07-06 MED ORDER — PHENYLEPHRINE 40 MCG/ML (10ML) SYRINGE FOR IV PUSH (FOR BLOOD PRESSURE SUPPORT)
80.0000 ug | PREFILLED_SYRINGE | INTRAVENOUS | Status: DC | PRN
  Filled 2013-07-06: qty 2

## 2013-07-06 MED ORDER — LIDOCAINE HCL (PF) 1 % IJ SOLN
30.0000 mL | INTRAMUSCULAR | Status: DC | PRN
  Filled 2013-07-06: qty 30

## 2013-07-06 MED ORDER — DIPHENHYDRAMINE HCL 50 MG/ML IJ SOLN
12.5000 mg | INTRAMUSCULAR | Status: DC | PRN
  Administered 2013-07-06 (×2): 12.5 mg via INTRAVENOUS
  Filled 2013-07-06 (×2): qty 1

## 2013-07-06 MED ORDER — FENTANYL 2.5 MCG/ML BUPIVACAINE 1/10 % EPIDURAL INFUSION (WH - ANES)
INTRAMUSCULAR | Status: DC | PRN
  Administered 2013-07-06: 14 mL/h via EPIDURAL

## 2013-07-06 MED ORDER — EPHEDRINE 5 MG/ML INJ
10.0000 mg | INTRAVENOUS | Status: DC | PRN
  Filled 2013-07-06: qty 4
  Filled 2013-07-06: qty 2

## 2013-07-06 MED ORDER — LACTATED RINGERS IV SOLN
INTRAVENOUS | Status: DC
  Administered 2013-07-07: via INTRAVENOUS

## 2013-07-06 NOTE — Anesthesia Procedure Notes (Signed)
Epidural Patient location during procedure: OB Start time: 07/06/2013 5:33 PM End time: 07/06/2013 5:37 PM  Staffing Anesthesiologist: Leilani AbleHATCHETT, Gaege Sangalang Performed by: anesthesiologist   Preanesthetic Checklist Completed: patient identified, surgical consent, pre-op evaluation, timeout performed, IV checked, risks and benefits discussed and monitors and equipment checked  Epidural Patient position: sitting Prep: site prepped and draped and DuraPrep Patient monitoring: continuous pulse ox and blood pressure Approach: midline Location: L3-L4 Injection technique: LOR air  Needle:  Needle type: Tuohy  Needle gauge: 17 G Needle length: 9 cm and 9 Needle insertion depth: 5 cm cm Catheter type: closed end flexible Catheter size: 19 Gauge Catheter at skin depth: 10 cm Test dose: negative and Other  Assessment Sensory level: T9 Events: blood not aspirated, injection not painful, no injection resistance, negative IV test and no paresthesia  Additional Notes Reason for block:procedure for pain

## 2013-07-06 NOTE — Anesthesia Preprocedure Evaluation (Signed)
Anesthesia Evaluation  Patient identified by MRN, date of birth, ID band Patient awake    Reviewed: Allergy & Precautions, H&P , NPO status , Patient's Chart, lab work & pertinent test results  Airway Mallampati: II TM Distance: >3 FB Neck ROM: full    Dental no notable dental hx.    Pulmonary neg pulmonary ROS, former smoker,    Pulmonary exam normal       Cardiovascular negative cardio ROS      Neuro/Psych negative psych ROS   GI/Hepatic Neg liver ROS,   Endo/Other  negative endocrine ROS  Renal/GU negative Renal ROS     Musculoskeletal   Abdominal Normal abdominal exam  (+)   Peds  Hematology negative hematology ROS (+)   Anesthesia Other Findings   Reproductive/Obstetrics (+) Pregnancy                           Anesthesia Physical Anesthesia Plan  ASA: II  Anesthesia Plan: Epidural   Post-op Pain Management:    Induction:   Airway Management Planned:   Additional Equipment:   Intra-op Plan:   Post-operative Plan:   Informed Consent: I have reviewed the patients History and Physical, chart, labs and discussed the procedure including the risks, benefits and alternatives for the proposed anesthesia with the patient or authorized representative who has indicated his/her understanding and acceptance.     Plan Discussed with:   Anesthesia Plan Comments:         Anesthesia Quick Evaluation

## 2013-07-06 NOTE — H&P (Signed)
Valerie [name] is a 23 y.o. female presenting for UC's. History OB History   Grav Para Term Preterm Abortions TAB SAB Ect Mult Living   3    2  2    0     Past Medical History  Diagnosis Date  . Migraine   . Gastric ulcer   . UTI (urinary tract infection)   . Infection     UTI, chronic sinus inf  . Eczema   . Ovarian cyst   . Abnormal Pap smear    Past Surgical History  Procedure Laterality Date  . Colposcopy    . Myringotomy with tube placement    . Adenoidectomy     Family History: family history includes Cancer in her paternal aunt; Diabetes in her father, paternal grandmother, and paternal uncle; Hypertension in her mother. Social History:  reports that she quit smoking about 7 months ago. Her smoking use included Cigarettes. She smoked 0.50 packs per day. She has never used smokeless tobacco. She reports that she does not drink alcohol or use illicit drugs.   Prenatal Transfer Tool  Maternal Diabetes: No Genetic Screening: Normal Maternal Ultrasounds/Referrals: Normal Fetal Ultrasounds or other Referrals:  None Maternal Substance Abuse:  No Significant Maternal Medications:  None Significant Maternal Lab Results:  None Other Comments:  None  ROS  Dilation: 4.5 Effacement (%): 100 Station: -1 Exam by:: S. Carrera, RNC Blood pressure 105/66, pulse 88, temperature 98.2 F (36.8 C), temperature source Oral, resp. rate 20, height 5\' 5"  (1.651 m), weight 181 lb (82.101 kg). Exam Physical Exam  Prenatal labs: ABO, Rh: O/POS/-- (09/12 1016) Antibody: NEG (09/12 1016) Rubella: 2.22 (09/12 1016) RPR: NON REAC (01/29 1125)  HBsAg: NEGATIVE (09/12 1016)  HIV: NON REACTIVE (01/29 1125)  GBS: NEGATIVE (03/26 1153)   Assessment/Plan: 39.4 weeks.  Active labor.  Admit.   [name] A Romana Deaton 07/06/2013, 5:34 PM

## 2013-07-07 ENCOUNTER — Encounter (HOSPITAL_COMMUNITY): Payer: Self-pay

## 2013-07-07 ENCOUNTER — Encounter: Payer: BC Managed Care – PPO | Admitting: Obstetrics

## 2013-07-07 LAB — CBC
HEMATOCRIT: 28.6 % — AB (ref 36.0–46.0)
Hemoglobin: 9.7 g/dL — ABNORMAL LOW (ref 12.0–15.0)
MCH: 32 pg (ref 26.0–34.0)
MCHC: 33.9 g/dL (ref 30.0–36.0)
MCV: 94.4 fL (ref 78.0–100.0)
Platelets: 157 10*3/uL (ref 150–400)
RBC: 3.03 MIL/uL — ABNORMAL LOW (ref 3.87–5.11)
RDW: 13.8 % (ref 11.5–15.5)
WBC: 21.5 10*3/uL — AB (ref 4.0–10.5)

## 2013-07-07 MED ORDER — MEDROXYPROGESTERONE ACETATE 150 MG/ML IM SUSP
150.0000 mg | INTRAMUSCULAR | Status: DC | PRN
Start: 2013-07-07 — End: 2013-07-09

## 2013-07-07 MED ORDER — ONDANSETRON HCL 4 MG/2ML IJ SOLN: 4.0000 mg | INTRAMUSCULAR | Status: DC | PRN

## 2013-07-07 MED ORDER — DIPHENHYDRAMINE HCL 25 MG PO CAPS: 25.0000 mg | ORAL_CAPSULE | Freq: Four times a day (QID) | ORAL | Status: DC | PRN

## 2013-07-07 MED ORDER — ONDANSETRON HCL 4 MG PO TABS: 4.0000 mg | ORAL_TABLET | ORAL | Status: DC | PRN

## 2013-07-07 MED ORDER — SENNOSIDES-DOCUSATE SODIUM 8.6-50 MG PO TABS
2.0000 | ORAL_TABLET | ORAL | Status: DC
  Administered 2013-07-07 – 2013-07-08 (×2): 2 via ORAL
  Filled 2013-07-07 (×2): qty 2

## 2013-07-07 MED ORDER — BENZOCAINE-MENTHOL 20-0.5 % EX AERO
1.0000 "application " | INHALATION_SPRAY | CUTANEOUS | Status: DC | PRN
  Administered 2013-07-07: 1 via TOPICAL
  Filled 2013-07-07: qty 56

## 2013-07-07 MED ORDER — PRENATAL MULTIVITAMIN CH
1.0000 | ORAL_TABLET | Freq: Every day | ORAL | Status: DC
  Administered 2013-07-07 – 2013-07-08 (×2): 1 via ORAL
  Filled 2013-07-07 (×2): qty 1

## 2013-07-07 MED ORDER — DIBUCAINE 1 % RE OINT: 1.0000 "application " | TOPICAL_OINTMENT | RECTAL | Status: DC | PRN

## 2013-07-07 MED ORDER — OXYCODONE-ACETAMINOPHEN 5-325 MG PO TABS
1.0000 | ORAL_TABLET | ORAL | Status: DC | PRN
  Administered 2013-07-08: 1 via ORAL
  Filled 2013-07-07: qty 1

## 2013-07-07 MED ORDER — TETANUS-DIPHTH-ACELL PERTUSSIS 5-2.5-18.5 LF-MCG/0.5 IM SUSP: 0.5000 mL | Freq: Once | INTRAMUSCULAR | Status: DC

## 2013-07-07 MED ORDER — IBUPROFEN 600 MG PO TABS
600.0000 mg | ORAL_TABLET | Freq: Four times a day (QID) | ORAL | Status: DC
  Administered 2013-07-07 – 2013-07-09 (×8): 600 mg via ORAL
  Filled 2013-07-07 (×8): qty 1

## 2013-07-07 MED ORDER — OXYTOCIN 40 UNITS IN LACTATED RINGERS INFUSION - SIMPLE MED: 62.5000 mL/h | INTRAVENOUS | Status: DC | PRN

## 2013-07-07 MED ORDER — WITCH HAZEL-GLYCERIN EX PADS: 1.0000 "application " | MEDICATED_PAD | CUTANEOUS | Status: DC | PRN

## 2013-07-07 MED ORDER — SIMETHICONE 80 MG PO CHEW: 80.0000 mg | CHEWABLE_TABLET | ORAL | Status: DC | PRN

## 2013-07-07 MED ORDER — LANOLIN HYDROUS EX OINT: TOPICAL_OINTMENT | CUTANEOUS | Status: DC | PRN

## 2013-07-07 MED ORDER — ZOLPIDEM TARTRATE 5 MG PO TABS: 5.0000 mg | ORAL_TABLET | Freq: Every evening | ORAL | Status: DC | PRN

## 2013-07-07 NOTE — Progress Notes (Signed)
Valerie [name] is a 23 y.o. G3P0020 at 6330w5d by LMP admitted for active labor  Subjective:   Objective: BP 103/51  Pulse 78  Temp(Src) 98.6 F (37 C) (Oral)  Resp 18  Ht 5\' 5"  (1.651 m)  Wt 181 lb (82.101 kg)  BMI 30.12 kg/m2  SpO2 100%      FHT:  FHR: 140-150 bpm, variability: moderate,  accelerations:  Present,  decelerations:  Absent UC:   regular, every 2-4 minutes SVE:   Dilation: 9 Effacement (%): 90 Station: -1;0 Exam by:: e. poore, rn  Labs: Lab Results  Component Value Date   WBC 18.0* 07/06/2013   HGB 11.7* 07/06/2013   HCT 34.2* 07/06/2013   MCV 93.2 07/06/2013   PLT 179 07/06/2013    Assessment / Plan: Spontaneous labor, progressing normally  Labor: Progressing normally Preeclampsia:  n/a Fetal Wellbeing:  Category I Pain Control:  Epidural I/D:  n/a Anticipated MOD:  NSVD  [name] A Ranon Coven 07/07/2013, 12:07 AM

## 2013-07-07 NOTE — Lactation Note (Signed)
This note was copied from the chart of Valerie Horald PollenSarah Germany. Lactation Consultation Note comfort gels [facility].    Patient Name: Valerie Horald PollenSarah [name] ZOXWR'UToday's Date: 07/07/2013     Maternal Data    Feeding Feeding Type: Breast Fed Length of feed: 0 min (no latch)  LATCH Score/Interventions Latch: Repeated attempts needed to sustain latch, nipple held in mouth throughout feeding, stimulation needed to elicit sucking reflex. Intervention(s): Adjust position;Assist with latch;Breast massage;Breast compression  Audible Swallowing: None Intervention(s): Skin to skin;Hand expression  Type of Nipple: Everted at rest and after stimulation  Comfort (Breast/Nipple): Soft / non-tender     Hold (Positioning): Assistance needed to correctly position infant at breast and maintain latch. Intervention(s): Breastfeeding basics reviewed;Support Pillows;Position options;Skin to skin  LATCH Score: 6  Lactation Tools Discussed/Used     Consult Status      Arvella MerlesJana Lynn [facility]w 07/07/2013, 9:15 PM

## 2013-07-07 NOTE — Progress Notes (Signed)
Valerie [name] is a 23 y.o. G3P0020 at 8366w5d by LMP admitted for active labor  Subjective:   Objective: BP 117/81  Pulse 93  Temp(Src) 98.3 F (36.8 C) (Oral)  Resp 18  Ht 5\' 5"  (1.651 m)  Wt 181 lb (82.101 kg)  BMI 30.12 kg/m2  SpO2 100%      FHT:  FHR: 140 bpm, variability: moderate,  accelerations:  Present,  decelerations:  Present variable UC:   regular, every 2-4 minutes SVE:   Dilation: 10 Effacement (%): 100 Station: +2 Exam by:: e. poore, rn  Labs: Lab Results  Component Value Date   WBC 18.0* 07/06/2013   HGB 11.7* 07/06/2013   HCT 34.2* 07/06/2013   MCV 93.2 07/06/2013   PLT 179 07/06/2013    Assessment / Plan: Spontaneous labor, progressing normally  Labor: Progressing normally Preeclampsia:  n/a Fetal Wellbeing:  Category I Pain Control:  Epidural I/D:  n/a Anticipated MOD:  NSVD  [name] A Harper 07/07/2013, 2:16 AM

## 2013-07-07 NOTE — Anesthesia Postprocedure Evaluation (Signed)
Anesthesia Post Note  Patient: Valerie [name]  Procedure(s) Performed: * No procedures listed *  Anesthesia type: Epidural  Patient location: Mother/Baby  Post pain: Pain level controlled  Post assessment: Post-op Vital signs reviewed  Last Vitals:  Filed Vitals:   07/07/13 1015  BP: 112/64  Pulse: 84  Temp: 36.8 C  Resp: 18    Post vital signs: Reviewed  Level of consciousness:alert  Complications: No apparent anesthesia complications

## 2013-07-07 NOTE — Lactation Note (Signed)
This note was copied from the chart of Valerie Horald PollenSarah Korzeniewski. Lactation Consultation Note Follow up consult:  Baby recently bf on left breast for 20 min. Mother attempted to bf in fb on right breast.  Reviewed hand expression, good drops of colostrum expressed. Baby would not latch, seems full, he is stooling.   Mother states she thought he had to bf on both breasts every feeding.  Reviewed that she should switch sides each feeding and does not have to bf on both sides unless he seems hungry.     Mother states she is starting to get sore.  Reviewed breast compression to get him on deeper.   Patient Name: Valerie Horald PollenSarah Kitko ZOXWR'UToday's Date: 07/07/2013 Reason for consult: Follow-up assessment   Maternal Data Has patient been taught Hand Expression?: Yes  Feeding Feeding Type: Breast Fed  LATCH Score/Interventions                      Lactation Tools Discussed/Used     Consult Status Consult Status: Follow-up Date: 07/08/13 Follow-up type: In-patient    Dulce SellarRuth Boschen Chizaram Latino 07/07/2013, 12:09 PM

## 2013-07-08 ENCOUNTER — Encounter: Payer: BC Managed Care – PPO | Admitting: Obstetrics

## 2013-07-08 NOTE — Lactation Note (Signed)
This note was copied from the chart of Boy Horald PollenSarah Hoskin. Lactation Consultation Note  Patient Name: Boy Horald PollenSarah Mcmonigle ZOXWR'UToday's Date: 07/08/2013 Reason for consult: Follow-up assessment;Breast/nipple pain with some improvement of latching and cluster feeding tonight.  Baby was rooting and mom places him STS on (L) breast using lots of pillow support.  Baby latches quickly and starts rhythmical sucking right away with early swallows noted.  Mom knows how to stimulate baby at intervals if sleepy and states that there is some nipple tenderness but improved when baby has wider jaw excursions.  LC encouraged mom to use breast compression to encourage more effective sucking.  LC discussed basic breastfeeding information ,including reasons to avoid pacifier, bottle and/or formula for a minimum of 2 weeks.  LC also reviewed engorgement care and mom will continue feeding on cue and wearing comfort gelpads between feedings to help nipples heal.   Maternal Data    Feeding Feeding Type: Breast Fed Length of feed: 11 min  LATCH Score/Interventions              LATCH score=8 per LC assessment        Lactation Tools Discussed/Used Tools: Comfort gels STS, cue feedings, cluster feedings, signs of proper latch and effective sucking/milk transfer  Consult Status Consult Status: Follow-up Date: 07/09/13 Follow-up type: In-patient    Zara ChessJoanne P Chynah Orihuela 07/08/2013, 9:45 PM

## 2013-07-08 NOTE — Progress Notes (Signed)
Post Partum Day 1 Subjective: no complaints  Objective: Blood pressure 100/64, pulse 93, temperature 98.1 F (36.7 C), temperature source Oral, resp. rate 18, height 5\' 5"  (1.651 m), weight 181 lb (82.101 kg), SpO2 99.00%, unknown if currently breastfeeding.  Physical Exam:  General: alert and no distress Lochia: appropriate Uterine Fundus: firm Incision: none DVT Evaluation: No evidence of DVT seen on physical exam.   Recent Labs  07/06/13 1645 07/07/13 0652  HGB 11.7* 9.7*  HCT 34.2* 28.6*    Assessment/Plan: Plan for discharge tomorrow   LOS: 2 days   Valerie [name] A Valerie [name] 07/08/2013, 4:06 PM

## 2013-07-08 NOTE — Lactation Note (Signed)
This note was copied from the chart of Valerie [name] Hora. Lactation Consultation Note Mom tearful, hot, sweating. Explained about the hormones. Baby cluster feeding, mom tired. Noted small nipples and breast, easily compresses. Encouraged chin tug, tea cup pinch and foot ball hold for deep latches. Noted getting shallow latch, basics reviewed for positioning. Stated feels better. Encouraged to rub colostrum to sore nipples and wear comfort gels after feedings. Baby noted lots of pees and poops. Mom feels better and bonding well when left rm. Patient Name: Valerie [name] Niese WJXBJ'YToday's Date: 07/08/2013 Reason for consult: Follow-up assessment;Breast/nipple pain   Maternal Data    Feeding Feeding Type: Breast Fed Length of feed: 10 min  LATCH Score/Interventions Latch: Repeated attempts needed to sustain latch, nipple held in mouth throughout feeding, stimulation needed to elicit sucking reflex. Intervention(s): Adjust position;Assist with latch;Breast massage;Breast compression  Audible Swallowing: A few with stimulation Intervention(s): Skin to skin;Hand expression Intervention(s): Skin to skin;Hand expression  Type of Nipple: Everted at rest and after stimulation (small nipples/compresses well)  Comfort (Breast/Nipple): Filling, red/small blisters or bruises, mild/mod discomfort  Problem noted: Mild/Moderate discomfort Interventions (Mild/moderate discomfort): Hand massage;Hand expression;Comfort gels  Hold (Positioning): Assistance needed to correctly position infant at breast and maintain latch. Intervention(s): Support Pillows;Skin to skin  LATCH Score: 6  Lactation Tools Discussed/Used Tools: Comfort gels   Consult Status Consult Status: Follow-up Date: 07/08/13 Follow-up type: In-patient    Charyl DancerLaura G Naliyah Neth 07/08/2013, 3:04 AM

## 2013-07-09 MED ORDER — IBUPROFEN 600 MG PO TABS: 600.0000 mg | ORAL_TABLET | Freq: Four times a day (QID) | ORAL | Status: DC

## 2013-07-09 MED ORDER — MAGNESIUM HYDROXIDE 400 MG/5ML PO SUSP
30.0000 mL | Freq: Once | ORAL | Status: AC
  Administered 2013-07-09: 30 mL via ORAL
  Filled 2013-07-09: qty 30

## 2013-07-09 MED ORDER — OXYCODONE-ACETAMINOPHEN 5-325 MG PO TABS: 1.0000 | ORAL_TABLET | ORAL | Status: DC | PRN

## 2013-07-09 NOTE — Discharge Summary (Signed)
Obstetric Discharge Summary Reason for Admission: onset of labor Prenatal Procedures: ultrasound Intrapartum Procedures: spontaneous vaginal delivery Postpartum Procedures: none Complications-Operative and Postpartum: none Hemoglobin  Date Value Ref Range Status  07/07/2013 9.7* 12.0 - 15.0 g/dL Final     DELTA CHECK NOTED     REPEATED TO VERIFY     HCT  Date Value Ref Range Status  07/07/2013 28.6* 36.0 - 46.0 % Final    Physical Exam:  General: alert and no distress Lochia: appropriate Uterine Fundus: firm Incision: none DVT Evaluation: No evidence of DVT seen on physical exam.  Discharge Diagnoses: Term Pregnancy-delivered  Discharge Information: Date: 07/09/2013 Activity: pelvic rest Diet: routine Medications: PNV, Ibuprofen, Colace, Percocet and Milk of Magnesia Condition: stable Instructions: refer to practice specific booklet Discharge to: home Follow-up Information   Follow up with Delesa Kawa A, MD In 2 weeks.   Specialty:  Obstetrics and Gynecology   Contact information:   [address] [place] [id number] [phone]       Newborn Data: Live born female  Birth Weight: 6 lb 4.5 oz (2850 g) APGAR: 9, 9  Home with mother.  [name] A Amy Belloso 07/09/2013, 9:00 AM

## 2013-07-09 NOTE — Progress Notes (Signed)
Post Partum Day 2 Subjective: no complaints, up ad lib, voiding, tolerating PO and + flatus  Objective: Blood pressure 90/57, pulse 87, temperature 97.8 F (36.6 C), temperature source Oral, resp. rate 16, height 5\' 5"  (1.651 m), weight 181 lb (82.101 kg), SpO2 99.00%, unknown if currently breastfeeding.  Physical Exam:  General: alert and no distress Lochia: appropriate Uterine Fundus: firm Incision: none DVT Evaluation: No evidence of DVT seen on physical exam.   Recent Labs  07/06/13 1645 07/07/13 0652  HGB 11.7* 9.7*  HCT 34.2* 28.6*    Assessment/Plan: Discharge home   LOS: 3 days   Valerie [name] 07/09/2013, 8:44 AM

## 2013-07-09 NOTE — Lactation Note (Signed)
This note was copied from the chart of [name]. Lactation Consultation Note  Patient Name: [name] ZOXWR'UToday's Date: 07/09/2013 Reason for consult: Follow-up assessment;Breast/nipple pain Mom has positional stripes bilateral, but does report less discomfort with latch that yesterday. Mom reports discomfort with initial latch that improves as the baby nurses. Assisted Mom in side lying position, after few attempts baby latched well, demonstrated how to bring bottom lip down. Baby demonstrated a good rhythmic suck with audible swallows. Care for sore nipples reviewed. Comfort gels given with instructions. Advised to BF every 2-3 hours, 8-12 times in 24 hours. Monitor voids/stools. Engorgement care reviewed if needed. Advised of OP services and support group.   Maternal Data    Feeding Feeding Type: Breast Fed Length of feed: 5 min  LATCH Score/Interventions Latch: Repeated attempts needed to sustain latch, nipple held in mouth throughout feeding, stimulation needed to elicit sucking reflex. Intervention(s): Adjust position;Assist with latch;Breast massage;Breast compression  Audible Swallowing: Spontaneous and intermittent  Type of Nipple: Everted at rest and after stimulation  Comfort (Breast/Nipple): Engorged, cracked, bleeding, large blisters, severe discomfort Problem noted: Cracked, bleeding, blisters, bruises Intervention(s): Expressed breast milk to nipple  Problem noted: Mild/Moderate discomfort (positional stripes bilateral. ) Interventions (Mild/moderate discomfort): Comfort gels  Hold (Positioning): Assistance needed to correctly position infant at breast and maintain latch. Intervention(s): Breastfeeding basics reviewed;Support Pillows;Position options;Skin to skin  LATCH Score: 6  Lactation Tools Discussed/Used Tools: Pump;Comfort gels Breast pump type: Manual   Consult Status Consult Status: Complete Date: 07/09/13 Follow-up type:  In-patient    [name] 07/09/2013, 10:50 AM

## 2013-07-09 NOTE — Discharge Instructions (Signed)
Patient information: Common breastfeeding problems (Beyond the Basics)  Author Lenord Carbo, MD Section Editor Edwyna Ready, MD Deputy Editors Thornton Papas, MD Hoy Morn, PhD Disclosures: Lenord Carbo, MD Nothing to disclose. Edwyna Ready, MD Grant/Research/Clinical Trial Support: [employer] (infant nutrition [specialized formulas for preterm infants]). Thornton Papas, MD Employee of [employer] Hoy Morn, PhD Employee of [employer]  Contributor disclosures are reviewed for conflicts of interest by the editorial group. When found, these are addressed by vetting through a multi-level review process, and through requirements for references to be provided to support the content. Appropriately referenced content is required of all authors and must conform to [employer] standards of evidence.  Conflict of interest policy  All topics are updated as new evidence becomes available and our peer review process is complete.  Literature review current through: Apr 2014.   This topic last updated: Jul 13, 2012.  BREASTFEEDING PROBLEMS OVERVIEW -- Breastfeeding is universally recognized as the best way to feed an infant because it protects mother and infant from a variety of health problems. Even so, many women who start out breastfeeding stop before the recommended minimum of exclusive breastfeeding for six months. Often women stop because common problems interfere with their ability to breastfeed. Luckily, with sound guidance and appropriate medical treatment, most women can overcome these obstacles and continue breastfeeding for longer periods. This topic discusses common problems associated with breastfeeding and how to handle them. Other aspects of breastfeeding are discussed elsewhere. (See "Patient information: Deciding to breastfeed (Beyond the Basics)" and "Patient information: Breastfeeding guide (Beyond the Basics)" and "Patient information: Maternal health and nutrition  during breastfeeding (Beyond the Basics)" and "Patient information: Breast pumps (Beyond the Basics)".) INADEQUATE MILK INTAKE -- The most common reason women stop breastfeeding is that they think their infant is not getting enough milk, but in many cases the mother has an adequate supply. A true inadequate supply can happen if the infant is unable to extract milk well or if the mother doesnt make enough milk. Unfortunately, figuring out if a mother has enough milk and if not, why not, can be challenging. Inadequate milk production -- There are a number of reasons why a mother might not make enough milk, including that: ?Her breasts did not develop sufficiently during pregnancy - This can happen if she doesnt have enough milk-producing tissue (called glandular tissue) (figure 1). ?She previously had breast surgery or radiation treatment. ?She has a hormonal imbalance. ?She takes certain medications that interfere with milk production. Women who have had breast surgery, such a breast augmentation or breast reduction surgery, often have trouble making enough milk. For some, breastfeeding is impossible. If you had breast surgery, ask your healthcare provider if the type of surgery you had would totally interfere with breastfeeding. If not, or if you are unable to get complete information on your surgery, do go ahead and try, but make sure your healthcare provider closely monitors your babys progress. Poor milk extraction -- The most common reasons infants have trouble getting enough milk are: ?They do not get fed frequently enough (which can cause milk production to slow or stop) ?They cannot latch on properly (figure 2) ("A Perfect Latch" video; for a good demonstration of latch, place the timer of the video on minute 7:43) ?They are separated from their mother too much ?They are fed formula Many babies are sleepy and difficult to keep awake during the first several days after birth. This can prevent  the baby from  getting enough to eat. Other babies can have trouble controlling the muscles involved in suckling, which makes it hard for them to extract milk. Feeding difficulty is especially common among premature and late preterm babies. Many mothers judge adequacy of feeding by lack of crying. This can be misleading if the baby is not getting enough milk and is overly sleepy. Diagnosis of inadequate intake -- Healthcare providers determine whether a baby is getting enough milk based on the following: ?Number of feeding sessions the mother reports having - During the first week of life, mothers with term infants (meaning they are not premature) generally nurse 8 to 12 times in 24 hours. By four weeks after delivery, nursing usually decreases to seven to nine times per day. ?Amount of urine and stool the baby makes - By the fifth day of life, infants who are getting enough milk urinate six to eight times a day and have three or more stools a day. (Once a mothers milk comes in, her infants stool should be pale yellow and seedy.) ?Weight of the baby - Term infants lose an average of 7 percent of their birth weight in the first three to five days of life. They typically get back to their birth weight within one to two weeks. Once a mother's breasts fill with milk - by day three to five - her infant should not keep losing weight. If an infant has lost 10 percent of its weight or fails to return to its birth weight when expected, healthcare providers start to explore potential problems. Household scales are not accurate enough to detect these small weight differences. If you are using a medical scale for infants, remember to weigh the infant with the same clothes and diaper before and after the feeding. Management of inadequate intake -- If your healthcare provider suspects your baby is not getting enough milk, he or she will want to figure out why. To do that, the healthcare provider will ask you about your  experiences breastfeeding and about your and your babys medical history. A healthcare provider should also watch as you try to breastfeed to see if there could be something wrong with the way your baby latches on or with the babys mouth. If so, it will be important for you to learn how to position your baby so that the baby can latch on properly (figure 2 and figure 3). If you are having trouble with this, the healthcare provider will direct you to community resources ? often a Advertising copywriterlactation consultant ? for assistance. If your baby has a good latch, but you still have problems with inadequate milk intake, your healthcare provider might suggest that you try to feed more often or try to stimulate more milk production by using a breast pump or expressing by hand (figure 4) ("Hand Expression of Breastmilk" video). There are medications called galactagogues (or lactagogues) that supposedly increase milk production, but its unclear whether these medications actually work and whether they are safe for a nursing baby, so we do not recommend their use. NIPPLE AND BREAST PAIN -- The second most common reason mothers stop breastfeeding early is nipple or breast pain. The causes of nipple and breast pain include: ?Nipple injury (caused by the baby or a breast pump) ?Engorgement, which means the breasts get overly full ?Plugged milk ducts ?Nipple and breast infections ?Excessive milk supply ?Skin disorders (such as dermatitis or psoriasis) affecting the nipple ?Nipple vasoconstriction, which means the blood vessels in the nipple tighten and do not  let enough blood through Possible causes of breast or nipple pain related to the baby could include: ?Ankyloglossia (also called tongue-tie), which is when the babys tongue cannot move as freely as it should, making it hard for the baby to suckle effectively ?Torticollis, which is when the babys neck is twisted, making it hard for the baby to nurse from both breasts  comfortably ?Birth defects in the shape of the babys mouth that make it hard for the baby to latch on ?Uncoordinated suck, which is when the baby does not move its tongue in the correct rhythm to extract milk To determine the cause of your pain, your healthcare provider will examine you and your baby, and watch you breastfeed. He or she will also ask about your pain (when it started, what makes it better or worse), and about aspects of your health that could hold clues about the cause of your pain. The most important part of the exam takes place when the healthcare provider watches you breastfeed. Thats because most cases of breast pain in the nursing mother are due to incorrect breastfeeding technique. One common problem is that the baby is not latching on properly, and so injures the nipple, but also cannot empty the breast. This, in turn, can lead to engorgement, plugged ducts, and breast infections. Nipple pain -- Sore nipples are one of the most common complaints by new mothers. Pain due to nipple injury needs to be distinguished from nipple sensitivity, which normally increases during pregnancy and peaks about four days after giving birth. You can usually tell the difference between normal nipple sensitivity and pain caused by nipple injury based on when it happens and how it changes over time. Normal sensitivity typically subsides 30 seconds after suckling begins. It also diminishes on the fourth day after giving birth and completely resolves when the baby is about a week old. Nipple pain caused by trauma, on the other hand, persists or gets worse after suckling begins. Severe pain or pain that continues after the first week after birth is more likely to be due to nipple injury. Normal nipple sensitivity -- If you have some discomfort related to normal nipple sensitivity, keep in mind that this sensitivity usually goes away after the first few suckles of a feeding, and stops happening after the first  week or two of nursing. If you find the pins and needles sensation of milk let-down to be uncomfortable, rest assured this discomfort also resolves in the first weeks of breastfeeding. If needed, you can take acetaminophen (sample brand name: Tylenol) to ease your discomfort. Nipple injury -- Nipple injury usually is due to incorrect breastfeeding technique, particularly poor position or latch-on. Other factors that can make pain caused by injury worse include harsh breast cleansing, use of potentially irritating products, and biting by an older infant. Here are some things you can do to prevent nipple injury: ?Learn how to position your baby so that the baby can latch on properly (figure 2 and figure 3). If you are having trouble with this, get help from a healthcare provider or a Advertising copywriter. ?Try to keep your nipples dry, and allow them to air-dry after feedings. ?Do not use harsh soaps or cleansers on your breasts. ?If your babys mouth has any abnormalities, make sure to have them addressed as soon as possible. For example, if your baby has tongue-tie, surgery to release the tongue will make it easier for the baby to latch on properly. ?If your baby is biting you,  position the baby so that his or her mouth is wide open during feedings. That will make it harder to bite. Also, stick your finger between your nipple and the babys mouth any time he or she bites you and firmly say no. Then put the baby down in a safe place. The baby will learn not to bite you. Here are some things you can do to promote healing if your nipples are already injured: ?Always start nursing with the breast that does not have the injury. ?If your nipples are cracked or raw, put an ointment on them, such as Vitamin A and D or purified lanolin (if you are not allergic) and cover them with a nonstick pad. This will help prevent nipple infection and keep the injured part of your nipple from sticking to your bra. If you  think your nipple is infected or you have a rash, see your healthcare provider. ?Use cool or warm compresses, if they seem to help. Avoid ice. ?Take a mild pain reliever, such as acetaminophen (brand name Tylenol) or ibuprofen (sample brand names: Advil, Motrin), before feeding. ?If nipple pain prevents your baby from emptying your breasts, try using a pump or hand expression to empty your breasts. This will give your nipples a chance to heal and prevent engorgement. Use the milk you remove to feed your baby. ?Do NOT use vitamin E oil on your nipples. At high levels, it could be toxic to your baby. Nipple vasoconstriction -- Nipple vasoconstriction is when the blood vessels in the nipple tighten and do not let enough blood through. Mothers with this problem can have pain, burning, or numbness in their nipples in response to cold, nursing, or injury. The nipples can also turn white or blue, and then pink when the blood returns. One way to tell nipple vasoconstriction apart from other causes of nipple pain is that it can be predictably triggered by cold, while other causes of pain cannot. To manage nipple vasoconstriction, try to keep your whole body warm and dress warmly. Also, if possible, try to breastfeed in warm conditions. It might also help to avoid nicotine and caffeine, as they can make the problem worse. Engorgement -- Engorgement is the medical term for when the breasts get too full of milk. It can make your breast feel full and firm and can cause pain and tenderness. Engorgement can sometimes impair the babys ability to latch, which makes engorgement worse, because the baby cannot then empty the breast. Here are some things you can do to prevent and deal with engorgement: ?Learn how to position your baby so that the baby can latch on properly (figure 2 and figure 3). If you are having trouble with this, get help from a healthcare provider or a Advertising copywriter. ?If the engorgement makes it  hard for your baby to latch on, manually express a small amount of milk before each feeding to soften your areola and make it easier for the baby to latch on (figure 4). To do this, place your thumb and forefingers well behind your areola (close to your chest) and then compress them together and toward your nipple in a rhythmic fashion. You can also use your hand to present your nipple in a way that is easier to latch and to help get milk out for the baby while the baby is suckling. ?You can use a breast pump to help soften your breast before a feeding, but be careful not to do it too much. Using a pump  too much will stimulate your breast to make even more milk, which will make engorgement worse. ?Apply warm compresses or take a warm shower before a feeding. This can enhance let-down and may make it easier to get milk out. ?Take a mild pain reliever, such as acetaminophen (brand name Tylenol) or ibuprofen (sample brand names: Advil, Motrin). Plugged ducts -- A plugged milk duct can cause a tender or painful lump to form on the breast. If the nipple itself is plugged, a white dot or bleb can form at the end of the nipple. Things that can lead to a plugged milk duct include poor feeding technique, wearing tight clothing or an ill-fitting bra, abrupt decrease in feeding, engorgement, and infections. Here are some things you can do to prevent and deal with a plugged duct: ?Learn how to position your baby so that the baby can latch on properly (figure 2 and figure 3). If you are having trouble with this, get help from a healthcare provider or a Advertising copywriter. Make sure to vary your position during feedings so every part of the breast gets emptied. You might even try to position the baby so that his or her chin is near the plugged area, because this positioning can help drain that area best. You can also try pumping or manually expressing after feedings to improve drainage. Do not stop breastfeeding, as this  could lead to engorgement and worsen the problem. ?Try using warm compresses or taking a warm shower and then manually massaging your breast from the outer part of your breast toward the nipple to advance the blockage toward the nipple or in the opposite direction to clear the duct. ?Take a mild pain reliever, such as acetaminophen (brand name Tylenol) or ibuprofen (sample brand names: Advil, Motrin). ?If your blockage does not get better within two days, see your healthcare provider, as what appears to be a blocked duct may be something more concerning. Galactoceles -- Sometimes a blocked milk duct can cause a milk-filled cyst called a galactocele to form (picture 1). Unless they are infected, galactoceles are usually painless, but they can get quite large. If necessary, a healthcare provider can drain a galactocele using a needle, or suggest surgery if the problem is severe. BREAST INFECTIONS Lactational mastitis -- Mastitis is an inflammation of the breast that is often associated with fever (which might be masked by pain medications), muscle and breast pain, and redness. It is not always caused by an infection, but most people associate it with infection. Mastitis can happen at any time during lactation, but it is most common during the first six weeks after delivery. Mastitis tends to occur if the nipples are damaged or the breasts stay engorged for too long or do not drain properly. To prevent and treat mastitis, its important to get these problems under control. If you have any of the following symptoms, see your healthcare provider: ?A firm, red, and tender area of the breast ?Fever higher than 101?F or 38.5?C ?Muscle aches, chills, malaise, or flu-like symptoms If you do have an infection, your healthcare provider will probably put you on antibiotics. Here are some things you can do to manage mastitis: ?Take your antibiotics exactly as directed, if your healthcare provider prescribes them. If  you dont start to feel better within two to three days of starting antibiotics, call your healthcare provider. You may need a different antibiotic or may have a different problem. ?Continue breastfeeding, even while you are being treated, and work on  your feeding technique, so that your breasts empty well. ?Take a mild pain reliever, such as acetaminophen (sample brand name Tylenol) or ibuprofen (sample brand names: Advil, Motrin), if you think it could help. ?Apply cold compresses or ice packs. Yeast infection -- Many women who are breastfeeding are diagnosed with a yeast infection of the nipple or breast (also called candidal infection) based on their symptoms (primarily nipple pain). Even so, yeast infections of the nipple or breast are poorly understood, and researchers arent sure what role they play in nipple pain. For now, healthcare providers often diagnose yeast infections in women who have: ?Breast pain out of proportion to any apparent cause ?A history of vaginal yeast infections or an infant with a history of yeast infections such as thrush or diaper rash ?Shiny or flaky skin on the affected nipple ?Skin scrapings from the nipple or areola that test positive for yeast (or breast milk that does) Treatments include: ?Topical antifungals - Topical antifungals are creams or gels that contain medications called antifungals, which kill yeast. Women using these agents must wipe any remaining medication they can see off their nipples before each feeding and reapply them after each feeding. Antifungal ointments are not recommended, as the paraffins they contain could be harmful to the infant. ?Gentian violet - Gentian violet 0.25 to 1% is a purple medication that you apply to your babys mouth with a Q-tip before a feeding. After the feeding, you apply more gentian violet to any areas of your nipple and areola that are not already purple. You do this once a day for three to four days. Gentian violet  is a bit messy and can sometimes irritate the babys mouth or the nipple area, but it is effective. ?Antifungal pills - Mothers who do not get better with the options described above can be put on prescription antifungal pills. They can continue to breastfeed while on these pills, as the typical amount of drug that makes it into breast milk is safe for breastfeeding infants. BLOODY NIPPLE DISCHARGE -- Some women have bloody nipple discharge during the first days to weeks of lactation. This is more common with the first pregnancy and has been called rusty pipe syndrome. It is thought to be caused by the increased blood flow to the breasts and ducts that happens when the mother starts making milk. The color of the milk varies from pink to red and generally resolves within a few days. Women who have bloody discharge for more than a week should be seen by a healthcare provider. MILK OVERSUPPLY -- Some mothers make too much milk, which paradoxically can make breastfeeding difficult. Generally the production of milk is determined by the infant's demand, but in this case the supply exceeds demand. The problem begins early in lactation and is most common among women having their first child. In women with an oversupply of milk, the rush of the milk can be so strong that it causes the infant to choke and cough and have trouble feeding, or even to bite down to clamp the nipple. Infants whose mothers make too much milk can either gain weight quickly or gain too little weight because they cannot handle the flow of milk, or because they do not get the last of the milk in the breast, which has the most calories. If you have a problem with overproduction, dont worry. The problem usually goes away on its own. But tell your healthcare provider about it, so he or she can check whether you  have any hormonal imbalances or take any medications that could make the problems worse. Here are some things you can do to deal with milk  oversupply: ?Nurse in an upright position - Hold your baby upright to nurse and lean back or lie on your side (figure 3 and picture 2). This will give the baby better control of the flow of milk. ?Use your fingers to reduce the flow of milk - Try putting a scissors-hold on your areola or pressing on your breast with the heel of your hand to restrict flow. ?Give baby control - Let your baby interrupt feedings, and burp him or her often. ?Pump very little or not at all - Avoid pumping, because it can stimulate even more milk production, but you can hand-pump a little at the beginning of a feeding to relieve some of the pressure. ?Apply cold water or ice to your nipples to decrease leaking WHEN TO SEEK HELP -- If you are unable to breastfeed due to engorgement, pain, or difficulty latching your infant, help is available. Talk to your obstetrical or pediatric healthcare provider, nurse, lactation consultant, or a breastfeeding counselor. (See 'Finding a lactation consultant' below.) WHERE TO GET MORE INFORMATION -- Your healthcare provider is the best source of information for questions and concerns related to your medical problem.Postpartum Care After Vaginal Delivery After you deliver your baby, you will stay in the hospital for 24 to 72 hours, unless there were problems with the labor or delivery, or you have medical problems. While you are in the hospital, you will receive help and instructions on how to care for yourself and your baby. Your doctor will order pain medicine, in case you need it. You will have a small amount of bleeding from your vagina and should change your sanitary pad frequently. Wash your hands thoroughly with soap and water for at least 20 seconds after changing pads and using the toilet. Let the nurses know if you begin to pass blood clots or your bleeding increases. Do not flush blood clots down the toilet before having the nurse look at them, to make sure there is no placental tissue  with them. If you had an intravenous, it will be removed within 24 hours, if there are no problems. The first time you get out of bed or take a shower, call the nurse to help you because you may get weak, lightheaded, or even faint. If you are breastfeeding, you may feel painful contractions of your uterus for a couple of weeks. This is normal. The contractions help your uterus get back to normal size. If you are not breastfeeding, wear a tight, binding bra and decrease your fluid intake. You may be given a medicine to dry up the milk in your breasts. Hormones should not be given to dry up the breasts, because they can cause blood clots. You will be given your normal diet, unless you have diabetes or other medical problems.  The nurses may put an ice pack on your episiotomy (surgically enlarged opening), if you have one, to reduce the pain and puffiness (swelling). On rare occasions, you may not be able to urinate and the nurse will need to empty your bladder with a catheter. If you had a postpartum tubal ligation ("tying tubes," female sterilization), it should not make your stay in the hospital longer. You may have your baby in your room with you as much as you like, unless you or the baby has a problem. Use the bassinet (basket) for  the baby when going to and from the nursery. Do not carry the baby. Do not leave the postpartum area. If the mother is Rh negative (lacks a protein on the red blood cells) and the baby is Rh positive, the mother should get a Rho-gam shot to prevent Rh problems with future pregnancies. You may be given written instructions for you and your baby, and necessary medicines, when you are discharged from the hospital. Be sure you understand and follow the instructions as advised. HOME CARE INSTRUCTIONS AFTER YOUR DELIVERY:  Follow instructions and take the medicines given to you.   Only take over-the-counter or prescription medicines for pain, discomfort, or fever as directed by  your caregiver.   Do not take aspirin, because it can cause bleeding.   Increase your activities a little bit every day to build up your strength and endurance.   Do not drink alcohol, especially if you are breastfeeding or taking pain medicine.   Take your temperature twice a day and record it.   You may have a small amount of bleeding or spotting for 2 to 4 weeks. This is normal.   Do not use tampons or douche. Use sanitary pads.   Try to have someone stay and help you for a few days when you go home.   Try to rest or take a nap when the baby is sleeping.   If you are breastfeeding, wear a good support bra. If you are not breastfeeding, wear a tight bra, do not stimulate your nipples, and decrease your fluid intake.   Eat a healthy, nutritious diet and continue to take your prenatal vitamins.   Do not drive, do any heavy activities or travel until your caregiver tells you it is OK.   Do not have intercourse until your caregiver gives you permission to do so.   Ask your caregiver when you can begin to exercise and what type of exercises to do.   Call your caregiver if you think you are having a problem from your delivery.   Call your pediatrician if you are having a problem with the baby.   Schedule your postpartum visit and keep it.  SEEK MEDICAL CARE IF:  You have a temperature of 100 F (37.8 C) or higher.   You have increased vaginal bleeding or are passing clots. Save any clots to show your caregiver.   You have bloody urine, or pain when you urinate.   You have a bad smelling vaginal discharge.   You have increasing pain or swelling on your episiotomy (surgically enlarged opening).   You develop a severe headache.   You feel depressed.   The episiotomy is separating.   You become dizzy or lightheaded.   You develop a rash.   You have a reaction or problems with your medicine.   You have pain, redness, and/or swelling at the intravenous site.

## 2013-07-22 ENCOUNTER — Ambulatory Visit: Payer: BC Managed Care – PPO | Admitting: Obstetrics

## 2013-07-22 ENCOUNTER — Encounter: Payer: Self-pay | Admitting: Obstetrics

## 2013-07-22 ENCOUNTER — Ambulatory Visit (INDEPENDENT_AMBULATORY_CARE_PROVIDER_SITE_OTHER): Payer: BC Managed Care – PPO | Admitting: Obstetrics

## 2013-07-22 DIAGNOSIS — Z3009 Encounter for other general counseling and advice on contraception: Secondary | ICD-10-CM

## 2013-07-22 NOTE — Progress Notes (Signed)
Subjective:     Valerie [name] is a 23 y.o. female who presents for a postpartum visit. She is 2 weeks postpartum following a spontaneous vaginal delivery. I have fully reviewed the prenatal and intrapartum course. The delivery was at 39 gestational weeks. Outcome: spontaneous vaginal delivery. Anesthesia: epidural. Postpartum course has been normal. Baby's course has been normal. Baby is feeding by breast. Bleeding thin lochia. Bowel function is normal. Bladder function is normal. Patient is not sexually active. Contraception method is abstinence. Postpartum depression screening: negative.  The following portions of the patient's history were reviewed and updated as appropriate: allergies, current medications, past family history, past medical history, past social history, past surgical history and problem list.  Review of Systems A comprehensive review of systems was negative.   Objective:    BP 117/88  Pulse 84  Temp(Src) 98.4 F (36.9 C)  Wt 165 lb (74.844 kg)  General:  alert and no distress PE:  Deferred     Assessment:    2 weeks postpartum.  Doing well.  Counseling for contraception.  Wants Nexplanon.  Plan:    1. Contraception: Nexplanon 2. Nexplanon Rx 3. Follow up in: 4 weeks or as needed.

## 2013-07-23 ENCOUNTER — Encounter: Payer: Self-pay | Admitting: Obstetrics

## 2013-08-04 ENCOUNTER — Telehealth: Payer: Self-pay | Admitting: *Deleted

## 2013-08-04 NOTE — Telephone Encounter (Signed)
Patient called reporting cracked nipple- can she use Bactrim on it while she is nursing?

## 2013-08-19 ENCOUNTER — Encounter: Payer: Self-pay | Admitting: Obstetrics

## 2013-08-19 ENCOUNTER — Ambulatory Visit (INDEPENDENT_AMBULATORY_CARE_PROVIDER_SITE_OTHER): Payer: BC Managed Care – PPO | Admitting: Obstetrics

## 2013-08-19 ENCOUNTER — Ambulatory Visit: Payer: BC Managed Care – PPO | Admitting: Obstetrics

## 2013-08-19 VITALS — BP 116/78 | HR 73 | Temp 97.5°F | Ht 64.0 in | Wt 166.0 lb

## 2013-08-19 DIAGNOSIS — Z3009 Encounter for other general counseling and advice on contraception: Secondary | ICD-10-CM

## 2013-08-19 DIAGNOSIS — IMO0001 Reserved for inherently not codable concepts without codable children: Secondary | ICD-10-CM

## 2013-08-19 DIAGNOSIS — Z3202 Encounter for pregnancy test, result negative: Secondary | ICD-10-CM

## 2013-08-19 NOTE — Progress Notes (Signed)
Subjective:    Valerie [name] is a 23 y.o. female who presents for contraception counseling. The patient has no complaints today. The patient is sexually active. Pertinent past medical history: none.  The information documented in the HPI was reviewed and verified.  Menstrual History: OB History   Grav Para Term Preterm Abortions TAB SAB Ect Mult Living   3 1 1  2  2   1        No LMP recorded.   Patient Active Problem List   Diagnosis Date Noted  . Normal delivery 07/07/2013  . Active labor 07/06/2013  . Backache 07/05/2013  . BV (bacterial vaginosis) 12/24/2012  . Former smoker, stopped smoking in distant past 11/27/2012  . Supervision of normal first pregnancy 11/27/2012  . Family history of diabetes in pregnancy 11/27/2012  . Constipation 11/27/2012  . Nausea 11/27/2012  . History of facial weakness/Neuro workup/Lesion on MRI w/out change 11/27/2012   Past Medical History  Diagnosis Date  . Migraine   . Gastric ulcer     2000  . UTI (urinary tract infection)   . Infection     UTI, chronic sinus inf  . Eczema   . Ovarian cyst   . Abnormal Pap smear     Past Surgical History  Procedure Laterality Date  . Colposcopy    . Myringotomy with tube placement    . Adenoidectomy      Current outpatient prescriptions:calcium carbonate (TUMS - DOSED IN MG ELEMENTAL CALCIUM) 500 MG chewable tablet, Chew 2 tablets by mouth 2 (two) times daily as needed for heartburn., Disp: , Rfl: ;  diphenhydramine-acetaminophen (TYLENOL PM) 25-500 MG TABS, Take 1 tablet by mouth at bedtime as needed., Disp: , Rfl: ;  hydrocortisone cream 1 %, Apply 1 application topically as needed for itching (skin itching)., Disp: , Rfl:  ibuprofen (ADVIL,MOTRIN) 600 MG tablet, Take 1 tablet (600 mg total) by mouth every 6 (six) hours., Disp: 30 tablet, Rfl: 5;  loratadine (CLARITIN) 10 MG tablet, Take 10 mg by mouth daily., Disp: , Rfl: ;  oxyCODONE-acetaminophen (PERCOCET/ROXICET) 5-325 MG per tablet, Take  1-2 tablets by mouth every 4 (four) hours as needed for severe pain (moderate - severe pain)., Disp: 40 tablet, Rfl: 0 Prenatal Vit-Fe Fumarate-FA (PRENATAL MULTIVITAMIN) TABS tablet, Take 1 tablet by mouth daily at 12 noon., Disp: , Rfl: ;  tetrahydrozoline 0.05 % ophthalmic solution, Place 1 drop into both eyes daily as needed (For dryness.)., Disp: , Rfl: ;  traMADol (ULTRAM) 50 MG tablet, Take 2 tablets (100 mg total) by mouth every 6 (six) hours as needed for moderate pain., Disp: 40 tablet, Rfl: 2 Current facility-administered medications:Tdap (BOOSTRIX) injection 0.5 mL, 0.5 mL, Intramuscular, Once, Antionette Char, MD Allergies  Allergen Reactions  . Augmentin [Amoxicillin-Pot Clavulanate] Diarrhea and Nausea And Vomiting       . Macrobid [Nitrofurantoin Macrocrystal] Hives  . Raspberry Swelling    Throat swelling  . Vicodin [Hydrocodone-Acetaminophen] Hives    Pt can take Acetaminophen    History  Substance Use Topics  . Smoking status: Former Smoker -- 0.50 packs/day    Types: Cigarettes    Quit date: 11/19/2012  . Smokeless tobacco: Never Used     Comment: Aug 2014  . Alcohol Use: No     Comment: Occasionally     Family History  Problem Relation Age of Onset  . Hypertension Mother   . Diabetes Father   . Cancer Paternal Aunt     female  .  Diabetes Paternal Uncle   . Diabetes Paternal Grandmother        Review of Systems Constitutional: negative for weight loss Genitourinary:negative for abnormal menstrual periods and vaginal discharge   Objective:   BP 116/78  Pulse 73  Temp(Src) 97.5 F (36.4 C)  Ht 5\' 4"  (1.626 m)  Wt 166 lb (75.297 kg)  BMI 28.48 kg/m2  Breastfeeding? Yes  PE:  Deferred   Lab Review Urine pregnancy test negative Labs reviewed yes Radiologic studies reviewed no  100% of 10 min visit spent on counseling and coordination of care.   Assessment:    23 y.o., starting Nexplanon, no contraindications.   Plan:   R/O early  pregnancy.  All questions answered. Had protected intercourse last night.  Patient instructed to return in 2 weeks with abstnence and get Nexplanon inserted with negative UPT.  No orders of the defined types were placed in this encounter.   No orders of the defined types were placed in this encounter.

## 2013-08-23 ENCOUNTER — Telehealth: Payer: Self-pay | Admitting: *Deleted

## 2013-08-23 ENCOUNTER — Other Ambulatory Visit: Payer: Self-pay | Admitting: Obstetrics

## 2013-08-23 DIAGNOSIS — N760 Acute vaginitis: Principal | ICD-10-CM

## 2013-08-23 DIAGNOSIS — B9689 Other specified bacterial agents as the cause of diseases classified elsewhere: Secondary | ICD-10-CM

## 2013-08-23 LAB — POCT URINE PREGNANCY: PREG TEST UR: NEGATIVE

## 2013-08-23 MED ORDER — METRONIDAZOLE 0.75 % VA GEL: 1.0000 | Freq: Two times a day (BID) | VAGINAL | Status: DC

## 2013-08-23 NOTE — Telephone Encounter (Signed)
Patient in office 08/19/2013

## 2013-08-23 NOTE — Telephone Encounter (Signed)
Patient called regarding medication for BV that was suppose to be sent to the pharmacy. Dr. Clearance Coots e-scribed her medication for her and told me to advise her that he sent some refills as well. Patient notified that prescription was sent to pharmacy with refills. Patient verbalized understanding and voiced no other concerns.

## 2013-08-23 NOTE — Addendum Note (Signed)
Addended by: Odessa Fleming on: 08/23/2013 04:34 PM   Modules accepted: Orders

## 2013-09-06 ENCOUNTER — Ambulatory Visit (INDEPENDENT_AMBULATORY_CARE_PROVIDER_SITE_OTHER): Payer: BC Managed Care – PPO | Admitting: Obstetrics

## 2013-09-06 ENCOUNTER — Encounter: Payer: Self-pay | Admitting: Obstetrics

## 2013-09-06 VITALS — BP 119/84 | HR 73 | Temp 98.2°F | Ht 65.0 in | Wt 169.0 lb

## 2013-09-06 DIAGNOSIS — Z3202 Encounter for pregnancy test, result negative: Secondary | ICD-10-CM

## 2013-09-06 DIAGNOSIS — Z30017 Encounter for initial prescription of implantable subdermal contraceptive: Secondary | ICD-10-CM

## 2013-09-06 LAB — POCT URINE PREGNANCY: PREG TEST UR: NEGATIVE

## 2013-09-06 NOTE — Addendum Note (Signed)
Addended by: Odessa FlemingBOHNE, KRISTINA M on: 09/06/2013 05:28 PM   Modules accepted: Orders

## 2013-09-06 NOTE — Progress Notes (Signed)
Nexplanon Procedure Note   PRE-OP DIAGNOSIS: desired long-term, reversible contraception  POST-OP DIAGNOSIS: Same  PROCEDURE: Nexplanon  placement Performing Provider: Dory HornAmy Wren CNM   Patient education prior to procedure, explained risk, benefits of Nexplanon, reviewed alternative options. Patient reported understanding. Gave consent to continue with procedure.   PROCEDURE:  Pregnancy Text :  Negative Site (check):      left arm         Sterile Preparation:   Betadinex3 Lot # [id number] / [id number] Expiration Date 11 / 2017  Insertion site was selected 8 - 10 cm from medial epicondyle and marked along with guiding site using sterile marker. Procedure area was prepped and draped in a sterile fashion. 1% Lidocaine 1.5 ml given prior to procedure. Nexplanon  was inserted subcutaneously.Needle was removed from the insertion site. Nexplanon capsule was palpated by provider and patient to assure satisfactory placement. Dressing applied.  Followup: The patient tolerated the procedure well without complications.  Standard post-procedure care is explained and return precautions are given.  Amy Wilson SingerWren CNM

## 2013-09-21 ENCOUNTER — Encounter: Payer: Self-pay | Admitting: Obstetrics

## 2013-09-21 ENCOUNTER — Ambulatory Visit (INDEPENDENT_AMBULATORY_CARE_PROVIDER_SITE_OTHER): Payer: BC Managed Care – PPO | Admitting: Obstetrics

## 2013-09-21 VITALS — BP 113/80 | HR 85 | Temp 98.1°F | Ht 65.0 in | Wt 171.0 lb

## 2013-09-21 DIAGNOSIS — Z3046 Encounter for surveillance of implantable subdermal contraceptive: Secondary | ICD-10-CM

## 2013-09-21 NOTE — Progress Notes (Signed)
Subjective:    Valerie [name] is a 23 y.o. female who presents for 2 week F/U visit after Nexplanon Insertion. The patient has no complaints today. The patient is sexually active. Pertinent past medical history: none.  The information documented in the HPI was reviewed and verified.  Menstrual History: OB History   Grav Para Term Preterm Abortions TAB SAB Ect Mult Living   3 1 1  2  2   1        No LMP recorded.   Patient Active Problem List   Diagnosis Date Noted  . Normal delivery 07/07/2013  . Active labor 07/06/2013  . Backache 07/05/2013  . BV (bacterial vaginosis) 12/24/2012  . Former smoker, stopped smoking in distant past 11/27/2012  . Supervision of normal first pregnancy 11/27/2012  . Family history of diabetes in pregnancy 11/27/2012  . Constipation 11/27/2012  . Nausea 11/27/2012  . History of facial weakness/Neuro workup/Lesion on MRI w/out change 11/27/2012   Past Medical History  Diagnosis Date  . Migraine   . Gastric ulcer     2000  . UTI (urinary tract infection)   . Infection     UTI, chronic sinus inf  . Eczema   . Ovarian cyst   . Abnormal Pap smear     Past Surgical History  Procedure Laterality Date  . Colposcopy    . Myringotomy with tube placement    . Adenoidectomy      Current outpatient prescriptions:hydrocortisone cream 1 %, Apply 1 application topically as needed for itching (skin itching)., Disp: , Rfl: ;  ibuprofen (ADVIL,MOTRIN) 600 MG tablet, Take 1 tablet (600 mg total) by mouth every 6 (six) hours., Disp: 30 tablet, Rfl: 5 oxyCODONE-acetaminophen (PERCOCET/ROXICET) 5-325 MG per tablet, Take 1-2 tablets by mouth every 4 (four) hours as needed for severe pain (moderate - severe pain)., Disp: 40 tablet, Rfl: 0;  Prenatal Vit-Fe Fumarate-FA (PRENATAL MULTIVITAMIN) TABS tablet, Take 1 tablet by mouth daily at 12 noon., Disp: , Rfl: ;  tetrahydrozoline 0.05 % ophthalmic solution, Place 1 drop into both eyes daily as needed (For dryness.).,  Disp: , Rfl:  Current facility-administered medications:Tdap (BOOSTRIX) injection 0.5 mL, 0.5 mL, Intramuscular, Once, Antionette CharLisa Jackson-Moore, MD Allergies  Allergen Reactions  . Augmentin [Amoxicillin-Pot Clavulanate] Diarrhea and Nausea And Vomiting       . Macrobid [Nitrofurantoin Macrocrystal] Hives  . Raspberry Swelling    Throat swelling  . Vicodin [Hydrocodone-Acetaminophen] Hives    Pt can take Acetaminophen    History  Substance Use Topics  . Smoking status: Former Smoker -- 0.50 packs/day    Types: Cigarettes    Quit date: 11/19/2012  . Smokeless tobacco: Never Used     Comment: Aug 2014  . Alcohol Use: Yes     Comment: Occasionally     Family History  Problem Relation Age of Onset  . Hypertension Mother   . Diabetes Father   . Cancer Paternal Aunt     female  . Diabetes Paternal Uncle   . Diabetes Paternal Grandmother        Review of Systems Constitutional: negative for weight loss Genitourinary:negative for abnormal menstrual periods and vaginal discharge   Objective:   BP 113/80  Pulse 85  Temp(Src) 98.1 F (36.7 C)  Ht 5\' 5"  (1.651 m)  Wt 171 lb (77.565 kg)  BMI 28.46 kg/m2  Breastfeeding? Yes   PE:        Left upper arm:  Insertion site clean, healed and nontender.  Lab Review Urine pregnancy test Labs reviewed yes Radiologic studies reviewed no    Assessment:    23 y.o., starting Nexplanon, no contraindications.  Doing well.    Plan:     All questions answered. Follow up in 3 months.  No orders of the defined types were placed in this encounter.   No orders of the defined types were placed in this encounter.

## 2013-11-11 ENCOUNTER — Encounter: Payer: Self-pay | Admitting: Obstetrics

## 2013-11-11 ENCOUNTER — Ambulatory Visit (INDEPENDENT_AMBULATORY_CARE_PROVIDER_SITE_OTHER): Payer: BC Managed Care – PPO | Admitting: Obstetrics

## 2013-11-11 DIAGNOSIS — B9689 Other specified bacterial agents as the cause of diseases classified elsewhere: Secondary | ICD-10-CM

## 2013-11-11 DIAGNOSIS — N898 Other specified noninflammatory disorders of vagina: Secondary | ICD-10-CM

## 2013-11-11 DIAGNOSIS — A499 Bacterial infection, unspecified: Secondary | ICD-10-CM

## 2013-11-11 DIAGNOSIS — N760 Acute vaginitis: Secondary | ICD-10-CM

## 2013-11-11 DIAGNOSIS — Z3046 Encounter for surveillance of implantable subdermal contraceptive: Secondary | ICD-10-CM

## 2013-11-11 MED ORDER — CLINDAMYCIN PHOSPHATE (1 DOSE) 2 % VA CREA: TOPICAL_CREAM | VAGINAL | Status: DC

## 2013-11-11 NOTE — Addendum Note (Signed)
Addended by: Elby Beck F on: 11/11/2013 05:37 PM   Modules accepted: Orders

## 2013-11-11 NOTE — Progress Notes (Signed)
Patient ID: Valerie [name], female   DOB: [birth year], 23 y.o.   MRN: [id number]  Chief Complaint  Patient presents with  . Menstrual Problem    tissue like discharge    HPI [name] is a 23 y.o. female.  Malodorous, thick discharge.  HPI  Past Medical History  Diagnosis Date  . Migraine   . Gastric ulcer     2000  . UTI (urinary tract infection)   . Infection     UTI, chronic sinus inf  . Eczema   . Ovarian cyst   . Abnormal Pap smear     Past Surgical History  Procedure Laterality Date  . Colposcopy    . Myringotomy with tube placement    . Adenoidectomy      Family History  Problem Relation Age of Onset  . Hypertension Mother   . Diabetes Father   . Cancer Paternal Aunt     female  . Diabetes Paternal Uncle   . Diabetes Paternal Grandmother     Social History History  Substance Use Topics  . Smoking status: Former Smoker -- 0.50 packs/day    Types: Cigarettes    Quit date: 11/19/2012  . Smokeless tobacco: Never Used     Comment: Aug 2014  . Alcohol Use: Yes     Comment: Occasionally     Allergies  Allergen Reactions  . Augmentin [Amoxicillin-Pot Clavulanate] Diarrhea and Nausea And Vomiting       . Macrobid [Nitrofurantoin Macrocrystal] Hives  . Raspberry Swelling    Throat swelling  . Vicodin [Hydrocodone-Acetaminophen] Hives    Pt can take Acetaminophen    Current Outpatient Prescriptions  Medication Sig Dispense Refill  . hydrocortisone cream 1 % Apply 1 application topically as needed for itching (skin itching).      Marland Kitchen ibuprofen (ADVIL,MOTRIN) 600 MG tablet Take 1 tablet (600 mg total) by mouth every 6 (six) hours.  30 tablet  5  . Prenatal Vit-Fe Fumarate-FA (PRENATAL MULTIVITAMIN) TABS tablet Take 1 tablet by mouth daily at 12 noon.      Marland Kitchen tetrahydrozoline 0.05 % ophthalmic solution Place 1 drop into both eyes daily as needed (For dryness.).      Marland Kitchen Clindamycin Phosphate, 1 Dose, (CLINDESSE) vaginal cream One applicatorful  intravaginally at bedtime, once.  5.8 g  0  . oxyCODONE-acetaminophen (PERCOCET/ROXICET) 5-325 MG per tablet Take 1-2 tablets by mouth every 4 (four) hours as needed for severe pain (moderate - severe pain).  40 tablet  0   Current Facility-Administered Medications  Medication Dose Route Frequency Provider Last Rate Last Dose  . Tdap (BOOSTRIX) injection 0.5 mL  0.5 mL Intramuscular Once Antionette Char, MD        Review of Systems Review of Systems Constitutional: negative for fatigue and weight loss Respiratory: negative for cough and wheezing Cardiovascular: negative for chest pain, fatigue and palpitations Gastrointestinal: negative for abdominal pain and change in bowel habits Genitourinary:negative Integument/breast: negative for nipple discharge Musculoskeletal:negative for myalgias Neurological: negative for gait problems and tremors Behavioral/Psych: negative for abusive relationship, depression Endocrine: negative for temperature intolerance     currently breastfeeding.  Physical Exam Physical Exam General:   alert  Skin:   no rash or abnormalities  Lungs:   clear to auscultation bilaterally  Heart:   regular rate and rhythm, S1, S2 normal, no murmur, click, rub or gallop  Breasts:   normal without suspicious masses, skin or nipple changes or axillary nodes  Abdomen:  normal findings: no  organomegaly, soft, non-tender and no hernia  Pelvis:  External genitalia: normal general appearance Urinary system: urethral meatus normal and bladder without fullness, nontender Vaginal: normal without tenderness, induration or masses.  Positive for dark grey malodorous vaginal discharge Cervix: normal appearance Adnexa: normal bimanual exam Uterus: anteverted and non-tender, normal size      Data Reviewed Labs  Assessment    BV  Skin tags    Plan   Clindesse vaginal cream dispensed. F/U in 2 weeks  Orders Placed This Encounter  Procedures  . POCT urinalysis  dipstick   Meds ordered this encounter  Medications  . Clindamycin Phosphate, 1 Dose, (CLINDESSE) vaginal cream    Sig: One applicatorful intravaginally at bedtime, once.    Dispense:  5.8 g    Refill:  0       Tenicia Gural A 11/11/2013, 4:38 PM

## 2013-11-12 LAB — GC/CHLAMYDIA PROBE AMP
CT Probe RNA: NEGATIVE
GC Probe RNA: NEGATIVE

## 2013-11-12 LAB — WET PREP BY MOLECULAR PROBE
CANDIDA SPECIES: NEGATIVE
Gardnerella vaginalis: NEGATIVE
Trichomonas vaginosis: NEGATIVE

## 2013-11-12 LAB — POCT URINE PREGNANCY: Preg Test, Ur: NEGATIVE

## 2013-11-12 NOTE — Addendum Note (Signed)
Addended by: Odessa Fleming on: 11/12/2013 09:37 AM   Modules accepted: Orders

## 2013-12-07 ENCOUNTER — Other Ambulatory Visit: Payer: Self-pay | Admitting: Obstetrics

## 2013-12-14 ENCOUNTER — Encounter: Payer: Self-pay | Admitting: Obstetrics

## 2013-12-14 ENCOUNTER — Other Ambulatory Visit: Payer: Self-pay | Admitting: Obstetrics

## 2013-12-14 ENCOUNTER — Ambulatory Visit (INDEPENDENT_AMBULATORY_CARE_PROVIDER_SITE_OTHER): Payer: BC Managed Care – PPO | Admitting: Obstetrics

## 2013-12-14 VITALS — BP 112/85 | HR 90 | Temp 98.8°F | Ht 64.0 in | Wt 178.0 lb

## 2013-12-14 DIAGNOSIS — K644 Residual hemorrhoidal skin tags: Secondary | ICD-10-CM

## 2013-12-14 DIAGNOSIS — F3289 Other specified depressive episodes: Secondary | ICD-10-CM | POA: Insufficient documentation

## 2013-12-14 DIAGNOSIS — Z113 Encounter for screening for infections with a predominantly sexual mode of transmission: Secondary | ICD-10-CM

## 2013-12-14 DIAGNOSIS — F329 Major depressive disorder, single episode, unspecified: Secondary | ICD-10-CM

## 2013-12-14 MED ORDER — SERTRALINE HCL 25 MG PO TABS: 25.0000 mg | ORAL_TABLET | Freq: Every day | ORAL | Status: DC

## 2013-12-15 LAB — GC/CHLAMYDIA PROBE AMP
CT PROBE, AMP APTIMA: NEGATIVE
GC PROBE AMP APTIMA: NEGATIVE

## 2013-12-15 LAB — HEPATITIS C ANTIBODY: HCV Ab: NEGATIVE

## 2013-12-15 LAB — HEPATITIS B SURFACE ANTIGEN: HEP B S AG: NEGATIVE

## 2013-12-15 LAB — HIV ANTIBODY (ROUTINE TESTING W REFLEX): HIV: NONREACTIVE

## 2013-12-15 LAB — RPR

## 2013-12-23 ENCOUNTER — Ambulatory Visit: Payer: BC Managed Care – PPO | Admitting: Obstetrics

## 2013-12-24 NOTE — Progress Notes (Signed)
Op Note:      Pre Op Dx:  Skin Tag - Perianal      Post Op Dx:  Same      Procedure:  Resection of Perianal Skin Tag      Surgeon:  Coral Ceoharles Harper MD      Anesthesia:  Local 1% Xylocaine      EBL:  Negligible      Complications:  None      Specimen:  1-2 cm Skin Tag      Disposition of Specimen:  Pathology       Coral Ceoharles Harper MD

## 2014-01-17 ENCOUNTER — Encounter: Payer: Self-pay | Admitting: Obstetrics

## 2014-02-23 ENCOUNTER — Ambulatory Visit: Payer: BC Managed Care – PPO | Admitting: Obstetrics

## 2014-03-14 ENCOUNTER — Encounter: Payer: Self-pay | Admitting: *Deleted

## 2014-03-15 ENCOUNTER — Encounter: Payer: Self-pay | Admitting: Obstetrics & Gynecology

## 2014-07-09 ENCOUNTER — Emergency Department (HOSPITAL_COMMUNITY)
Admission: EM | Admit: 2014-07-09 | Discharge: 2014-07-09 | Disposition: A | Discharge status: Home / Self Care | Payer: Self-pay | Attending: Emergency Medicine | Admitting: Emergency Medicine

## 2014-07-09 ENCOUNTER — Encounter (HOSPITAL_COMMUNITY): Payer: Self-pay | Admitting: Emergency Medicine

## 2014-07-09 ENCOUNTER — Emergency Department (HOSPITAL_COMMUNITY): Payer: Self-pay

## 2014-07-09 DIAGNOSIS — Y9389 Activity, other specified: Secondary | ICD-10-CM | POA: Insufficient documentation

## 2014-07-09 DIAGNOSIS — S92902A Unspecified fracture of left foot, initial encounter for closed fracture: Secondary | ICD-10-CM | POA: Insufficient documentation

## 2014-07-09 DIAGNOSIS — Z79899 Other long term (current) drug therapy: Secondary | ICD-10-CM | POA: Insufficient documentation

## 2014-07-09 DIAGNOSIS — Y998 Other external cause status: Secondary | ICD-10-CM | POA: Insufficient documentation

## 2014-07-09 DIAGNOSIS — X58XXXA Exposure to other specified factors, initial encounter: Secondary | ICD-10-CM | POA: Insufficient documentation

## 2014-07-09 DIAGNOSIS — Z8719 Personal history of other diseases of the digestive system: Secondary | ICD-10-CM | POA: Insufficient documentation

## 2014-07-09 DIAGNOSIS — Z87440 Personal history of urinary (tract) infections: Secondary | ICD-10-CM | POA: Insufficient documentation

## 2014-07-09 DIAGNOSIS — Z8742 Personal history of other diseases of the female genital tract: Secondary | ICD-10-CM | POA: Insufficient documentation

## 2014-07-09 DIAGNOSIS — Y9289 Other specified places as the place of occurrence of the external cause: Secondary | ICD-10-CM | POA: Insufficient documentation

## 2014-07-09 DIAGNOSIS — Z87891 Personal history of nicotine dependence: Secondary | ICD-10-CM | POA: Insufficient documentation

## 2014-07-09 DIAGNOSIS — Z872 Personal history of diseases of the skin and subcutaneous tissue: Secondary | ICD-10-CM | POA: Insufficient documentation

## 2014-07-09 DIAGNOSIS — Z8679 Personal history of other diseases of the circulatory system: Secondary | ICD-10-CM | POA: Insufficient documentation

## 2014-07-09 MED ORDER — IBUPROFEN 800 MG PO TABS: 800.0000 mg | ORAL_TABLET | Freq: Three times a day (TID) | ORAL | Status: DC

## 2014-07-09 MED ORDER — OXYCODONE-ACETAMINOPHEN 5-325 MG PO TABS: 2.0000 | ORAL_TABLET | ORAL | Status: DC | PRN

## 2014-07-09 MED ORDER — OXYCODONE-ACETAMINOPHEN 5-325 MG PO TABS
2.0000 | ORAL_TABLET | Freq: Once | ORAL | Status: AC
  Administered 2014-07-09: 2 via ORAL
  Filled 2014-07-09: qty 2

## 2014-07-09 NOTE — ED Provider Notes (Signed)
CSN: [id number]     Arrival date & time 07/09/14  1835 History  This chart was scribed for non-physician practitioner, Langston MaskerKaren Ikechukwu Cerny, PA-C,working with Elwin MochaBlair Walden, MD, by Karle PlumberJennifer Tensley, ED Scribe. This patient was seen in room WTR5/WTR5 and the patient's care was started at 7:43 PM.  Chief Complaint  Patient presents with  . Ankle Pain   The history is provided by the patient and medical records. No language interpreter was used.    HPI Comments:  Valerie [name] is a 24 y.o. female who presents to the Emergency Department complaining of severe left ankle pain that occurred approximately 1.5 hours ago when she rolled her ankle off a curb. She states she has not been able to bear weight since the incident. She has not done anything to treat the pain. Touching the area makes the pain worse. She denies alleviating factors. She denies numbness, tingling or weakness of the left ankle or foot, wounds, bruising or falling. PMHx of migraine, gastric ulcer, eczema and ovarian cysts.  Past Medical History  Diagnosis Date  . Migraine   . Gastric ulcer     2000  . UTI (urinary tract infection)   . Infection     UTI, chronic sinus inf  . Eczema   . Ovarian cyst   . Abnormal Pap smear    Past Surgical History  Procedure Laterality Date  . Colposcopy    . Myringotomy with tube placement    . Adenoidectomy     Family History  Problem Relation Age of Onset  . Hypertension Mother   . Diabetes Father   . Cancer Paternal Aunt     female  . Diabetes Paternal Uncle   . Diabetes Paternal Grandmother    History  Substance Use Topics  . Smoking status: Former Smoker -- 0.50 packs/day    Types: Cigarettes    Quit date: 11/19/2012  . Smokeless tobacco: Never Used     Comment: Aug 2014  . Alcohol Use: Yes     Comment: Occasionally    OB History    Gravida Para Term Preterm AB TAB SAB Ectopic Multiple Living   3 1 1  2  2   1      Review of Systems  Musculoskeletal: Positive for  arthralgias.  Skin: Negative for color change and wound.  Neurological: Negative for weakness and numbness.  All other systems reviewed and are negative.   Allergies  Augmentin; Macrobid; Raspberry; and Vicodin  Home Medications   Prior to Admission medications   Medication Sig Start Date End Date Taking? Authorizing Provider  acetaminophen (TYLENOL) 500 MG tablet Take 1,000 mg by mouth every 6 (six) hours as needed for headache (headache).   Yes Historical Provider, MD  escitalopram (LEXAPRO) 10 MG tablet Take 10 mg by mouth at bedtime.  06/24/14  Yes Historical Provider, MD  sertraline (ZOLOFT) 25 MG tablet Take 1 tablet (25 mg total) by mouth daily. Patient not taking: Reported on 07/09/2014 12/14/13   [name] A Harper, MD  tetrahydrozoline 0.05 % ophthalmic solution Place 1 drop into both eyes daily as needed (For dryness.).    Historical Provider, MD   Temp(Src)  (Oral) Physical Exam  Constitutional: She is oriented to person, place, and time. She appears well-developed and well-nourished.  HENT:  Head: Normocephalic and atraumatic.  Eyes: EOM are normal.  Neck: Normal range of motion.  Cardiovascular: Normal rate.   Pulmonary/Chest: Effort normal.  Musculoskeletal: Normal range of motion. She exhibits tenderness.  Swollen, tender midfoot and lateral left malleolus.  Neurological: She is alert and oriented to person, place, and time.  Neurovascularly and neurosensory intact.  Skin: Skin is warm and dry.  Psychiatric: She has a normal mood and affect. Her behavior is normal.  Nursing note and vitals reviewed.   ED Course  Procedures (including critical care time) DIAGNOSTIC STUDIES:    COORDINATION OF CARE: 7:48 PM- Will order splint, crutches and orthopedic referral. Will order pain medication prior to discharge. Pt verbalizes understanding and agrees to plan.  Medications - No data to display  Labs Review Labs Reviewed - No data to display  Imaging Review Dg Ankle  Complete Left  07/09/2014   CLINICAL DATA:  LEFT ankle pain. Injury today wall taken the trash out. Twisted ankle. Initial encounter. Anterior ankle pain.  EXAM: LEFT ANKLE COMPLETE - 3+ VIEW  COMPARISON:  None.  FINDINGS: Anatomic alignment of the ankle. The mortise is congruent. The talar dome is intact. On the frontal view, there is a tiny fleck of bone adjacent to the lateral aspect of the midfoot which could represent a tiny avulsion fracture fragment. Accessory ossicle could also produce this appearance. Correlation for tenderness in this region recommended.  IMPRESSION: Tiny fleck of bone over the lateral midfoot could represent a small avulsion fracture or accessory ossicle. Correlation with physical exam recommended.   Electronically Signed   By: Andreas Newport M.D.   On: 07/09/2014 19:33     EKG Interpretation None      MDM   Final diagnoses:  Foot fracture, left, closed, initial encounter      I personally performed the services described in this documentation, which was scribed in my presence. The recorded information has been reviewed and is accurate.  Cam walker Crutches Ibuprofen Percocet Schedule to see Dr. Ave Filter for evaluation  I personally performed the services in this documentation, which was scribed in my presence.  The recorded information has been reviewed and considered.   Barnet Pall.  Lonia Skinner Seligman, PA-C 07/09/14 1956  Elwin Mocha, MD 07/09/14 2229

## 2014-07-09 NOTE — Discharge Instructions (Signed)
Cuneiform Fracture, Simple Cuneiform Fracture (simple) is a break (fracture) of one of your cuneiform bones. This is one of the bones located in the middle of your foot. When fractures are small and not out of place, they may be treated conservatively. This means that only a cast is needed for treatment. At first, no walking may be allowed, but following a period of time, your caregiver may allow you to have a walking cast. DIAGNOSIS  The diagnosis of a fractured cuneiform is made by X-ray. X-rays may be required before and after the injury is put into a splint or cast. HOME CARE INSTRUCTIONS   Only take over-the-counter or prescription medicines for pain, discomfort or fever as directed by your caregiver.  If you have a splint held on with an elastic wrap and your foot or toes become numb or cold and blue, loosen the wrap and reapply more loosely. See your caregiver if there is no relief.  Use your injured foot as directed. Do not drive a vehicle until your caregiver specifically tells you it is safe to do so.  WARNING: See your caregiver as directed. It is important to keep all follow-up appointments in order to provide the best opportunity for healing and to avoid disability and chronic pain. SEEK IMMEDIATE MEDICAL CARE IF:   There is swelling or increasing pain in foot.  You begin to lose feeling in your foot or toes, or develop swelling of the foot or toes.  The foot or toes on the injured side are blue or cold.  You develop pain, which is not controlled by the medications. MAKE SURE YOU:   Understand these instructions.  Will watch your condition.  Will get help right away if you are not doing well or get worse. Document Released: 11/24/2001 Document Revised: 05/27/2011 Document Reviewed: 10/08/2007 Exit[employer] Patient Information 2015 [employer]. This information is not intended to replace advice given to you by your health care provider. Make sure you discuss any questions you  have with your health care provider.   

## 2014-07-09 NOTE — ED Notes (Signed)
Pt states that she was taking the trash out about 30 mins ago when she rolled her ankle off a curb (lt).

## 2014-12-13 IMAGING — US US OB COMP LESS 14 WK
1 series · 14 of 28 positions shown · non-contrast
Comparison: None

CLINICAL DATA: 21-year-old pregnant female with bleeding. Estimated
gestational age of 7 weeks 0 days by LMP.

OBSTETRIC <14 WK US AND TRANSVAGINAL OB US
TECHNIQUE: Both transabdominal and transvaginal ultrasound
examinations were performed for complete evaluation of the
gestation as well as the maternal uterus, adnexal regions, and
pelvic cul-de-sac.  Transvaginal technique was performed to assess
early pregnancy.

[Series 1: us ob comp less 14 wks · 14 of 40 slices shown]
[im 2/40]
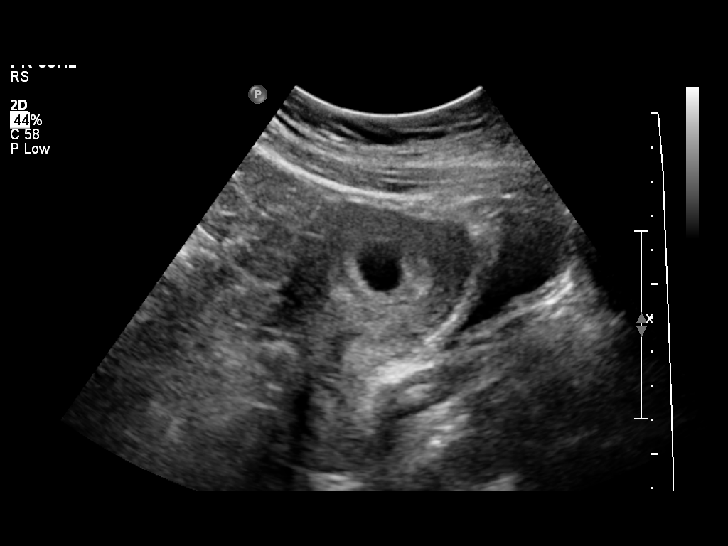
[im 5/40]
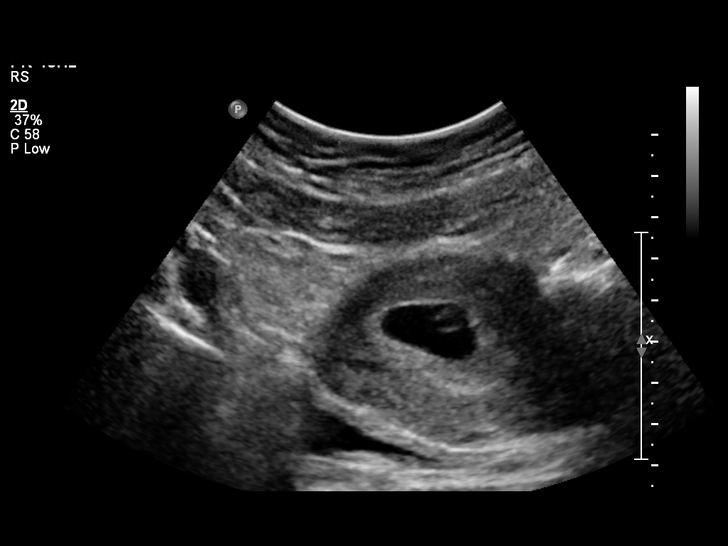
[im 8/40]
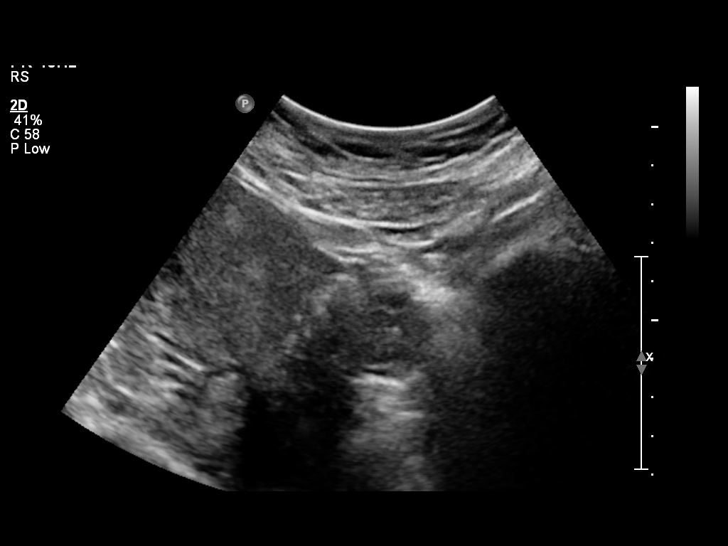
[im 11/40]
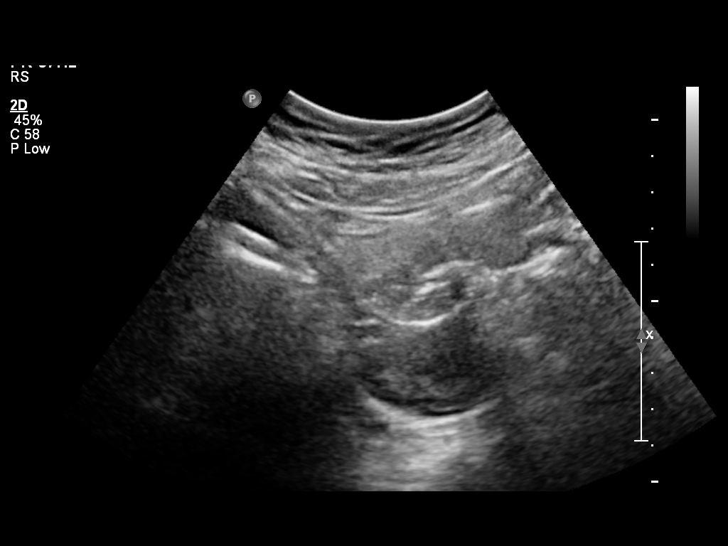
[im 14/40]
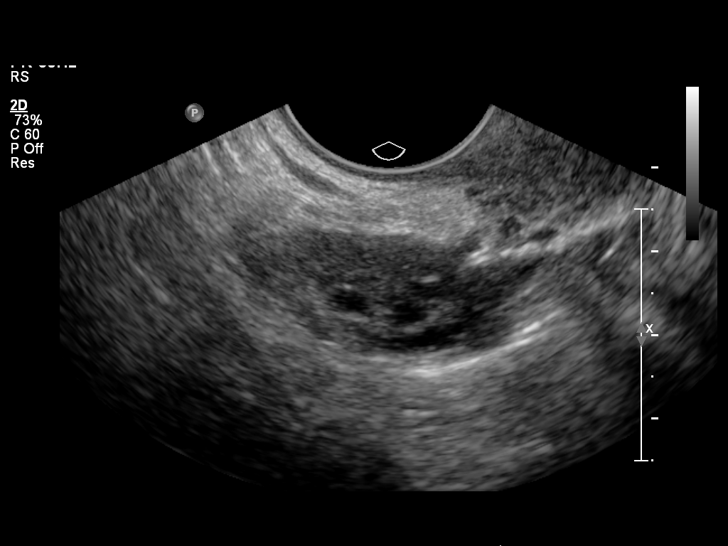
[im 16/40]
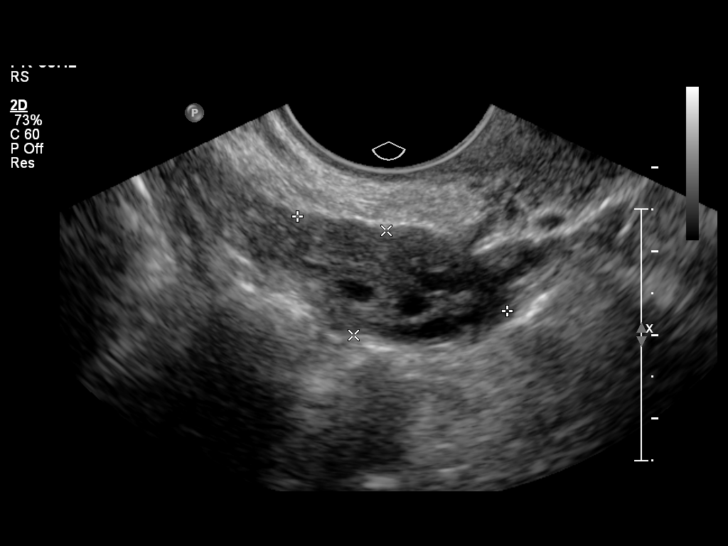
[im 19/40]
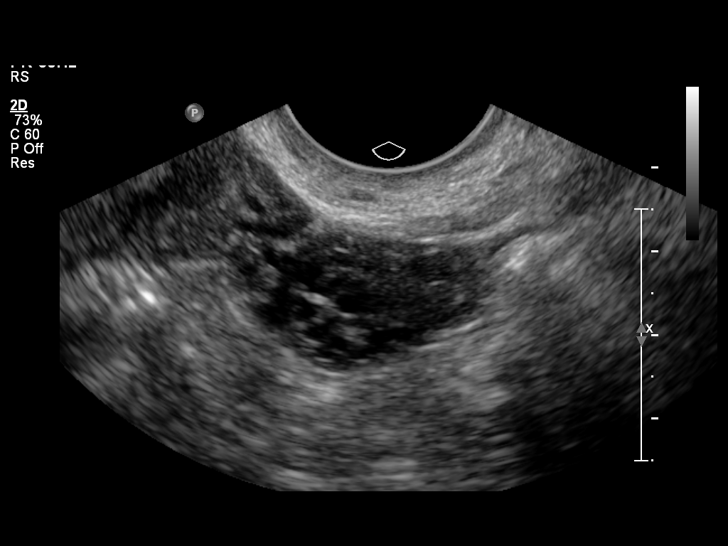
[im 22/40]
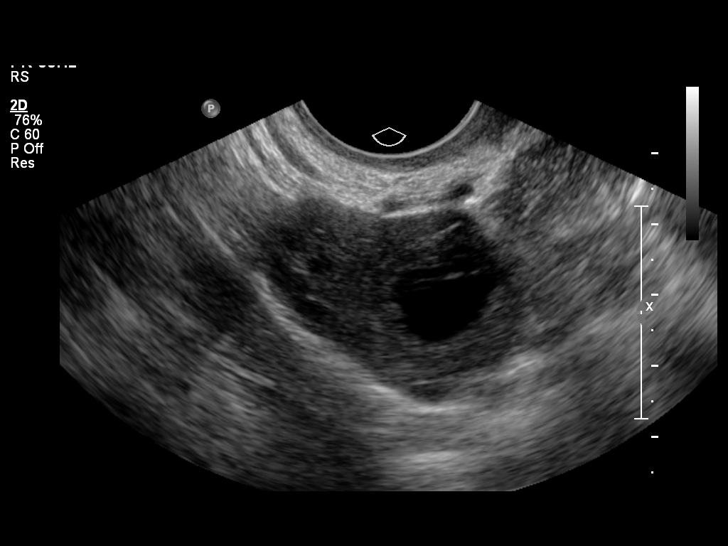
[im 25/40]
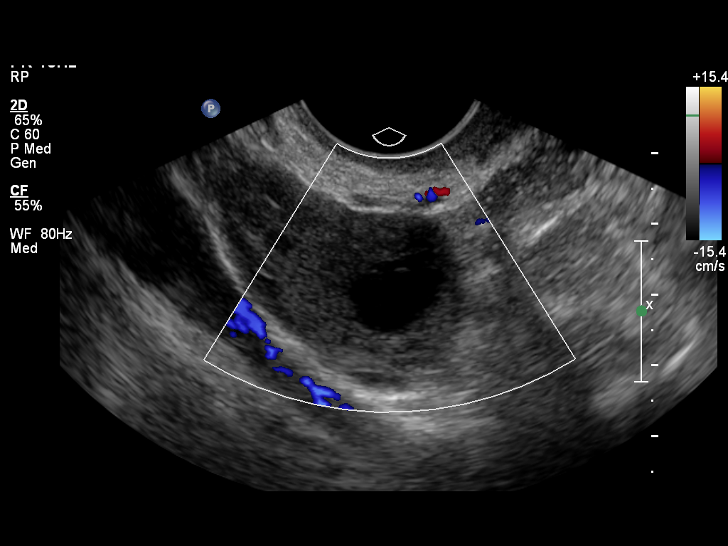
[im 28/40]
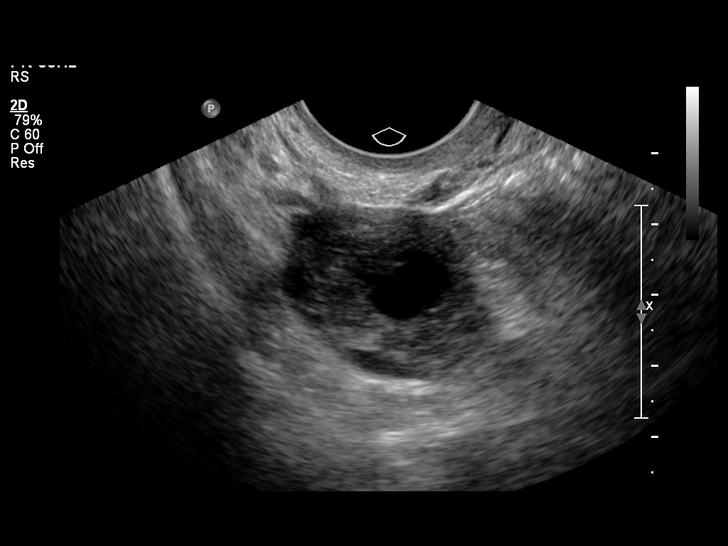
[im 31/40]
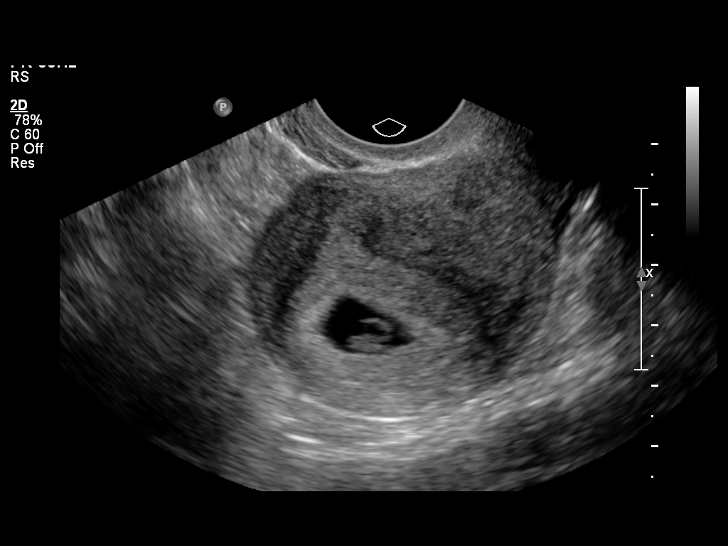
[im 34/40]
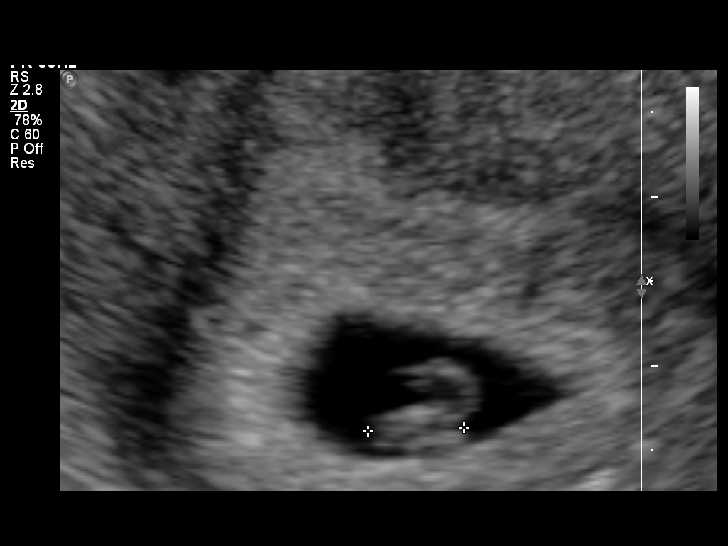
[im 37/40]
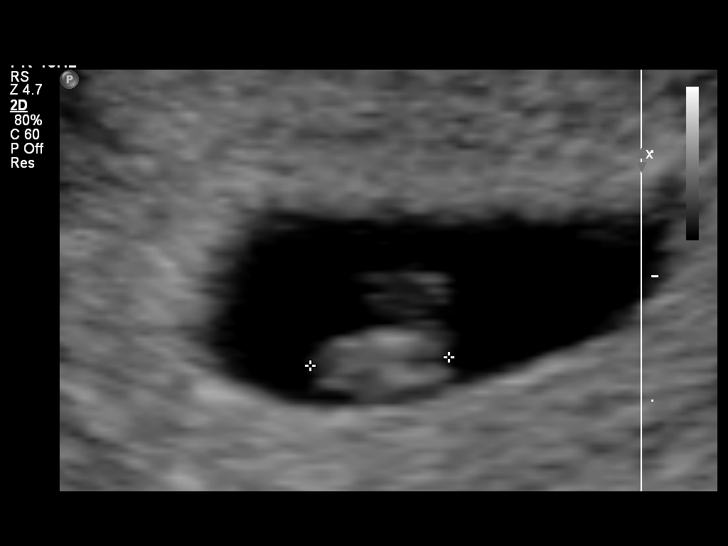
[im 40/40]
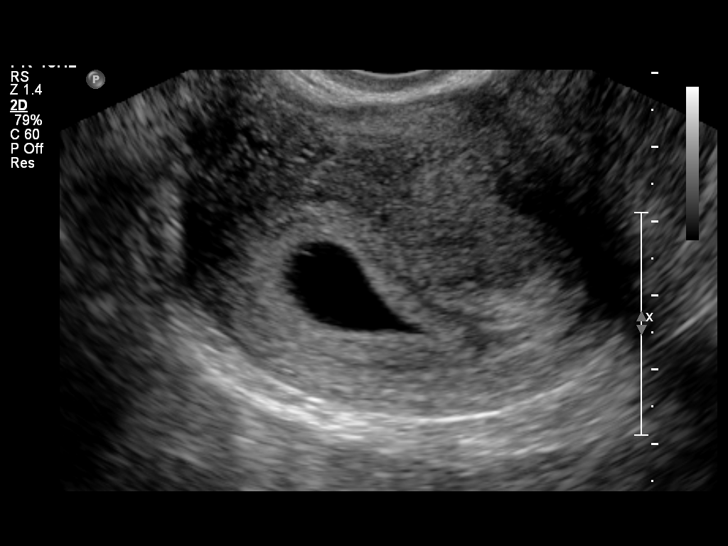

[14 of 28 positions shown; findings below may reference images not displayed]

Intrauterine gestational sac:  Visualized/normal in shape.
Yolk sac: Present
Embryo: Present
Cardiac Activity: Present
Heart Rate: 111 bpm

CRL: 5.9  mm  6 w  3 d        US EDC: 07/09/2013

Maternal uterus/adnexae:
There is no subchorionic hemorrhage.
The ovaries bilaterally are unremarkable.
There is no evidence of free fluid or adnexal mass.
IMPRESSION: Single living intrauterine gestation with estimated gestational age
of 6 weeks 3 days by this ultrasound.

No evidence of subchorionic hemorrhage.

## 2015-04-04 ENCOUNTER — Encounter: Payer: Self-pay | Admitting: Obstetrics

## 2015-04-04 ENCOUNTER — Ambulatory Visit (INDEPENDENT_AMBULATORY_CARE_PROVIDER_SITE_OTHER): Payer: BLUE CROSS/BLUE SHIELD | Admitting: Obstetrics

## 2015-04-04 VITALS — BP 123/74 | HR 91 | Ht 65.0 in | Wt 198.0 lb

## 2015-04-04 DIAGNOSIS — N760 Acute vaginitis: Secondary | ICD-10-CM | POA: Diagnosis not present

## 2015-04-04 DIAGNOSIS — A499 Bacterial infection, unspecified: Secondary | ICD-10-CM

## 2015-04-04 DIAGNOSIS — Z3009 Encounter for other general counseling and advice on contraception: Secondary | ICD-10-CM | POA: Diagnosis not present

## 2015-04-04 DIAGNOSIS — Z01419 Encounter for gynecological examination (general) (routine) without abnormal findings: Secondary | ICD-10-CM

## 2015-04-04 DIAGNOSIS — B9689 Other specified bacterial agents as the cause of diseases classified elsewhere: Secondary | ICD-10-CM

## 2015-04-04 DIAGNOSIS — Z113 Encounter for screening for infections with a predominantly sexual mode of transmission: Secondary | ICD-10-CM | POA: Diagnosis not present

## 2015-04-04 MED ORDER — TINIDAZOLE 500 MG PO TABS: 1000.0000 mg | ORAL_TABLET | Freq: Every day | ORAL | Status: DC

## 2015-04-04 NOTE — Addendum Note (Signed)
Addended by: Marya Landry D on: 04/04/2015 02:02 PM   Modules accepted: Orders

## 2015-04-04 NOTE — Addendum Note (Signed)
Addended by: Marya Landry D on: 04/04/2015 01:35 PM   Modules accepted: Orders

## 2015-04-04 NOTE — Progress Notes (Signed)
Subjective:        Valerie [name] is a 25 y.o. female here for a routine exam.  Current complaints: Has undesirable weight gain since Nexplanon insertion, and can't lose it.  Also just doesn't feel well with hot flushes and mood swings.  Wants Nexplanon removed and wants to start NuvaRing.  Has malodorous vaginal discharge that comes and goes, recently treated with MetroGel for presumptive BV.  Personal health questionnaire:  Is patient Ashkenazi Jewish, have a family history of breast and/or ovarian cancer: no Is there a family history of uterine cancer diagnosed at age < 53, gastrointestinal cancer, urinary tract cancer, family member who is a Personnel officer syndrome-associated carrier: no Is the patient overweight and hypertensive, family history of diabetes, personal history of gestational diabetes, preeclampsia or PCOS: no Is patient over 71, have PCOS,  family history of premature CHD under age 35, diabetes, smoke, have hypertension or peripheral artery disease:  no At any time, has a partner hit, kicked or otherwise hurt or frightened you?: no Over the past 2 weeks, have you felt down, depressed or hopeless?: no Over the past 2 weeks, have you felt little interest or pleasure in doing things?:no   Gynecologic History No LMP recorded. Patient has had an implant. Contraception: Nexplanon Last Pap: 2015. Results were: normal Last mammogram: n/a. Results were: n/a  Obstetric History OB History  Gravida Para Term Preterm AB SAB TAB Ectopic Multiple Living  # Outcome Date GA Lbr Len/2nd Weight Sex Delivery Anes PTL Lv  3 Term 07/07/13 [redacted]w[redacted]d 10:05 / 01:11 6 lb 4.5 oz (2.85 kg) Genella Mech EPI  Y  2 SAB 02/16/12 [redacted]w[redacted]d         1 SAB 12/17/03 [redacted]w[redacted]d             Past Medical History  Diagnosis Date  . Migraine   . Gastric ulcer     2000  . UTI (urinary tract infection)   . Infection     UTI, chronic sinus inf  . Eczema   . Ovarian cyst   . Abnormal Pap smear      Past Surgical History  Procedure Laterality Date  . Colposcopy    . Myringotomy with tube placement    . Adenoidectomy       Current outpatient prescriptions:  .  escitalopram (LEXAPRO) 10 MG tablet, Take 10 mg by mouth at bedtime. , Disp: , Rfl: 0 .  acetaminophen (TYLENOL) 500 MG tablet, Take 1,000 mg by mouth every 6 (six) hours as needed for headache (headache). Reported on 04/04/2015, Disp: , Rfl:  .  ibuprofen (ADVIL,MOTRIN) 800 MG tablet, Take 1 tablet (800 mg total) by mouth 3 (three) times daily. (Patient not taking: Reported on 04/04/2015), Disp: 21 tablet, Rfl: 0 .  tetrahydrozoline 0.05 % ophthalmic solution, Place 1 drop into both eyes daily as needed (For dryness.)., Disp: , Rfl:  .  tinidazole (TINDAMAX) 500 MG tablet, Take 2 tablets (1,000 mg total) by mouth daily with breakfast., Disp: 10 tablet, Rfl: 2  Current facility-administered medications:  .  Tdap (BOOSTRIX) injection 0.5 mL, 0.5 mL, Intramuscular, Once, Antionette Char, MD Allergies  Allergen Reactions  . Augmentin [Amoxicillin-Pot Clavulanate] Diarrhea and Nausea And Vomiting       . Macrobid [Nitrofurantoin Macrocrystal] Hives  . Raspberry Swelling    Throat swelling  . Vicodin [Hydrocodone-Acetaminophen] Hives    Pt can take Acetaminophen  Social History  Substance Use Topics  . Smoking status: Former Smoker -- 0.50 packs/day    Types: Cigarettes    Quit date: 11/19/2012  . Smokeless tobacco: Never Used     Comment: Aug 2014  . Alcohol Use: Yes     Comment: Occasionally     Family History  Problem Relation Age of Onset  . Hypertension Mother   . Diabetes Father   . Cancer Paternal Aunt     female  . Diabetes Paternal Uncle   . Diabetes Paternal Grandmother       Review of Systems  Constitutional: negative for fatigue and weight loss Respiratory: negative for cough and wheezing Cardiovascular: negative for chest pain, fatigue and palpitations Gastrointestinal: negative for abdominal  pain and change in bowel habits Musculoskeletal:negative for myalgias Neurological: negative for gait problems and tremors Behavioral/Psych: negative for abusive relationship, depression Endocrine: negative for temperature intolerance   Genitourinary: positive for malodorous vaginal discharge Integument/breast: negative for breast lump, breast tenderness, nipple discharge and skin lesion(s)    Objective:       BP 123/74 mmHg  Pulse 91  Ht  (1.651 m)  Wt 198 lb (89.812 kg)  BMI 32.95 kg/m2 General:   alert  Skin:   no rash or abnormalities  Lungs:   clear to auscultation bilaterally  Heart:   regular rate and rhythm, S1, S2 normal, no murmur, click, rub or gallop  Breasts:   normal without suspicious masses, skin or nipple changes or axillary nodes  Abdomen:  normal findings: no organomegaly, soft, non-tender and no hernia  Pelvis:  External genitalia: normal general appearance Urinary system: urethral meatus normal and bladder without fullness, nontender Vaginal: normal without tenderness, induration or masses Cervix: normal appearance Adnexa: normal bimanual exam Uterus: anteverted and non-tender, normal size   Lab Review Urine pregnancy test Labs reviewed yes Radiologic studies reviewed no    Assessment:    Healthy female exam.    BV  Counseling and advice on contraception   Plan:   Tindamax Rx   Education reviewed: calcium supplements, low fat, low cholesterol diet, safe sex/STD prevention, self breast exams and weight bearing exercise. Contraception: NuvaRing vaginal inserts. Follow up in: 2 weeks.  Nexplanon removal  Meds ordered this encounter  Medications  . tinidazole (TINDAMAX) 500 MG tablet    Sig: Take 2 tablets (1,000 mg total) by mouth daily with breakfast.    Dispense:  10 tablet    Refill:  2   No orders of the defined types were placed in this encounter.

## 2015-04-05 LAB — PAP IG W/ RFLX HPV ASCU

## 2015-04-05 LAB — RPR

## 2015-04-05 LAB — HEPATITIS B SURFACE ANTIGEN: Hepatitis B Surface Ag: NEGATIVE

## 2015-04-05 LAB — HIV ANTIBODY (ROUTINE TESTING W REFLEX): HIV 1&2 Ab, 4th Generation: NONREACTIVE

## 2015-04-05 LAB — HEPATITIS C ANTIBODY: HCV Ab: NEGATIVE

## 2015-04-08 LAB — SURESWAB, VAGINOSIS/VAGINITIS PLUS
ATOPOBIUM VAGINAE: 7.1 Log (cells/mL)
C. albicans, DNA: NOT DETECTED
C. glabrata, DNA: NOT DETECTED
C. parapsilosis, DNA: NOT DETECTED
C. trachomatis RNA, TMA: DETECTED — AB
C. tropicalis, DNA: NOT DETECTED
GARDNERELLA VAGINALIS: 7.7 Log (cells/mL)
LACTOBACILLUS SPECIES: NOT DETECTED Log (cells/mL)
MEGASPHAERA SPECIES: 6.8 Log (cells/mL)
N. gonorrhoeae RNA, TMA: NOT DETECTED
T. VAGINALIS RNA, QL TMA: NOT DETECTED

## 2015-04-10 ENCOUNTER — Other Ambulatory Visit: Payer: Self-pay | Admitting: Obstetrics

## 2015-04-10 DIAGNOSIS — A749 Chlamydial infection, unspecified: Secondary | ICD-10-CM

## 2015-04-10 DIAGNOSIS — B9689 Other specified bacterial agents as the cause of diseases classified elsewhere: Secondary | ICD-10-CM

## 2015-04-10 DIAGNOSIS — N760 Acute vaginitis: Secondary | ICD-10-CM

## 2015-04-10 MED ORDER — CEFIXIME 400 MG PO TABS: 400.0000 mg | ORAL_TABLET | Freq: Every day | ORAL | Status: DC

## 2015-04-10 MED ORDER — AZITHROMYCIN 250 MG PO TABS: 1000.0000 mg | ORAL_TABLET | Freq: Once | ORAL | Status: DC

## 2015-04-10 MED ORDER — METRONIDAZOLE 500 MG PO TABS: 500.0000 mg | ORAL_TABLET | Freq: Two times a day (BID) | ORAL | Status: DC

## 2015-04-25 ENCOUNTER — Ambulatory Visit: Payer: BLUE CROSS/BLUE SHIELD | Admitting: Obstetrics

## 2015-04-27 ENCOUNTER — Telehealth: Payer: Self-pay | Admitting: *Deleted

## 2015-04-27 NOTE — Telephone Encounter (Signed)
Patient is interested in a Nexplanon Removal. Attempted to contact the patient and left message for patient to call the office.

## 2015-05-09 NOTE — Telephone Encounter (Signed)
Patient returned call to the office 9:46 am. Attempted to contact the patient 4:46 pm. Left message for patient to call the office.

## 2015-06-16 NOTE — Telephone Encounter (Signed)
Patient has not returned call to office. Call to be re-filed.  

## 2015-08-01 ENCOUNTER — Ambulatory Visit: Payer: BLUE CROSS/BLUE SHIELD | Admitting: Obstetrics

## 2016-06-14 ENCOUNTER — Emergency Department (HOSPITAL_BASED_OUTPATIENT_CLINIC_OR_DEPARTMENT_OTHER)
Admission: EM | Admit: 2016-06-14 | Discharge: 2016-06-14 | Disposition: A | Discharge status: Home / Self Care | Payer: Medicaid Other | Attending: Emergency Medicine | Admitting: Emergency Medicine

## 2016-06-14 ENCOUNTER — Encounter (HOSPITAL_BASED_OUTPATIENT_CLINIC_OR_DEPARTMENT_OTHER): Payer: Self-pay | Admitting: Emergency Medicine

## 2016-06-14 DIAGNOSIS — G43809 Other migraine, not intractable, without status migrainosus: Secondary | ICD-10-CM | POA: Diagnosis not present

## 2016-06-14 DIAGNOSIS — Z87891 Personal history of nicotine dependence: Secondary | ICD-10-CM | POA: Insufficient documentation

## 2016-06-14 DIAGNOSIS — R51 Headache: Secondary | ICD-10-CM | POA: Diagnosis present

## 2016-06-14 LAB — PREGNANCY, URINE: PREG TEST UR: NEGATIVE

## 2016-06-14 MED ORDER — PROMETHAZINE HCL 25 MG/ML IJ SOLN
25.0000 mg | Freq: Once | INTRAMUSCULAR | Status: AC
  Administered 2016-06-14: 25 mg via INTRAMUSCULAR
  Filled 2016-06-14: qty 1

## 2016-06-14 MED ORDER — KETOROLAC TROMETHAMINE 30 MG/ML IJ SOLN
30.0000 mg | Freq: Once | INTRAMUSCULAR | Status: AC
  Administered 2016-06-14: 30 mg via INTRAMUSCULAR
  Filled 2016-06-14: qty 1

## 2016-06-14 NOTE — ED Provider Notes (Signed)
[facility]   CSN: [id number] Arrival date & time: 06/14/16  1004     History   Chief Complaint Chief Complaint  Patient presents with  . Migraine    HPI Valerie [name] is a 26 y.o. female.  HPI   Patient is a 26 year old female with history of migraines who presents to the ED with complaint of headache. Patient reports she has had an gradual onset, intermittent headache for the past 2-1/2 weeks. She reports having squeezing/pressure sensation to the sides of her head that radiates around to her back. Patient reports her headache is worsened with bright lights or loud sounds. She also reports having mild tingling sensation to her left facial cheek. She reports her headache feels similar to migraines she has had in the past. Endorses associated nausea and reports having 2 episodes of vomiting over the past 2 days. Pt denies fever, neck stiffness, visual changes, eye pain/drainage, rhinorrhea, abdominal pain, urinary symptoms, numbness, tingling, weakness, seizures, syncope, dizziness.  Patient states she has been taking Tylenol, ibuprofen and Aleve at home with only mild intermittent relief. She states she tried to see her PCP today but their office was closed due to it being Good Friday.  PCP- Dr. Barbaraann Barthel  Past Medical History:  Diagnosis Date  . Abnormal Pap smear   . Eczema   . Gastric ulcer    2000  . Infection    UTI, chronic sinus inf  . Migraine   . Ovarian cyst   . UTI (urinary tract infection)     Patient Active Problem List   Diagnosis Date Noted  . Skin tag of perianal region 12/14/2013  . Depressive disorder, not elsewhere classified 12/14/2013  . Normal delivery 07/07/2013  . Active labor 07/06/2013  . Backache 07/05/2013  . BV (bacterial vaginosis) 12/24/2012  . Former smoker, stopped smoking in distant past 11/27/2012  . Supervision of normal first pregnancy 11/27/2012  . Family history of diabetes in pregnancy 11/27/2012  .  Constipation 11/27/2012  . Nausea 11/27/2012  . History of facial weakness/Neuro workup/Lesion on MRI w/out change 11/27/2012    Past Surgical History:  Procedure Laterality Date  . ADENOIDECTOMY    . COLPOSCOPY    . MYRINGOTOMY WITH TUBE PLACEMENT      OB History    Gravida Para Term Preterm AB Living   SAB TAB Ectopic Multiple Live Births   2       1       Home Medications    Prior to Admission medications   Medication Sig Start Date End Date Taking? Authorizing Provider  busPIRone (BUSPAR) 7.5 MG tablet Take 7.5 mg by mouth 3 (three) times daily.   Yes Historical Provider, MD  acetaminophen (TYLENOL) 500 MG tablet Take 1,000 mg by mouth every 6 (six) hours as needed for headache (headache). Reported on 04/04/2015    Historical Provider, MD  escitalopram (LEXAPRO) 10 MG tablet Take 10 mg by mouth at bedtime.  06/24/14   Historical Provider, MD    Family History Family History  Problem Relation Age of Onset  . Hypertension Mother   . Diabetes Father   . Cancer Paternal Aunt     female  . Diabetes Paternal Uncle   . Diabetes Paternal Grandmother     Social History Social History  Substance Use Topics  . Smoking status: Former Smoker    Packs/day: 0.50    Types: Cigarettes  Quit date: 11/19/2012  . Smokeless tobacco: Never Used     Comment: Aug 2014  . Alcohol use Yes     Comment: Occasionally      Allergies   Augmentin [amoxicillin-pot clavulanate]; Macrobid [nitrofurantoin macrocrystal]; Raspberry; and Vicodin [hydrocodone-acetaminophen]   Review of Systems Review of Systems  Eyes: Positive for photophobia.  Gastrointestinal: Positive for nausea and vomiting.  Neurological: Positive for headaches.  All other systems reviewed and are negative.    Physical Exam Updated Vital Signs BP 120/88 (BP Location: Left Arm)   Pulse 98   Temp 97.8 F (36.6 C) (Oral)   Resp 16   Ht  (1.651 m)   Wt 84.4 kg   SpO2 100%   BMI 30.95 kg/m     Physical Exam  Constitutional: She is oriented to person, place, and time. She appears well-developed and well-nourished. No distress.  HENT:  Head: Normocephalic and atraumatic.  Right Ear: Tympanic membrane normal.  Left Ear: Tympanic membrane normal.  Nose: Nose normal. Right sinus exhibits no maxillary sinus tenderness and no frontal sinus tenderness. Left sinus exhibits no maxillary sinus tenderness and no frontal sinus tenderness.  Mouth/Throat: Uvula is midline, oropharynx is clear and moist and mucous membranes are normal. No oropharyngeal exudate, posterior oropharyngeal edema, posterior oropharyngeal erythema or tonsillar abscesses.  Eyes: Conjunctivae and EOM are normal. Pupils are equal, round, and reactive to light. Right eye exhibits no discharge. Left eye exhibits no discharge. No scleral icterus.  Neck: Normal range of motion and full passive range of motion without pain. Neck supple. No spinous process tenderness and no muscular tenderness present. No neck rigidity. Normal range of motion present.  Cardiovascular: Normal rate, regular rhythm, normal heart sounds and intact distal pulses.   Pulmonary/Chest: Effort normal and breath sounds normal. No respiratory distress. She has no wheezes. She has no rales. She exhibits no tenderness.  Abdominal: Soft. Bowel sounds are normal. She exhibits no distension and no mass. There is no tenderness. There is no rebound and no guarding. No hernia.  Musculoskeletal: Normal range of motion. She exhibits no edema.  Full range of motion of neck and back. Full range of motion of bilateral upper and lower extremities, with 5/5 strength. Sensation intact. 2+ radial and PT pulses. Cap refill <2 seconds.   Lymphadenopathy:    She has no cervical adenopathy.  Neurological: She is alert and oriented to person, place, and time. She has normal strength. No cranial nerve deficit or sensory deficit. She displays a negative Romberg sign. Coordination  normal.  Patient able to stand and ambulate, no ataxia noted.  Skin: Skin is warm and dry. She is not diaphoretic.  Nursing note and vitals reviewed.    ED Treatments / Results  Labs (all labs ordered are listed, but only abnormal results are displayed) Labs Reviewed  PREGNANCY, URINE    EKG  EKG Interpretation None       Radiology No results found.  Procedures Procedures (including critical care time)  Medications Ordered in ED Medications  promethazine (PHENERGAN) injection 25 mg (25 mg Intramuscular Given 06/14/16 1046)  ketorolac (TORADOL) 30 MG/ML injection 30 mg (30 mg Intramuscular Given 06/14/16 1121)     Initial Impression / Assessment and Plan / ED Course  I have reviewed the triage vital signs and the nursing notes.  Pertinent labs & imaging results that were available during my care of the patient were reviewed by me and considered in my medical decision making (see chart for  details).     Pt Presents with headache consistent with her typical migraines. VSS. Exam unremarkable. No neuro deficits. Pregnancy negative. HA treated and improved while in ED.  Presentation is like pts typical HA and non concerning for [facility], ICH, Meningitis, or temporal arteritis. Pt is afebrile with no focal neuro deficits, nuchal rigidity, or change in vision. Pt is to follow up with PCP to discuss prophylactic medication. Pt verbalizes understanding and is agreeable with plan to dc.  Discussed return precautions.   Final Clinical Impressions(s) / ED Diagnoses   Final diagnoses:  Other migraine without status migrainosus, not intractable    New Prescriptions New Prescriptions   No medications on file     Barrett Henle, PA-C 06/14/16 1134    Tilden Fossa, MD 06/14/16 [phone]

## 2016-06-14 NOTE — ED Notes (Signed)
ED Provider at bedside. 

## 2016-06-14 NOTE — ED Triage Notes (Signed)
H/a x 2 weeks

## 2016-06-14 NOTE — Discharge Instructions (Signed)
Continue taking Tylenol and ibuprofen at home as an for pain relief. I also recommend taking her prescription of Phenergan as prescribed as needed for nausea. Continue drinking fluids at home to remain hydrated. Follow-up with your primary care provider within the next 4-5 days if your symptoms have not improved. Please return to the Emergency Department if symptoms worsen or new onset of fever, neck stiffness, visual changes, , abdominal pain, vomiting, urinary symptoms, numbness, tingling, weakness, seizures, syncope.

## 2016-06-27 ENCOUNTER — Other Ambulatory Visit (HOSPITAL_COMMUNITY)
Admission: RE | Admit: 2016-06-27 | Discharge: 2016-06-27 | Disposition: A | Discharge status: Home / Self Care | Payer: Medicaid Other | Source: Ambulatory Visit | Attending: Obstetrics | Admitting: Obstetrics

## 2016-06-27 ENCOUNTER — Encounter: Payer: Self-pay | Admitting: Obstetrics

## 2016-06-27 ENCOUNTER — Ambulatory Visit (INDEPENDENT_AMBULATORY_CARE_PROVIDER_SITE_OTHER): Payer: Medicaid Other | Admitting: Obstetrics

## 2016-06-27 VITALS — BP 131/86 | HR 92 | Temp 97.4°F | Ht 65.0 in | Wt 189.7 lb

## 2016-06-27 DIAGNOSIS — N898 Other specified noninflammatory disorders of vagina: Secondary | ICD-10-CM | POA: Insufficient documentation

## 2016-06-27 DIAGNOSIS — N393 Stress incontinence (female) (male): Secondary | ICD-10-CM

## 2016-06-27 DIAGNOSIS — Z3046 Encounter for surveillance of implantable subdermal contraceptive: Secondary | ICD-10-CM

## 2016-06-27 DIAGNOSIS — Z01419 Encounter for gynecological examination (general) (routine) without abnormal findings: Secondary | ICD-10-CM

## 2016-06-27 NOTE — Progress Notes (Signed)
Patient presents for annual exam. She wants STD Testing.

## 2016-06-27 NOTE — Progress Notes (Signed)
Subjective:        Valerie [name] is a 26 y.o. female here for a routine exam.  Current complaints: Leaking urine when cough, sneeze, etc.  Denies pain or frequency.. This incontinence developed after birth of last child 3 years ago.  Personal health questionnaire:  Is patient Ashkenazi Jewish, have a family history of breast and/or ovarian cancer: no Is there a family history of uterine cancer diagnosed at age < 6, gastrointestinal cancer, urinary tract cancer, family member who is a Personnel officer syndrome-associated carrier: no Is the patient overweight and hypertensive, family history of diabetes, personal history of gestational diabetes, preeclampsia or PCOS: no Is patient over 23, have PCOS,  family history of premature CHD under age 28, diabetes, smoke, have hypertension or peripheral artery disease:  no At any time, has a partner hit, kicked or otherwise hurt or frightened you?: no Over the past 2 weeks, have you felt down, depressed or hopeless?: no Over the past 2 weeks, have you felt little interest or pleasure in doing things?:no   Gynecologic History No LMP recorded (lmp unknown). Patient has had an implant. Contraception: Nexplanon Last Pap: 2017. Results were: normal Last mammogram: n/a. Results were: n/a  Obstetric History OB History  Gravida Para Term Preterm AB Living  SAB TAB Ectopic Multiple Live Births  2       1    # Outcome Date GA Lbr Len/2nd Weight Sex Delivery Anes PTL Lv  3 Term 07/07/13 [redacted]w[redacted]d 10:05 / 01:11 6 lb 4.5 oz (2.85 kg) M Vag-Spont EPI  LIV  2 SAB 02/16/12 [redacted]w[redacted]d         1 SAB 12/17/03 [redacted]w[redacted]d             Past Medical History:  Diagnosis Date  . Abnormal Pap smear   . Eczema   . Gastric ulcer    2000  . Infection    UTI, chronic sinus inf  . Migraine   . Ovarian cyst   . UTI (urinary tract infection)     Past Surgical History:  Procedure Laterality Date  . ADENOIDECTOMY    . COLPOSCOPY    . MYRINGOTOMY WITH TUBE PLACEMENT        Current Outpatient Prescriptions:  .  acetaminophen (TYLENOL) 500 MG tablet, Take 1,000 mg by mouth every 6 (six) hours as needed for headache (headache). Reported on 04/04/2015, Disp: , Rfl:  .  busPIRone (BUSPAR) 7.5 MG tablet, Take 7.5 mg by mouth 3 (three) times daily., Disp: , Rfl:  .  escitalopram (LEXAPRO) 10 MG tablet, Take 10 mg by mouth at bedtime. , Disp: , Rfl: 0  Current Facility-Administered Medications:  .  Tdap (BOOSTRIX) injection 0.5 mL, 0.5 mL, Intramuscular, Once, Antionette Char, MD Allergies  Allergen Reactions  . Augmentin [Amoxicillin-Pot Clavulanate] Diarrhea and Nausea And Vomiting       . Macrobid [Nitrofurantoin Macrocrystal] Hives  . Raspberry Swelling    Throat swelling  . Vicodin [Hydrocodone-Acetaminophen] Hives    Pt can take Acetaminophen    Social History  Substance Use Topics  . Smoking status: Current Every Day Smoker    Packs/day: 0.50    Types: Cigarettes  . Smokeless tobacco: Current User     Comment: Aug 2014  . Alcohol use Yes     Comment: Occasionally     Family History  Problem Relation Age of Onset  . Hypertension Mother   . Diabetes Father   .  Cancer Paternal Aunt     female  . Diabetes Paternal Uncle   . Diabetes Paternal Grandmother       Review of Systems  Constitutional: negative for fatigue and weight loss Respiratory: negative for cough and wheezing Cardiovascular: negative for chest pain, fatigue and palpitations Gastrointestinal: negative for abdominal pain and change in bowel habits Musculoskeletal:negative for myalgias Neurological: negative for gait problems and tremors Behavioral/Psych: negative for abusive relationship, depression Endocrine: negative for temperature intolerance    Genitourinary:negative for abnormal menstrual periods, genital lesions, hot flashes, sexual problems and vaginal discharge.  Positive for leaking urine with cough, sneeze, etc. Integument/breast: negative for breast lump,  breast tenderness, nipple discharge and skin lesion(s)    Objective:       BP 131/86   Pulse 92   Temp 97.4 F (36.3 C)   Ht  (1.651 m)   Wt 189 lb 11.2 oz (86 kg)   LMP  (LMP Unknown)   Breastfeeding? No   BMI 31.57 kg/m  General:   alert  Skin:   no rash or abnormalities  Lungs:   clear to auscultation bilaterally  Heart:   regular rate and rhythm, S1, S2 normal, no murmur, click, rub or gallop  Breasts:   normal without suspicious masses, skin or nipple changes or axillary nodes  Abdomen:  normal findings: no organomegaly, soft, non-tender and no hernia  Pelvis:  External genitalia: normal general appearance Urinary system: urethral meatus normal and bladder without fullness, nontender Vaginal: normal without tenderness, induration or masses Cervix: normal appearance Adnexa: normal bimanual exam Uterus: anteverted and non-tender, normal size   Lab Review Urine pregnancy test Labs reviewed yes Radiologic studies reviewed no  50% of 20 min visit spent on counseling and coordination of care.    Assessment:    Healthy female exam.   SUI Contraceptive management.  Nexplanon expires in June 2018.  Wants to switch to NuvaRing at that time.   Plan:   Referred to Urogynecology for SUI F/U in 2 months for Nexplanon Removal  Education reviewed: calcium supplements, depression evaluation, low fat, low cholesterol diet, safe sex/STD prevention, self breast exams, skin cancer screening and weight bearing exercise. Contraception: NuvaRing vaginal inserts. Follow up in: 1 year. Annual.  No orders of the defined types were placed in this encounter.  Orders Placed This Encounter  Procedures  . Ambulatory referral to Urogynecology    Referral Priority:   Routine    Referral Type:   Consultation    Referral Reason:   Specialty Services Required    Requested Specialty:   Urology    Number of Visits Requested:   1       Patient ID: Valerie [name], female   DOB:  1990/06/19, 25 y.o.   MRN: [id number]

## 2016-06-28 LAB — CERVICOVAGINAL ANCILLARY ONLY
Chlamydia: NEGATIVE
Neisseria Gonorrhea: NEGATIVE
Trichomonas: NEGATIVE

## 2016-07-01 ENCOUNTER — Encounter: Payer: Self-pay | Admitting: *Deleted

## 2016-07-02 LAB — CYTOLOGY - PAP: Diagnosis: NEGATIVE

## 2016-08-21 ENCOUNTER — Ambulatory Visit (INDEPENDENT_AMBULATORY_CARE_PROVIDER_SITE_OTHER): Payer: Medicaid Other | Admitting: Obstetrics

## 2016-08-21 ENCOUNTER — Encounter: Payer: Self-pay | Admitting: Obstetrics

## 2016-08-21 VITALS — BP 112/77 | HR 77 | Ht 65.0 in | Wt 198.8 lb

## 2016-08-21 DIAGNOSIS — Z3049 Encounter for surveillance of other contraceptives: Secondary | ICD-10-CM | POA: Diagnosis not present

## 2016-08-21 DIAGNOSIS — Z3202 Encounter for pregnancy test, result negative: Secondary | ICD-10-CM | POA: Diagnosis not present

## 2016-08-21 DIAGNOSIS — Z30015 Encounter for initial prescription of vaginal ring hormonal contraceptive: Secondary | ICD-10-CM

## 2016-08-21 DIAGNOSIS — Z3046 Encounter for surveillance of implantable subdermal contraceptive: Secondary | ICD-10-CM

## 2016-08-21 LAB — POCT URINE PREGNANCY: Preg Test, Ur: NEGATIVE

## 2016-08-21 MED ORDER — ETONOGESTREL-ETHINYL ESTRADIOL 0.12-0.015 MG/24HR VA RING: VAGINAL_RING | VAGINAL | 12 refills | Status: DC

## 2016-08-21 NOTE — Progress Notes (Signed)

## 2016-08-21 NOTE — Progress Notes (Signed)
Patient is in the office for Nexplanon removal and is wanting to switch to the NuVaring. LMP 08-06-16.

## 2016-08-29 ENCOUNTER — Encounter: Payer: Self-pay | Admitting: Obstetrics

## 2016-09-05 ENCOUNTER — Encounter: Payer: Self-pay | Admitting: Obstetrics

## 2016-09-05 ENCOUNTER — Ambulatory Visit (INDEPENDENT_AMBULATORY_CARE_PROVIDER_SITE_OTHER): Payer: Medicaid Other | Admitting: Obstetrics

## 2016-09-05 VITALS — BP 115/76 | HR 88 | Wt 198.0 lb

## 2016-09-05 DIAGNOSIS — Z3046 Encounter for surveillance of implantable subdermal contraceptive: Secondary | ICD-10-CM

## 2016-09-05 DIAGNOSIS — R5383 Other fatigue: Secondary | ICD-10-CM

## 2016-09-05 DIAGNOSIS — F17200 Nicotine dependence, unspecified, uncomplicated: Secondary | ICD-10-CM

## 2016-09-05 DIAGNOSIS — Z3049 Encounter for surveillance of other contraceptives: Secondary | ICD-10-CM | POA: Diagnosis not present

## 2016-09-05 NOTE — Progress Notes (Signed)
Subjective:    Valerie [name] is a 26 y.o. female who presents for follow up after Nexplanon removal. The patient has no complaints today. The patient is sexually active. Pertinent past medical history: current smoker.  The information documented in the HPI was reviewed and verified.  Menstrual History: OB History    Gravida Para Term Preterm AB Living   3 1 1   2 1    SAB TAB Ectopic Multiple Live Births   2       1      Patient's last menstrual period was 08/06/2016 (exact date).   Patient Active Problem List   Diagnosis Date Noted  . Skin tag of perianal region 12/14/2013  . Depressive disorder, not elsewhere classified 12/14/2013  . Normal delivery 07/07/2013  . Active labor 07/06/2013  . Backache 07/05/2013  . BV (bacterial vaginosis) 12/24/2012  . Former smoker, stopped smoking in distant past 11/27/2012  . Supervision of normal first pregnancy 11/27/2012  . Family history of diabetes in pregnancy 11/27/2012  . Constipation 11/27/2012  . Nausea 11/27/2012  . History of facial weakness/Neuro workup/Lesion on MRI w/out change 11/27/2012   Past Medical History:  Diagnosis Date  . Abnormal Pap smear   . Eczema   . Gastric ulcer    2000  . Infection    UTI, chronic sinus inf  . Migraine   . Ovarian cyst   . UTI (urinary tract infection)     Past Surgical History:  Procedure Laterality Date  . ADENOIDECTOMY    . COLPOSCOPY    . MYRINGOTOMY WITH TUBE PLACEMENT       Current Outpatient Prescriptions:  .  acetaminophen (TYLENOL) 500 MG tablet, Take 1,000 mg by mouth every 6 (six) hours as needed for headache (headache). Reported on 04/04/2015, Disp: , Rfl:  .  busPIRone (BUSPAR) 7.5 MG tablet, Take 7.5 mg by mouth 3 (three) times daily., Disp: , Rfl:  .  escitalopram (LEXAPRO) 10 MG tablet, Take 10 mg by mouth at bedtime. , Disp: , Rfl: 0 .  etonogestrel-ethinyl estradiol (NUVARING) 0.12-0.015 MG/24HR vaginal ring, Insert vaginally and leave in place for 3  consecutive weeks, then remove for 1 week., Disp: 1 each, Rfl: 12  Current Facility-Administered Medications:  .  Tdap (BOOSTRIX) injection 0.5 mL, 0.5 mL, Intramuscular, Once, Antionette Char, MD Allergies  Allergen Reactions  . Augmentin [Amoxicillin-Pot Clavulanate] Diarrhea and Nausea And Vomiting       . Macrobid [Nitrofurantoin Macrocrystal] Hives  . Raspberry Swelling    Throat swelling  . Vicodin [Hydrocodone-Acetaminophen] Hives    Pt can take Acetaminophen    Social History  Substance Use Topics  . Smoking status: Current Every Day Smoker    Packs/day: 0.50    Types: Cigarettes  . Smokeless tobacco: Current User     Comment: Aug 2014  . Alcohol use Yes     Comment: Occasionally     Family History  Problem Relation Age of Onset  . Hypertension Mother   . Diabetes Father   . Cancer Paternal Aunt        female  . Diabetes Paternal Uncle   . Diabetes Paternal Grandmother        Review of Systems Constitutional: negative for weight loss Genitourinary:negative for abnormal menstrual periods and vaginal discharge   Objective:   BP 115/76   Pulse 88   Wt 198 lb (89.8 kg)   LMP 08/06/2016 (Exact Date)   BMI 32.95 kg/m   PE:  General:  Alert and no distress          Left Upper Extremity:  Nexplanon removal site C, D, I.  Non tender.  Suture removed.  Lab Review Urine pregnancy test Labs reviewed yes Radiologic studies reviewed no  50% of 15 min visit spent on counseling and coordination of care.    Assessment:    26 y.o., discontinuing Nexplanon and starting NuvaRing, no contraindications.  Tobacco cessation encouraged.  Plan:    All questions answered. Contraception: NuvaRing vaginal inserts. Follow up in 1 year.   Orders Placed This Encounter  Procedures  . CBC  . TSH  . Comprehensive metabolic panel

## 2016-09-05 NOTE — Progress Notes (Signed)
Patient presents for FU after Nexplanon Removal. Complains of fatigue, and dizziness. Denies nausea and vomiting.

## 2016-09-06 LAB — COMPREHENSIVE METABOLIC PANEL
ALBUMIN: 4.3 g/dL (ref 3.5–5.5)
ALK PHOS: 112 IU/L (ref 39–117)
ALT: 10 IU/L (ref 0–32)
AST: 12 IU/L (ref 0–40)
Albumin/Globulin Ratio: 2 (ref 1.2–2.2)
BUN/Creatinine Ratio: 12 (ref 9–23)
BUN: 10 mg/dL (ref 6–20)
Bilirubin Total: <0.2 mg/dL (ref 0.0–1.2)
CO2: 20 mmol/L (ref 20–29)
CREATININE: 0.81 mg/dL (ref 0.57–1.00)
Calcium: 9.4 mg/dL (ref 8.7–10.2)
Chloride: 106 mmol/L (ref 96–106)
GFR calc Af Amer: 115 mL/min/{1.73_m2} (ref >59)
GFR calc non Af Amer: 100 mL/min/{1.73_m2} (ref >59)
GLUCOSE: 99 mg/dL (ref 65–99)
Globulin, Total: 2.2 g/dL (ref 1.5–4.5)
Potassium: 4.1 mmol/L (ref 3.5–5.2)
Sodium: 142 mmol/L (ref 134–144)
Total Protein: 6.5 g/dL (ref 6.0–8.5)

## 2016-09-06 LAB — CBC
HEMATOCRIT: 37.3 % (ref 34.0–46.6)
Hemoglobin: 12.3 g/dL (ref 11.1–15.9)
MCH: 30.3 pg (ref 26.6–33.0)
MCHC: 33 g/dL (ref 31.5–35.7)
MCV: 92 fL (ref 79–97)
Platelets: 224 10*3/uL (ref 150–379)
RBC: 4.06 x10E6/uL (ref 3.77–5.28)
RDW: 13.6 % (ref 12.3–15.4)
WBC: 8.4 10*3/uL (ref 3.4–10.8)

## 2016-09-06 LAB — TSH: TSH: 1.98 u[IU]/mL (ref 0.450–4.500)

## 2016-09-11 ENCOUNTER — Telehealth: Payer: Self-pay

## 2016-09-11 NOTE — Telephone Encounter (Signed)
Pt called and said she received VM from our office but they did not leave name. No documentation of anyone contacting pt. All recent test results WNL.

## 2017-03-12 ENCOUNTER — Other Ambulatory Visit: Payer: Self-pay

## 2017-03-12 ENCOUNTER — Emergency Department (HOSPITAL_BASED_OUTPATIENT_CLINIC_OR_DEPARTMENT_OTHER)
Admission: EM | Admit: 2017-03-12 | Discharge: 2017-03-12 | Disposition: A | Discharge status: Home / Self Care | Payer: Medicaid Other | Attending: Emergency Medicine | Admitting: Emergency Medicine

## 2017-03-12 ENCOUNTER — Encounter (HOSPITAL_BASED_OUTPATIENT_CLINIC_OR_DEPARTMENT_OTHER): Payer: Self-pay | Admitting: Emergency Medicine

## 2017-03-12 DIAGNOSIS — R197 Diarrhea, unspecified: Secondary | ICD-10-CM | POA: Diagnosis not present

## 2017-03-12 DIAGNOSIS — R112 Nausea with vomiting, unspecified: Secondary | ICD-10-CM

## 2017-03-12 DIAGNOSIS — F17210 Nicotine dependence, cigarettes, uncomplicated: Secondary | ICD-10-CM | POA: Diagnosis not present

## 2017-03-12 DIAGNOSIS — Z79899 Other long term (current) drug therapy: Secondary | ICD-10-CM | POA: Insufficient documentation

## 2017-03-12 LAB — CBC
HCT: 40.7 % (ref 36.0–46.0)
Hemoglobin: 13.3 g/dL (ref 12.0–15.0)
MCH: 30.1 pg (ref 26.0–34.0)
MCHC: 32.7 g/dL (ref 30.0–36.0)
MCV: 92.1 fL (ref 78.0–100.0)
Platelets: 181 10*3/uL (ref 150–400)
RBC: 4.42 MIL/uL (ref 3.87–5.11)
RDW: 13.1 % (ref 11.5–15.5)
WBC: 9.4 10*3/uL (ref 4.0–10.5)

## 2017-03-12 LAB — LIPASE, BLOOD: Lipase: 23 U/L (ref 11–51)

## 2017-03-12 LAB — URINALYSIS, ROUTINE W REFLEX MICROSCOPIC
Bilirubin Urine: NEGATIVE
Glucose, UA: NEGATIVE mg/dL
Hgb urine dipstick: NEGATIVE
Ketones, ur: 40 mg/dL — AB
Leukocytes, UA: NEGATIVE
Nitrite: NEGATIVE
Protein, ur: NEGATIVE mg/dL
Specific Gravity, Urine: 1.025 (ref 1.005–1.030)
pH: 6 (ref 5.0–8.0)

## 2017-03-12 LAB — COMPREHENSIVE METABOLIC PANEL
ALT: 13 U/L — ABNORMAL LOW (ref 14–54)
AST: 16 U/L (ref 15–41)
Albumin: 3.7 g/dL (ref 3.5–5.0)
Alkaline Phosphatase: 122 U/L (ref 38–126)
Anion gap: 11 (ref 5–15)
BUN: 12 mg/dL (ref 6–20)
CO2: 17 mmol/L — ABNORMAL LOW (ref 22–32)
Calcium: 8.7 mg/dL — ABNORMAL LOW (ref 8.9–10.3)
Chloride: 108 mmol/L (ref 101–111)
Creatinine, Ser: 0.7 mg/dL (ref 0.44–1.00)
GFR calc Af Amer: >60 mL/min (ref >60)
GFR calc non Af Amer: >60 mL/min (ref >60)
Glucose, Bld: 115 mg/dL — ABNORMAL HIGH (ref 65–99)
Potassium: 4 mmol/L (ref 3.5–5.1)
Sodium: 136 mmol/L (ref 135–145)
Total Bilirubin: 0.4 mg/dL (ref 0.3–1.2)
Total Protein: 7.3 g/dL (ref 6.5–8.1)

## 2017-03-12 LAB — PREGNANCY, URINE: Preg Test, Ur: NEGATIVE

## 2017-03-12 MED ORDER — SODIUM CHLORIDE 0.9 % IV BOLUS (SEPSIS)
1000.0000 mL | Freq: Once | INTRAVENOUS | Status: AC
  Administered 2017-03-12: 1000 mL via INTRAVENOUS

## 2017-03-12 MED ORDER — ONDANSETRON HCL 4 MG/2ML IJ SOLN
4.0000 mg | Freq: Once | INTRAMUSCULAR | Status: AC
  Administered 2017-03-12: 4 mg via INTRAVENOUS
  Filled 2017-03-12: qty 2

## 2017-03-12 MED ORDER — MORPHINE SULFATE (PF) 4 MG/ML IV SOLN
4.0000 mg | Freq: Once | INTRAVENOUS | Status: AC
  Administered 2017-03-12: 4 mg via INTRAVENOUS
  Filled 2017-03-12: qty 1

## 2017-03-12 MED ORDER — ONDANSETRON HCL 4 MG PO TABS: 4.0000 mg | ORAL_TABLET | Freq: Four times a day (QID) | ORAL | 0 refills | Status: DC

## 2017-03-12 MED ORDER — KETOROLAC TROMETHAMINE 15 MG/ML IJ SOLN
15.0000 mg | Freq: Once | INTRAMUSCULAR | Status: AC
  Administered 2017-03-12: 15 mg via INTRAVENOUS
  Filled 2017-03-12: qty 1

## 2017-03-12 MED FILL — ONDANSETRON HCL 4 MG TABLET: 4 | 3 days supply | Qty: 12 | Fill #0

## 2017-03-12 NOTE — ED Notes (Signed)
ED Provider at bedside. 

## 2017-03-12 NOTE — ED Notes (Signed)
Pt reports taking 8mg  ODT Zofran around 0830 with no relief.

## 2017-03-12 NOTE — ED Notes (Signed)
Pt educated about not driving or performing other critical tasks (such as operating heavy machinery, caring for infant/toddler/child) due to sedative nature of medications received in ED. Also warned about risks of consuming alcohol or taking other medications with sedative properties. Pt/caregiver verbalized understanding.  

## 2017-03-12 NOTE — ED Notes (Signed)
Pt reports feeling dizzy after standing up from toilet; pt able to ambulate back to bed without difficulty. Vital signs WNL; dizziness resolved when sitting down. MD made aware -- no new orders received at this time.

## 2017-03-12 NOTE — ED Triage Notes (Signed)
N/V/D since yesterday.  Pt states 30 episodes on V/D in 24 hours.  Pt feels weak and dizzy.  No fever noted.

## 2017-03-17 NOTE — ED Provider Notes (Signed)
[facility] EMERGENCY DEPARTMENT Provider Note   CSN: [id number] Arrival date & time: 03/12/17  1029     History   Chief Complaint Chief Complaint  Patient presents with  . Emesis  . Diarrhea    HPI Valerie [name] is a 26 y.o. female.  HPI   26 year old female with nausea/vomiting/diarrhea.  Onset yesterday.  Persistent since then.  Innumerable instances of each.  No blood in stool or emesis.  Subjective fever.  He was generally weak and dizzy.  No sick contacts.   Crampy abdominal pain.  No urinary complaints.  Past Medical History:  Diagnosis Date  . Abnormal Pap smear   . Eczema   . Gastric ulcer    2000  . Infection    UTI, chronic sinus inf  . Migraine   . Ovarian cyst   . UTI (urinary tract infection)     Patient Active Problem List   Diagnosis Date Noted  . Skin tag of perianal region 12/14/2013  . Depressive disorder, not elsewhere classified 12/14/2013  . Normal delivery 07/07/2013  . Active labor 07/06/2013  . Backache 07/05/2013  . BV (bacterial vaginosis) 12/24/2012  . Former smoker, stopped smoking in distant past 11/27/2012  . Supervision of normal first pregnancy 11/27/2012  . Family history of diabetes in pregnancy 11/27/2012  . Constipation 11/27/2012  . Nausea 11/27/2012  . History of facial weakness/Neuro workup/Lesion on MRI w/out change 11/27/2012    Past Surgical History:  Procedure Laterality Date  . ADENOIDECTOMY    . COLPOSCOPY    . MYRINGOTOMY WITH TUBE PLACEMENT      OB History    Gravida Para Term Preterm AB Living   3 1 1   2 1    SAB TAB Ectopic Multiple Live Births   2       1       Home Medications    Prior to Admission medications   Medication Sig Start Date End Date Taking? Authorizing Provider  acetaminophen (TYLENOL) 500 MG tablet Take 1,000 mg by mouth every 6 (six) hours as needed for headache (headache). Reported on 04/04/2015    [provider]  busPIRone (BUSPAR) 7.5 MG tablet Take  7.5 mg by mouth 3 (three) times daily.    [provider]  escitalopram (LEXAPRO) 10 MG tablet Take 10 mg by mouth at bedtime.  06/24/14   [provider]  etonogestrel-ethinyl estradiol (NUVARING) 0.12-0.015 MG/24HR vaginal ring Insert vaginally and leave in place for 3 consecutive weeks, then remove for 1 week. 08/21/16   Brock [name], Charles A, MD  ondansetron (ZOFRAN) 4 MG tablet Take 1 tablet (4 mg total) by mouth every 6 (six) hours. 03/12/17   Raeford RazorKohut, Harlyn Italiano, MD    Family History Family History  Problem Relation Age of Onset  . Hypertension Mother   . Diabetes Father   . Cancer Paternal Aunt        female  . Diabetes Paternal Uncle   . Diabetes Paternal Grandmother     Social History Social History   Tobacco Use  . Smoking status: Current Every Day Smoker    Packs/day: 0.50    Types: Cigarettes  . Smokeless tobacco: Current User  . Tobacco comment: Aug 2014  Substance Use Topics  . Alcohol use: Yes    Comment: Occasionally   . Drug use: No     Allergies   Augmentin [amoxicillin-pot clavulanate]; Hydrocodone; Macrobid [nitrofurantoin macrocrystal]; and Raspberry   Review of Systems Review of Systems  Physical Exam Updated Vital Signs BP 121/81 (BP Location: Left Arm)   Pulse 89   Temp 98.2 F (36.8 C) (Oral)   Resp 18   Ht 5\' 5"  (1.651 m)   Wt 90.7 kg (200 lb)   LMP 12/31/2016   SpO2 100%   BMI 33.28 kg/m   Physical Exam  Constitutional: She appears well-developed and well-nourished. No distress.  HENT:  Head: Normocephalic and atraumatic.  Eyes: Conjunctivae are normal. Right eye exhibits no discharge. Left eye exhibits no discharge.  Neck: Neck supple.  Cardiovascular: Regular rhythm and normal heart sounds. Exam reveals no gallop and no friction rub.  No murmur heard. Mild tachycardia  Pulmonary/Chest: Effort normal and breath sounds normal. No respiratory distress.  Abdominal: Soft. She exhibits no distension. There is no  tenderness.  Musculoskeletal: She exhibits no edema or tenderness.  Neurological: She is alert.  Skin: Skin is warm and dry.  Psychiatric: She has a normal mood and affect. Her behavior is normal. Thought content normal.  Nursing note and vitals reviewed.    ED Treatments / Results  Labs (all labs ordered are listed, but only abnormal results are displayed) Labs Reviewed  URINALYSIS, ROUTINE W REFLEX MICROSCOPIC - Abnormal; Notable for the following components:      Result Value   Ketones, ur 40 (*)    All other components within normal limits  COMPREHENSIVE METABOLIC PANEL - Abnormal; Notable for the following components:   CO2 17 (*)    Glucose, Bld 115 (*)    Calcium 8.7 (*)    ALT 13 (*)    All other components within normal limits  PREGNANCY, URINE  LIPASE, BLOOD  CBC    EKG  EKG Interpretation None       Radiology No results found.  Procedures Procedures (including critical care time)  Medications Ordered in ED Medications  sodium chloride 0.9 % bolus 1,000 mL (0 mLs Intravenous Stopped 03/12/17 1152)  ondansetron (ZOFRAN) injection 4 mg (4 mg Intravenous Given 03/12/17 1144)  ketorolac (TORADOL) 15 MG/ML injection 15 mg (15 mg Intravenous Given 03/12/17 1143)  morphine 4 MG/ML injection 4 mg (4 mg Intravenous Given 03/12/17 1148)  sodium chloride 0.9 % bolus 1,000 mL (0 mLs Intravenous Stopped 03/12/17 1251)     Initial Impression / Assessment and Plan / ED Course  I have reviewed the triage vital signs and the nursing notes.  Pertinent labs & imaging results that were available during my care of the patient were reviewed by me and considered in my medical decision making (see chart for details).     26 year old female with nausea/vomiting/diarrhea.  Suspect viral illness.  Abdominal exam is benign.  Tachycardia is improved with IV fluids.  She ambulated to the bathroom independently.  Still feels somewhat dizzy but better than she has been.  I doubt  emergent process.  Plan continued symptom medic treatment.  Return precautions were discussed.  Final Clinical Impressions(s) / ED Diagnoses   Final diagnoses:  Nausea vomiting and diarrhea    ED Discharge Orders        Ordered    ondansetron (ZOFRAN) 4 MG tablet  Every 6 hours     03/12/17 1342       Raeford RazorKohut, Natascha Edmonds, MD 03/17/17 1345

## 2017-03-19 ENCOUNTER — Other Ambulatory Visit: Payer: Self-pay

## 2017-03-19 ENCOUNTER — Emergency Department (HOSPITAL_BASED_OUTPATIENT_CLINIC_OR_DEPARTMENT_OTHER)
Admission: EM | Admit: 2017-03-19 | Discharge: 2017-03-20 | Disposition: A | Discharge status: Home / Self Care | Payer: Medicaid Other | Attending: Emergency Medicine | Admitting: Emergency Medicine

## 2017-03-19 ENCOUNTER — Encounter (HOSPITAL_BASED_OUTPATIENT_CLINIC_OR_DEPARTMENT_OTHER): Payer: Self-pay

## 2017-03-19 DIAGNOSIS — F17210 Nicotine dependence, cigarettes, uncomplicated: Secondary | ICD-10-CM | POA: Insufficient documentation

## 2017-03-19 DIAGNOSIS — R519 Headache, unspecified: Secondary | ICD-10-CM

## 2017-03-19 DIAGNOSIS — Z79899 Other long term (current) drug therapy: Secondary | ICD-10-CM | POA: Insufficient documentation

## 2017-03-19 DIAGNOSIS — R51 Headache: Secondary | ICD-10-CM | POA: Diagnosis present

## 2017-03-19 LAB — PREGNANCY, URINE: PREG TEST UR: NEGATIVE

## 2017-03-19 MED ORDER — PROMETHAZINE HCL 25 MG/ML IJ SOLN
25.0000 mg | Freq: Once | INTRAMUSCULAR | Status: AC
  Administered 2017-03-19: 25 mg via INTRAVENOUS
  Filled 2017-03-19: qty 1

## 2017-03-19 MED ORDER — DIPHENHYDRAMINE HCL 50 MG/ML IJ SOLN: 25.0000 mg | Freq: Once | INTRAMUSCULAR | Status: DC

## 2017-03-19 MED ORDER — SODIUM CHLORIDE 0.9 % IV BOLUS (SEPSIS)
1000.0000 mL | Freq: Once | INTRAVENOUS | Status: AC
  Administered 2017-03-19: 1000 mL via INTRAVENOUS

## 2017-03-19 MED ORDER — ONDANSETRON HCL 4 MG/2ML IJ SOLN
4.0000 mg | Freq: Once | INTRAMUSCULAR | Status: AC | PRN
  Administered 2017-03-19: 4 mg via INTRAVENOUS
  Filled 2017-03-19: qty 2

## 2017-03-19 MED ORDER — KETOROLAC TROMETHAMINE 30 MG/ML IJ SOLN
30.0000 mg | Freq: Once | INTRAMUSCULAR | Status: AC
  Administered 2017-03-19: 30 mg via INTRAVENOUS
  Filled 2017-03-19: qty 1

## 2017-03-19 MED ORDER — METOCLOPRAMIDE HCL 5 MG/ML IJ SOLN: 10.0000 mg | Freq: Once | INTRAMUSCULAR | Status: DC

## 2017-03-19 NOTE — ED Notes (Signed)
Pt states nausea is better.  

## 2017-03-19 NOTE — ED Triage Notes (Signed)
C/o migraine x 2 days-relieved with excedrin yesterday but not today-NAD-steady gait

## 2017-03-19 NOTE — ED Notes (Signed)
Woke w ha yesterday  Pain increased w light and sound,  Also nausea  Vomited x 1 excedrin decreased ha yesterday  But no change today

## 2017-03-20 NOTE — ED Notes (Signed)
Coke given for PO challenge. 

## 2017-03-20 NOTE — ED Provider Notes (Signed)
[facility] EMERGENCY DEPARTMENT Provider Note   CSN: [id number] Arrival date & time: 03/19/17  1942     History   Chief Complaint Chief Complaint  Patient presents with  . Migraine    HPI Valerie [name] is a 27 y.o. female has medical history of migraine headaches, presenting to the ED with headache since yesterday.  Patient states her headaches usually resolve with Excedrin Migraine, however she did not have any left today.  She states headache feels like her typical headache, throbbing in nature with associated photophobia and nausea.  Denies vision changes, fever,  Neck pain or stiffness, or any other complaints.  The history is provided by the patient.    Past Medical History:  Diagnosis Date  . Abnormal Pap smear   . Eczema   . Gastric ulcer    2000  . Infection    UTI, chronic sinus inf  . Migraine   . Ovarian cyst   . UTI (urinary tract infection)     Patient Active Problem List   Diagnosis Date Noted  . Skin tag of perianal region 12/14/2013  . Depressive disorder, not elsewhere classified 12/14/2013  . Normal delivery 07/07/2013  . Active labor 07/06/2013  . Backache 07/05/2013  . BV (bacterial vaginosis) 12/24/2012  . Former smoker, stopped smoking in distant past 11/27/2012  . Supervision of normal first pregnancy 11/27/2012  . Family history of diabetes in pregnancy 11/27/2012  . Constipation 11/27/2012  . Nausea 11/27/2012  . History of facial weakness/Neuro workup/Lesion on MRI w/out change 11/27/2012    Past Surgical History:  Procedure Laterality Date  . ADENOIDECTOMY    . COLPOSCOPY    . MYRINGOTOMY WITH TUBE PLACEMENT      OB History    Gravida Para Term Preterm AB Living   3 1 1   2 1    SAB TAB Ectopic Multiple Live Births   2       1       Home Medications    Prior to Admission medications   Medication Sig Start Date End Date Taking? Authorizing Provider  acetaminophen (TYLENOL) 500 MG tablet Take 1,000 mg by mouth  every 6 (six) hours as needed for headache (headache). Reported on 04/04/2015    [provider]  busPIRone (BUSPAR) 7.5 MG tablet Take 7.5 mg by mouth 3 (three) times daily.    [provider]  etonogestrel-ethinyl estradiol (NUVARING) 0.12-0.015 MG/24HR vaginal ring Insert vaginally and leave in place for 3 consecutive weeks, then remove for 1 week. 08/21/16   Brock Bad, MD  ondansetron (ZOFRAN) 4 MG tablet Take 1 tablet (4 mg total) by mouth every 6 (six) hours. 03/12/17   Raeford Razor, MD    Family History Family History  Problem Relation Age of Onset  . Hypertension Mother   . Diabetes Father   . Cancer Paternal Aunt        female  . Diabetes Paternal Uncle   . Diabetes Paternal Grandmother     Social History Social History   Tobacco Use  . Smoking status: Current Every Day Smoker    Packs/day: 0.50    Types: Cigarettes  . Smokeless tobacco: Never Used  . Tobacco comment: Aug 2014  Substance Use Topics  . Alcohol use: Yes    Comment: Occasionally   . Drug use: No     Allergies   Augmentin [amoxicillin-pot clavulanate]; Hydrocodone; Macrobid [nitrofurantoin macrocrystal]; and Raspberry   Review of Systems Review of Systems  Constitutional: Negative for fever.  Eyes: Positive for photophobia. Negative for visual disturbance.  Gastrointestinal: Positive for nausea.  Musculoskeletal: Negative for neck pain and neck stiffness.  Neurological: Positive for headaches.  All other systems reviewed and are negative.    Physical Exam Updated Vital Signs BP 96/69 (BP Location: Right Arm)   Pulse 61   Temp 98.4 F (36.9 C) (Oral)   Resp 16   Ht 5\' 5"  (1.651 m)   Wt 91.2 kg (201 lb)   LMP  (LMP Unknown) Comment: change in BC  SpO2 98%   BMI 33.45 kg/m   Physical Exam  Constitutional: She is oriented to person, place, and time. She appears well-developed and well-nourished. No distress.  HENT:  Head: Normocephalic and atraumatic.  Eyes:  Conjunctivae and EOM are normal. Pupils are equal, round, and reactive to light.  Neck: Normal range of motion and full passive range of motion without pain. Neck supple. No neck rigidity.  Cardiovascular: Normal rate, regular rhythm, normal heart sounds and intact distal pulses.  Pulmonary/Chest: Effort normal and breath sounds normal. No respiratory distress.  Abdominal: Soft.  Neurological: She is alert and oriented to person, place, and time.  Mental Status:  Alert, oriented, thought content appropriate, able to give a coherent history. Speech fluent without evidence of aphasia. Able to follow 2 step commands without difficulty.  Cranial Nerves:  II:  Peripheral visual fields grossly normal, pupils equal, round, reactive to light III,IV, VI: ptosis not present, extra-ocular motions intact bilaterally  V,VII: smile symmetric, facial light touch sensation equal VIII: hearing grossly normal to voice  X: uvula elevates symmetrically  XI: bilateral shoulder shrug symmetric and strong XII: midline tongue extension without fassiculations Motor:  Normal tone. 5/5 in upper and lower extremities bilaterally including strong and equal grip strength and dorsiflexion/plantar flexion Sensory: Pinprick and light touch normal in all extremities.  Deep Tendon Reflexes: 2+ and symmetric in the biceps and patella Cerebellar: normal finger-to-nose with bilateral upper extremities Gait: normal gait and balance CV: distal pulses palpable throughout    Skin: Skin is warm.  Psychiatric: She has a normal mood and affect. Her behavior is normal.  Nursing note and vitals reviewed.    ED Treatments / Results  Labs (all labs ordered are listed, but only abnormal results are displayed) Labs Reviewed  PREGNANCY, URINE    EKG  EKG Interpretation None       Radiology No results found.  Procedures Procedures (including critical care time)  Medications Ordered in ED Medications  ondansetron  (ZOFRAN) injection 4 mg (4 mg Intravenous Given 03/19/17 2203)  ketorolac (TORADOL) 30 MG/ML injection 30 mg (30 mg Intravenous Given 03/19/17 2231)  sodium chloride 0.9 % bolus 1,000 mL (0 mLs Intravenous Stopped 03/19/17 2352)  promethazine (PHENERGAN) injection 25 mg (25 mg Intravenous Given 03/19/17 2231)     Initial Impression / Assessment and Plan / ED Course  I have reviewed the triage vital signs and the nursing notes.  Pertinent labs & imaging results that were available during my care of the patient were reviewed by me and considered in my medical decision making (see chart for details).     Pt w presentation is like pts typical HA and non concerning for [facility], ICH, Meningitis, or temporal arteritis.  Pt HA treated and improved while in ED. Pt is afebrile with no focal neuro deficits, nuchal rigidity, or change in vision. Tolerating PO prior to discharge. Pt is to follow up with PCP to discuss  prophylactic medication. Pt verbalizes understanding and is agreeable with plan to dc.   Discussed results, findings, treatment and follow up. Patient advised of return precautions. Patient verbalized understanding and agreed with plan.  Final Clinical Impressions(s) / ED Diagnoses   Final diagnoses:  Bad headache    ED Discharge Orders    None       Cumby, [place] N, PA-C 03/20/17 0034    Little, Ambrose Finlandachel Morgan, MD 03/21/17 1455

## 2017-03-20 NOTE — Discharge Instructions (Signed)
Please read instructions below. °You can take 600 mg of Advil/ibuprofen every 6 hours as needed for headache. °Schedule an appointment with your primary care provider to follow up on your headache and discuss preventative treatment. °Return to the ER for severely worsening headache, vision changes, fever, weakness or numbness, or new or concerning symptoms. ° °

## 2017-07-31 ENCOUNTER — Inpatient Hospital Stay (HOSPITAL_COMMUNITY)
Admission: AD | Admit: 2017-07-31 | Discharge: 2017-07-31 | Disposition: A | Discharge status: Home / Self Care | Payer: Medicaid Other | Source: Ambulatory Visit | Attending: Obstetrics and Gynecology | Admitting: Obstetrics and Gynecology

## 2017-07-31 ENCOUNTER — Other Ambulatory Visit: Payer: Self-pay

## 2017-07-31 ENCOUNTER — Encounter (HOSPITAL_COMMUNITY): Payer: Self-pay | Admitting: *Deleted

## 2017-07-31 DIAGNOSIS — O9989 Other specified diseases and conditions complicating pregnancy, childbirth and the puerperium: Secondary | ICD-10-CM | POA: Diagnosis not present

## 2017-07-31 DIAGNOSIS — F17210 Nicotine dependence, cigarettes, uncomplicated: Secondary | ICD-10-CM | POA: Diagnosis not present

## 2017-07-31 DIAGNOSIS — Z3A01 Less than 8 weeks gestation of pregnancy: Secondary | ICD-10-CM | POA: Diagnosis not present

## 2017-07-31 DIAGNOSIS — Z880 Allergy status to penicillin: Secondary | ICD-10-CM | POA: Insufficient documentation

## 2017-07-31 DIAGNOSIS — N83209 Unspecified ovarian cyst, unspecified side: Secondary | ICD-10-CM | POA: Insufficient documentation

## 2017-07-31 DIAGNOSIS — Z885 Allergy status to narcotic agent status: Secondary | ICD-10-CM | POA: Insufficient documentation

## 2017-07-31 DIAGNOSIS — R55 Syncope and collapse: Secondary | ICD-10-CM | POA: Diagnosis not present

## 2017-07-31 DIAGNOSIS — O99331 Smoking (tobacco) complicating pregnancy, first trimester: Secondary | ICD-10-CM | POA: Insufficient documentation

## 2017-07-31 DIAGNOSIS — O3481 Maternal care for other abnormalities of pelvic organs, first trimester: Secondary | ICD-10-CM | POA: Insufficient documentation

## 2017-07-31 DIAGNOSIS — Z888 Allergy status to other drugs, medicaments and biological substances status: Secondary | ICD-10-CM | POA: Diagnosis not present

## 2017-07-31 DIAGNOSIS — Z79899 Other long term (current) drug therapy: Secondary | ICD-10-CM | POA: Diagnosis not present

## 2017-07-31 DIAGNOSIS — Z881 Allergy status to other antibiotic agents status: Secondary | ICD-10-CM | POA: Diagnosis not present

## 2017-07-31 DIAGNOSIS — R002 Palpitations: Secondary | ICD-10-CM | POA: Diagnosis not present

## 2017-07-31 HISTORY — DX: Anemia, unspecified: D64.9

## 2017-07-31 LAB — COMPREHENSIVE METABOLIC PANEL
ALK PHOS: 114 U/L (ref 38–126)
ALT: 31 U/L (ref 14–54)
AST: 23 U/L (ref 15–41)
Albumin: 4 g/dL (ref 3.5–5.0)
Anion gap: 9 (ref 5–15)
BILIRUBIN TOTAL: 0.2 mg/dL — AB (ref 0.3–1.2)
BUN: 10 mg/dL (ref 6–20)
CALCIUM: 9.2 mg/dL (ref 8.9–10.3)
CHLORIDE: 110 mmol/L (ref 101–111)
CO2: 20 mmol/L — ABNORMAL LOW (ref 22–32)
Creatinine, Ser: 0.58 mg/dL (ref 0.44–1.00)
Glucose, Bld: 100 mg/dL — ABNORMAL HIGH (ref 65–99)
Potassium: 3.9 mmol/L (ref 3.5–5.1)
Sodium: 139 mmol/L (ref 135–145)
Total Protein: 6.9 g/dL (ref 6.5–8.1)

## 2017-07-31 LAB — URINALYSIS, ROUTINE W REFLEX MICROSCOPIC
Bilirubin Urine: NEGATIVE
Glucose, UA: NEGATIVE mg/dL
Hgb urine dipstick: NEGATIVE
Ketones, ur: NEGATIVE mg/dL
Nitrite: NEGATIVE
PH: 5 (ref 5.0–8.0)
Protein, ur: NEGATIVE mg/dL
SPECIFIC GRAVITY, URINE: 1.013 (ref 1.005–1.030)

## 2017-07-31 LAB — CBC
HEMATOCRIT: 36.7 % (ref 36.0–46.0)
Hemoglobin: 12.1 g/dL (ref 12.0–15.0)
MCH: 30.6 pg (ref 26.0–34.0)
MCHC: 33 g/dL (ref 30.0–36.0)
MCV: 92.7 fL (ref 78.0–100.0)
Platelets: 190 10*3/uL (ref 150–400)
RBC: 3.96 MIL/uL (ref 3.87–5.11)
RDW: 13.6 % (ref 11.5–15.5)
WBC: 8 10*3/uL (ref 4.0–10.5)

## 2017-07-31 LAB — POCT PREGNANCY, URINE
PREG TEST UR: NEGATIVE
Preg Test, Ur: POSITIVE — AB

## 2017-07-31 LAB — GLUCOSE, CAPILLARY: GLUCOSE-CAPILLARY: 98 mg/dL (ref 65–99)

## 2017-07-31 NOTE — MAU Provider Note (Signed)
History     CSN: [id number]  Arrival date and time: 07/31/17 1517   First Provider Initiated Contact with Patient 07/31/17 1553      Chief Complaint  Patient presents with  . Dizziness  . Nausea  . Hyperglycemia  . Possible Pregnancy   [id number]  here after syncope episode around 1430. She got dizzy, then tunnel vision and slid down the wall to the floor. She was at work and someone checked her BS and was found to be 318. She's been feeling palpitations for a few weeks. Her lunch around 1130 consisted of soda, oodles of noodles with cheese, and potato chips. No abdominal pain or VB. She is feeling better since arrival.   OB History    Gravida  4   Para  1   Term  1   Preterm      AB  2   Living  1     SAB  2   TAB      Ectopic      Multiple      Live Births  1           Past Medical History:  Diagnosis Date  . Abnormal Pap smear   . Anemia   . Eczema   . Gastric ulcer    2000  . Infection    UTI, chronic sinus inf  . Migraine   . Ovarian cyst   . UTI (urinary tract infection)   . Vaginal Pap smear, abnormal     Past Surgical History:  Procedure Laterality Date  . ADENOIDECTOMY    . COLPOSCOPY    . MYRINGOTOMY WITH TUBE PLACEMENT      Family History  Problem Relation Age of Onset  . Hypertension Mother   . Diabetes Father   . Hypertension Father   . Cancer Paternal Aunt        female  . Diabetes Paternal Uncle   . Diabetes Paternal Grandmother   . Hypertension Paternal Grandmother   . Diabetes Paternal Grandfather   . Hypertension Paternal Grandfather     Social History   Tobacco Use  . Smoking status: Current Every Day Smoker    Packs/day: 0.25    Types: Cigarettes  . Smokeless tobacco: Never Used  . Tobacco comment: cutting back  Substance Use Topics  . Alcohol use: Never    Frequency: Never  . Drug use: No    Allergies:  Allergies  Allergen Reactions  . Augmentin [Amoxicillin-Pot Clavulanate] Diarrhea and Nausea  And Vomiting       . Hydrocodone Hives  . Macrobid [Nitrofurantoin Macrocrystal] Hives  . Raspberry Swelling    Throat swelling    Facility-Administered Medications Prior to Admission  Medication Dose Route Frequency Provider Last Rate Last Dose  . Tdap (BOOSTRIX) injection 0.5 mL  0.5 mL Intramuscular Once Antionette Char, MD       Medications Prior to Admission  Medication Sig Dispense Refill Last Dose  . acetaminophen (TYLENOL) 500 MG tablet Take 1,000 mg by mouth every 6 (six) hours as needed for headache (headache). Reported on 04/04/2015   Taking  . busPIRone (BUSPAR) 7.5 MG tablet Take 7.5 mg by mouth 3 (three) times daily.   Taking  . etonogestrel-ethinyl estradiol (NUVARING) 0.12-0.015 MG/24HR vaginal ring Insert vaginally and leave in place for 3 consecutive weeks, then remove for 1 week. 1 each 12 Taking  . ondansetron (ZOFRAN) 4 MG tablet Take 1 tablet (4 mg total) by mouth every 6 (six)  hours. 12 tablet 0     Review of Systems  Constitutional: Negative for chills and fever.  Respiratory: Negative for chest tightness and shortness of breath.   Cardiovascular: Positive for palpitations. Negative for chest pain.  Gastrointestinal: Negative for abdominal pain.  Genitourinary: Negative for vaginal bleeding.  Neurological: Positive for dizziness and syncope.   Physical Exam   Blood pressure 121/76, pulse 75, temperature 98.9 F (37.2 C), temperature source Oral, resp. rate 18, last menstrual period 06/30/2017, SpO2 100 %.  Physical Exam  Nursing note and vitals reviewed. Constitutional: She is oriented to person, place, and time. She appears well-developed and well-nourished. No distress.  HENT:  Head: Normocephalic and atraumatic.  Eyes: Pupils are equal, round, and reactive to light. Conjunctivae and EOM are normal.  Neck: Normal range of motion.  Cardiovascular: Normal rate, regular rhythm and normal heart sounds.  Respiratory: Effort normal and breath sounds  normal. No respiratory distress. She has no wheezes. She has no rales.  GI: Soft. She exhibits no distension and no mass. There is no tenderness. There is no rebound and no guarding.  Musculoskeletal: Normal range of motion. She exhibits no edema.  Neurological: She is alert and oriented to person, place, and time.  Skin: Skin is warm and dry.  Psychiatric: She has a normal mood and affect.   Results for orders placed or performed during the hospital encounter of 07/31/17 (from the past 24 hour(s))  Glucose, capillary     Status: None   Collection Time: 07/31/17  3:25 PM  Result Value Ref Range   Glucose-Capillary 98 65 - 99 mg/dL  Urinalysis, Routine w reflex microscopic     Status: Abnormal   Collection Time: 07/31/17  3:36 PM  Result Value Ref Range   Color, Urine YELLOW YELLOW   APPearance CLOUDY (A) CLEAR   Specific Gravity, Urine 1.013 1.005 - 1.030   pH 5.0 5.0 - 8.0   Glucose, UA NEGATIVE NEGATIVE mg/dL   Hgb urine dipstick NEGATIVE NEGATIVE   Bilirubin Urine NEGATIVE NEGATIVE   Ketones, ur NEGATIVE NEGATIVE mg/dL   Protein, ur NEGATIVE NEGATIVE mg/dL   Nitrite NEGATIVE NEGATIVE   Leukocytes, UA LARGE (A) NEGATIVE   RBC / HPF 0-5 0 - 5 RBC/hpf   WBC, UA 11-20 0 - 5 WBC/hpf   Bacteria, UA FEW (A) NONE SEEN   Squamous Epithelial / LPF 21-50 0 - 5   WBC Clumps PRESENT    Mucus PRESENT   Pregnancy, urine POC     Status: None   Collection Time: 07/31/17  3:50 PM  Result Value Ref Range   Preg Test, Ur NEGATIVE NEGATIVE  CBC     Status: None   Collection Time: 07/31/17  3:56 PM  Result Value Ref Range   WBC 8.0 4.0 - 10.5 K/uL   RBC 3.96 3.87 - 5.11 MIL/uL   Hemoglobin 12.1 12.0 - 15.0 g/dL   HCT 46.9 62.9 - 52.8 %   MCV 92.7 78.0 - 100.0 fL   MCH 30.6 26.0 - 34.0 pg   MCHC 33.0 30.0 - 36.0 g/dL   RDW 41.3 24.4 - 01.0 %   Platelets 190 150 - 400 K/uL  Comprehensive metabolic panel     Status: Abnormal   Collection Time: 07/31/17  3:56 PM  Result Value Ref Range    Sodium 139 135 - 145 mmol/L   Potassium 3.9 3.5 - 5.1 mmol/L   Chloride 110 101 - 111 mmol/L   CO2 20 (L)  22 - 32 mmol/L   Glucose, Bld 100 (H) 65 - 99 mg/dL   BUN 10 6 - 20 mg/dL   Creatinine, Ser 4.69 0.44 - 1.00 mg/dL   Calcium 9.2 8.9 - 62.9 mg/dL   Total Protein 6.9 6.5 - 8.1 g/dL   Albumin 4.0 3.5 - 5.0 g/dL   AST 23 15 - 41 U/L   ALT 31 14 - 54 U/L   Alkaline Phosphatase 114 38 - 126 U/L   Total Bilirubin 0.2 (L) 0.3 - 1.2 mg/dL   GFR calc non Af Amer >60 >60 mL/min   GFR calc Af Amer >60 >60 mL/min   Anion gap 9 5 - 15  Pregnancy, urine POC     Status: Abnormal   Collection Time: 07/31/17  4:22 PM  Result Value Ref Range   Preg Test, Ur POSITIVE (A) NEGATIVE   MAU Course  Procedures  MDM Labs and EKG ordered. Consult with Dr. Jens Som -1st degree AV block but no concern for acute pathology. Syncope episode likely caused by BS fluctuation after high dietary carb intake. Stable for discharge home.  Assessment and Plan   1. [redacted] weeks gestation of pregnancy   2. Syncope, unspecified syncope type    Discharge home Follow up with [facility] if sx persist Follow up with [facility] to start OB care Return to MAU of OB emergencies  Allergies as of 07/31/2017      Reactions   Augmentin [amoxicillin-pot Clavulanate] Diarrhea, Nausea And Vomiting       Ciprofloxacin Hives   Doxycycline Hives   Hydrocodone Hives   Prednisone    Had a reaction to high doses and was told not to take again   Raspberry Swelling   Throat swelling      Medication List    STOP taking these medications   busPIRone 7.5 MG tablet Commonly known as:  BUSPAR   etonogestrel-ethinyl estradiol 0.12-0.015 MG/24HR vaginal ring Commonly known as:  NUVARING   ondansetron 4 MG tablet Commonly known as:  ZOFRAN     TAKE these medications   acetaminophen 500 MG tablet Commonly known as:  TYLENOL Take 1,000 mg by mouth every 6 (six) hours as needed for headache (headache). Reported on  04/04/2015   folic acid 1 MG tablet Commonly known as:  FOLVITE Take 1 mg by mouth daily.   hydrocortisone cream 1 % Apply 1 application topically 2 (two) times daily.      Donette Larry, CNM 07/31/2017, 4:39 PM

## 2017-07-31 NOTE — Discharge Instructions (Signed)
Syncope Syncope is when you lose temporarily pass out (faint). Signs that you may be about to pass out include:  Feeling dizzy or light-headed.  Feeling sick to your stomach (nauseous).  Seeing all white or all black.  Having cold, clammy skin.  If you passed out, get help right away. Call your local emergency services (911 in the [place] Do not drive yourself to the hospital. Follow these instructions at home: Pay attention to any changes in your symptoms. Take these actions to help with your condition:  Have someone stay with you until you feel stable.  Do not drive, use machinery, or play sports until your doctor says it is okay.  Keep all follow-up visits as told by your doctor. This is important.  If you start to feel like you might pass out, lie down right away and raise (elevate) your feet above the level of your heart. Breathe deeply and steadily. Wait until all of the symptoms are gone.  Drink enough fluid to keep your pee (urine) clear or pale yellow.  If you are taking blood pressure or heart medicine, get up slowly and spend many minutes getting ready to sit and then stand. This can help with dizziness.  Take over-the-counter and prescription medicines only as told by your doctor.  Get help right away if:  You have a very bad headache.  You have unusual pain in your chest, tummy, or back.  You are bleeding from your mouth or rectum.  You have black or tarry poop (stool).  You have a very fast or uneven heartbeat (palpitations).  It hurts to breathe.  You pass out once or more than once.  You have jerky movements that you cannot control (seizure).  You are confused.  You have trouble walking.  You are very weak.  You have vision problems. These symptoms may be an emergency. Do not wait to see if the symptoms will go away. Get medical help right away. Call your local emergency services (911 in the [place] Do not drive yourself to the hospital. This  information is not intended to replace advice given to you by your health care provider. Make sure you discuss any questions you have with your health care provider. Document Released: 08/21/2007 Document Revised: 08/10/2015 Document Reviewed: 11/16/2014 Elsevier Interactive Patient Education  2018 Elsevier Inc.  

## 2017-07-31 NOTE — MAU Note (Signed)
Was at school, became dizzy- things went dark  "went ahead and slid down the wall".  Teacher checked BP 130/80, p 84 "weak", blood sugar was 318.  Occurred about 1430/1440. Ate around 1130 (soda, oodles of noodles with cheese, potato chips).  Pt assisted to rm from car via wc. preg confirmed on Monday at urgent care.

## 2017-08-09 ENCOUNTER — Inpatient Hospital Stay (HOSPITAL_COMMUNITY): Payer: Medicaid Other

## 2017-08-09 ENCOUNTER — Encounter (HOSPITAL_COMMUNITY): Payer: Self-pay | Admitting: *Deleted

## 2017-08-09 ENCOUNTER — Inpatient Hospital Stay (HOSPITAL_COMMUNITY)
Admission: AD | Admit: 2017-08-09 | Discharge: 2017-08-09 | Disposition: A | Discharge status: Home / Self Care | Payer: Medicaid Other | Source: Ambulatory Visit | Attending: Obstetrics and Gynecology | Admitting: Obstetrics and Gynecology

## 2017-08-09 DIAGNOSIS — O209 Hemorrhage in early pregnancy, unspecified: Secondary | ICD-10-CM | POA: Diagnosis not present

## 2017-08-09 DIAGNOSIS — Z87891 Personal history of nicotine dependence: Secondary | ICD-10-CM | POA: Diagnosis not present

## 2017-08-09 DIAGNOSIS — Z3A01 Less than 8 weeks gestation of pregnancy: Secondary | ICD-10-CM | POA: Insufficient documentation

## 2017-08-09 DIAGNOSIS — R102 Pelvic and perineal pain: Secondary | ICD-10-CM | POA: Diagnosis present

## 2017-08-09 DIAGNOSIS — O26891 Other specified pregnancy related conditions, first trimester: Secondary | ICD-10-CM | POA: Insufficient documentation

## 2017-08-09 DIAGNOSIS — O26899 Other specified pregnancy related conditions, unspecified trimester: Secondary | ICD-10-CM

## 2017-08-09 LAB — CBC
HCT: 36 % (ref 36.0–46.0)
Hemoglobin: 11.7 g/dL — ABNORMAL LOW (ref 12.0–15.0)
MCH: 31 pg (ref 26.0–34.0)
MCHC: 32.5 g/dL (ref 30.0–36.0)
MCV: 95.2 fL (ref 78.0–100.0)
PLATELETS: 202 10*3/uL (ref 150–400)
RBC: 3.78 MIL/uL — ABNORMAL LOW (ref 3.87–5.11)
RDW: 14.1 % (ref 11.5–15.5)
WBC: 8 10*3/uL (ref 4.0–10.5)

## 2017-08-09 LAB — URINALYSIS, ROUTINE W REFLEX MICROSCOPIC
Bacteria, UA: NONE SEEN
Bilirubin Urine: NEGATIVE
GLUCOSE, UA: NEGATIVE mg/dL
Ketones, ur: NEGATIVE mg/dL
NITRITE: NEGATIVE
PH: 5 (ref 5.0–8.0)
Protein, ur: NEGATIVE mg/dL
Specific Gravity, Urine: 1.018 (ref 1.005–1.030)

## 2017-08-09 LAB — HCG, QUANTITATIVE, PREGNANCY: hCG, Beta Chain, Quant, S: 5317 m[IU]/mL — ABNORMAL HIGH (ref <5)

## 2017-08-09 LAB — WET PREP, GENITAL
CLUE CELLS WET PREP: NONE SEEN
SPERM: NONE SEEN
Trich, Wet Prep: NONE SEEN
Yeast Wet Prep HPF POC: NONE SEEN

## 2017-08-09 NOTE — Discharge Instructions (Signed)
First Trimester of Pregnancy The first trimester of pregnancy is from week 1 until the end of week 13 (months 1 through 3). During this time, your baby will begin to develop inside you. At 6-8 weeks, the eyes and face are formed, and the heartbeat can be seen on ultrasound. At the end of 12 weeks, all the baby's organs are formed. Prenatal care is all the medical care you receive before the birth of your baby. Make sure you get good prenatal care and follow all of your doctor's instructions. Follow these instructions at home: Medicines  Take over-the-counter and prescription medicines only as told by your doctor. Some medicines are safe and some medicines are not safe during pregnancy.  Take a prenatal vitamin that contains at least 600 micrograms (mcg) of folic acid.  If you have trouble pooping (constipation), take medicine that will make your stool soft (stool softener) if your doctor approves. Eating and drinking  Eat regular, healthy meals.  Your doctor will tell you the amount of weight gain that is right for you.  Avoid raw meat and uncooked cheese.  If you feel sick to your stomach (nauseous) or throw up (vomit): ? Eat 4 or 5 small meals a day instead of 3 large meals. ? Try eating a few soda crackers. ? Drink liquids between meals instead of during meals.  To prevent constipation: ? Eat foods that are high in fiber, like fresh fruits and vegetables, whole grains, and beans. ? Drink enough fluids to keep your pee (urine) clear or pale yellow. Activity  Exercise only as told by your doctor. Stop exercising if you have cramps or pain in your lower belly (abdomen) or low back.  Do not exercise if it is too hot, too humid, or if you are in a place of great height (high altitude).  Try to avoid standing for long periods of time. Move your legs often if you must stand in one place for a long time.  Avoid heavy lifting.  Wear low-heeled shoes. Sit and stand up straight.  You  can have sex unless your doctor tells you not to. Relieving pain and discomfort  Wear a good support bra if your breasts are sore.  Take warm water baths (sitz baths) to soothe pain or discomfort caused by hemorrhoids. Use hemorrhoid cream if your doctor says it is okay.  Rest with your legs raised if you have leg cramps or low back pain.  If you have puffy, bulging veins (varicose veins) in your legs: ? Wear support hose or compression stockings as told by your doctor. ? Raise (elevate) your feet for 15 minutes, 3-4 times a day. ? Limit salt in your food. Prenatal care  Schedule your prenatal visits by the twelfth week of pregnancy.  Write down your questions. Take them to your prenatal visits.  Keep all your prenatal visits as told by your doctor. This is important. Safety  Wear your seat belt at all times when driving.  Make a list of emergency phone numbers. The list should include numbers for family, friends, the hospital, and police and fire departments. General instructions  Ask your doctor for a referral to a local prenatal class. Begin classes no later than at the start of month 6 of your pregnancy.  Ask for help if you need counseling or if you need help with nutrition. Your doctor can give you advice or tell you where to go for help.  Do not use hot tubs, steam rooms, or   saunas.  Do not douche or use tampons or scented sanitary pads.  Do not cross your legs for long periods of time.  Avoid all herbs and alcohol. Avoid drugs that are not approved by your doctor.  Do not use any tobacco products, including cigarettes, chewing tobacco, and electronic cigarettes. If you need help quitting, ask your doctor. You may get counseling or other support to help you quit.  Avoid cat litter boxes and soil used by cats. These carry germs that can cause birth defects in the baby and can cause a loss of your baby (miscarriage) or stillbirth.  Visit your dentist. At home, brush  your teeth with a soft toothbrush. Be gentle when you floss. Contact a doctor if:  You are dizzy.  You have mild cramps or pressure in your lower belly.  You have a nagging pain in your belly area.  You continue to feel sick to your stomach, you throw up, or you have watery poop (diarrhea).  You have a bad smelling fluid coming from your vagina.  You have pain when you pee (urinate).  You have increased puffiness (swelling) in your face, hands, legs, or ankles. Get help right away if:  You have a fever.  You are leaking fluid from your vagina.  You have spotting or bleeding from your vagina.  You have very bad belly cramping or pain.  You gain or lose weight rapidly.  You throw up blood. It may look like coffee grounds.  You are around people who have German measles, fifth disease, or chickenpox.  You have a very bad headache.  You have shortness of breath.  You have any kind of trauma, such as from a fall or a car accident. Summary  The first trimester of pregnancy is from week 1 until the end of week 13 (months 1 through 3).  To take care of yourself and your unborn baby, you will need to eat healthy meals, take medicines only if your doctor tells you to do so, and do activities that are safe for you and your baby.  Keep all follow-up visits as told by your doctor. This is important as your doctor will have to ensure that your baby is healthy and growing well. This information is not intended to replace advice given to you by your health care provider. Make sure you discuss any questions you have with your health care provider. Document Released: 08/21/2007 Document Revised: 03/12/2016 Document Reviewed: 03/12/2016 Elsevier Interactive Patient Education  2017 Elsevier Inc.  

## 2017-08-09 NOTE — MAU Provider Note (Signed)
Chief Complaint: Abdominal Pain and Vaginal Bleeding   First Provider Initiated Contact with Patient 08/09/17 2004        SUBJECTIVE HPI: Valerie [name] is a 27 y.o. [id number] at [redacted]w[redacted]d by LMP who presents to maternity admissions reporting lower abdominal cramping since last night.  Localized to RLQ today with light pink spotting.  . She denies vaginal itching/burning, h/a, dizziness, n/v, or fever/chills.    Abdominal Pain  This is a new problem. The current episode started yesterday. The onset quality is gradual. The problem occurs intermittently. The problem has been unchanged. The pain is located in the suprapubic region, LLQ and RLQ. The quality of the pain is cramping. The abdominal pain does not radiate. Associated symptoms include dysuria. Pertinent negatives include no constipation, diarrhea, fever, headaches, myalgias, nausea or vomiting. Nothing aggravates the pain. The pain is relieved by nothing. She has tried nothing for the symptoms.  Vaginal Bleeding  The patient's primary symptoms include pelvic pain and vaginal bleeding. The patient's pertinent negatives include no genital itching, genital lesions or genital odor. This is a new problem. The current episode started yesterday. The problem occurs intermittently. The problem has been unchanged. The pain is mild. She is pregnant. Associated symptoms include abdominal pain and dysuria. Pertinent negatives include no constipation, diarrhea, fever, headaches, nausea or vomiting. The vaginal discharge was bloody (pink). The vaginal bleeding is spotting. She has not been passing clots. She has not been passing tissue. Nothing aggravates the symptoms. She has tried nothing for the symptoms.   Rn Note: Cramping in lower abd since last night. More cramping today, esp RLQ and light, pink spotting today. Some burning with urination    Past Medical History:  Diagnosis Date  . Abnormal Pap smear   . Anemia   . Eczema   . Gastric ulcer    2000   . Infection    UTI, chronic sinus inf  . Migraine   . Ovarian cyst   . UTI (urinary tract infection)   . Vaginal Pap smear, abnormal    Past Surgical History:  Procedure Laterality Date  . ADENOIDECTOMY    . COLPOSCOPY    . MYRINGOTOMY WITH TUBE PLACEMENT     Social History   Socioeconomic History  . Marital status: Legally Separated    Spouse name: Not on file  . Number of children: Not on file  . Years of education: Not on file  . Highest education level: Not on file  Occupational History  . Not on file  Social Needs  . Financial resource strain: Not on file  . Food insecurity:    Worry: Not on file    Inability: Not on file  . Transportation needs:    Medical: Not on file    Non-medical: Not on file  Tobacco Use  . Smoking status: Former Smoker    Packs/day: 0.25    Types: Cigarettes    Last attempt to quit: 08/05/2017    Years since quitting: 0.0  . Smokeless tobacco: Never Used  . Tobacco comment: cutting back  Substance and Sexual Activity  . Alcohol use: Never    Frequency: Never  . Drug use: No  . Sexual activity: Yes    Partners: Male    Birth control/protection: None  Lifestyle  . Physical activity:    Days per week: Not on file    Minutes per session: Not on file  . Stress: Not on file  Relationships  . Social connections:  Talks on phone: Not on file    Gets together: Not on file    Attends religious service: Not on file    Active member of club or organization: Not on file    Attends meetings of clubs or organizations: Not on file    Relationship status: Not on file  . Intimate partner violence:    Fear of current or ex partner: Not on file    Emotionally abused: Not on file    Physically abused: Not on file    Forced sexual activity: Not on file  Other Topics Concern  . Not on file  Social History Narrative  . Not on file   No current facility-administered medications on file prior to encounter.    Current Outpatient Medications  on File Prior to Encounter  Medication Sig Dispense Refill  . acetaminophen (TYLENOL) 500 MG tablet Take 1,000 mg by mouth every 6 (six) hours as needed for headache (headache). Reported on 04/04/2015    . folic acid (FOLVITE) 1 MG tablet Take 1 mg by mouth daily.    . hydrocortisone cream 1 % Apply 1 application topically 2 (two) times daily.     Allergies  Allergen Reactions  . Augmentin [Amoxicillin-Pot Clavulanate] Diarrhea and Nausea And Vomiting       . Ciprofloxacin Hives  . Doxycycline Hives  . Hydrocodone Hives  . Prednisone     Had a reaction to high doses and was told not to take again  . Raspberry Swelling    Throat swelling    I have reviewed patient's Past Medical Hx, Surgical Hx, Family Hx, Social Hx, medications and allergies.   ROS:  Review of Systems  Constitutional: Negative for fever.  Gastrointestinal: Positive for abdominal pain. Negative for constipation, diarrhea, nausea and vomiting.  Genitourinary: Positive for dysuria, pelvic pain and vaginal bleeding.  Musculoskeletal: Negative for myalgias.  Neurological: Negative for headaches.   Review of Systems  Other systems negative   Physical Exam  Physical Exam Patient Vitals for the past 24 hrs:  BP Temp Pulse Resp Height Weight  08/09/17 1923 115/84 98.5 F (36.9 C) 95 18  (1.651 m) 212 lb (96.2 kg)   Constitutional: Well-developed, well-nourished female in no acute distress.  Cardiovascular: normal rate Respiratory: normal effort GI: Abd soft, non-tender. Pos BS x 4 MS: Extremities nontender, no edema, normal ROM Neurologic: Alert and oriented x 4.  GU: Neg CVAT.  PELVIC EXAM: Cervix pink, visually closed, without lesion, scant pink creamy discharge, vaginal walls and external genitalia normal Cervix long and closed, no adnexal tenderness.   LAB RESULTS Results for orders placed or performed during the hospital encounter of 08/09/17 (from the past 24 hour(s))  Urinalysis, Routine w  reflex microscopic     Status: Abnormal   Collection Time: 08/09/17  7:30 PM  Result Value Ref Range   Color, Urine YELLOW YELLOW   APPearance CLEAR CLEAR   Specific Gravity, Urine 1.018 1.005 - 1.030   pH 5.0 5.0 - 8.0   Glucose, UA NEGATIVE NEGATIVE mg/dL   Hgb urine dipstick SMALL (A) NEGATIVE   Bilirubin Urine NEGATIVE NEGATIVE   Ketones, ur NEGATIVE NEGATIVE mg/dL   Protein, ur NEGATIVE NEGATIVE mg/dL   Nitrite NEGATIVE NEGATIVE   Leukocytes, UA TRACE (A) NEGATIVE   RBC / HPF 0-5 0 - 5 RBC/hpf   WBC, UA 0-5 0 - 5 WBC/hpf   Bacteria, UA NONE SEEN NONE SEEN   Squamous Epithelial / LPF 0-5 0 -  5   Mucus PRESENT   Wet prep, genital     Status: Abnormal   Collection Time: 08/09/17  8:17 PM  Result Value Ref Range   Yeast Wet Prep HPF POC NONE SEEN NONE SEEN   Trich, Wet Prep NONE SEEN NONE SEEN   Clue Cells Wet Prep HPF POC NONE SEEN NONE SEEN   WBC, Wet Prep HPF POC MANY (A) NONE SEEN   Sperm NONE SEEN   CBC     Status: Abnormal   Collection Time: 08/09/17  8:59 PM  Result Value Ref Range   WBC 8.0 4.0 - 10.5 K/uL   RBC 3.78 (L) 3.87 - 5.11 MIL/uL   Hemoglobin 11.7 (L) 12.0 - 15.0 g/dL   HCT 16.1 09.6 - 04.5 %   MCV 95.2 78.0 - 100.0 fL   MCH 31.0 26.0 - 34.0 pg   MCHC 32.5 30.0 - 36.0 g/dL   RDW 40.9 81.1 - 91.4 %   Platelets 202 150 - 400 K/uL  hCG, quantitative, pregnancy     Status: Abnormal   Collection Time: 08/09/17  8:59 PM  Result Value Ref Range   hCG, Beta Chain, Quant, S 5,317 (H) <5 mIU/mL       IMAGING US Ob Less Than 14 Weeks With Ob Transvaginal  Result Date: 08/09/2017 CLINICAL DATA:  Abdominal pain. Estimated gestational age by LMP is 5 weeks 5 days. Quantitative beta HCG is not recorded. EXAM: OBSTETRIC <14 WK [place] AND TRANSVAGINAL OB US TECHNIQUE: Both transabdominal and transvaginal ultrasound examinations were performed for complete evaluation of the gestation as well as the maternal uterus, adnexal regions, and pelvic cul-de-sac. Transvaginal  technique was performed to assess early pregnancy. COMPARISON:  None. FINDINGS: Intrauterine gestational sac: A single intrauterine gestational sac is visualized. Yolk sac:  A tiny yolk sac is identified. Embryo:  Not identified. Cardiac Activity: Not identified. MSD: 8.1 mm   5 w   4 d Subchorionic hemorrhage:  None visualized. Maternal uterus/adnexae: Uterus is anteverted. No myometrial mass lesions identified. Tiny nabothian cysts in the cervix. Both ovaries are visualized and appear normal. A small corpus luteal cyst is demonstrated on the left. No abnormal pelvic fluid. IMPRESSION: Probable early intrauterine gestational sac with a yolk sac, but no fetal pole or cardiac activity yet visualized. Recommend follow-up quantitative B-HCG levels and follow-up US in 14 days to assess viability. This recommendation follows SRU consensus guidelines: Diagnostic Criteria for Nonviable Pregnancy Early in the First Trimester. Malva Limes Med 2013; 782:9562-13. Electronically Signed   By: Burman Nieves M.D.   On: 08/09/2017 21:37     MAU Management/MDM: Ordered usual first trimester r/o ectopic labs.   Pelvic exam and cultures done Will check baseline Ultrasound to rule out ectopic. US showed single gestational sac with small yolk sac Discussed we will schedule an [place] next week with followup in clinic  This bleeding/pain can represent a normal pregnancy with bleeding, spontaneous abortion or even an ectopic which can be life-threatening.  The process as listed above helps to determine which of these is present.  Patient states she will not be going to [facility] because of her first degree heart block.   ASSESSMENT Single IUP at [redacted]w[redacted]d First trimester bleeding Pelvic pain in early pregnancy  PLAN Discharge home Will repeat  Ultrasound in about 7-10 days for progression of pregnancy Bleeding precautions.  Pt stable at time of discharge. Encouraged to return here or to other Urgent Care/ED if she develops  worsening of  symptoms, increase in pain, fever, or other concerning symptoms.    Wynelle Bourgeois CNM, MSN Certified Nurse-Midwife 08/09/2017  8:04 PM

## 2017-08-09 NOTE — MAU Note (Addendum)
Cramping in lower abd since last night. More cramping today, esp RLQ and light, pink spotting today. Some burning with urination

## 2017-08-10 LAB — HIV ANTIBODY (ROUTINE TESTING W REFLEX): HIV Screen 4th Generation wRfx: NONREACTIVE

## 2017-08-12 LAB — GC/CHLAMYDIA PROBE AMP (~~LOC~~) NOT AT ARMC
Chlamydia: NEGATIVE
NEISSERIA GONORRHEA: NEGATIVE

## 2017-08-18 ENCOUNTER — Ambulatory Visit (HOSPITAL_COMMUNITY)
Admission: RE | Admit: 2017-08-18 | Discharge: 2017-08-18 | Disposition: A | Discharge status: Home / Self Care | Payer: Medicaid Other | Source: Ambulatory Visit | Attending: Advanced Practice Midwife | Admitting: Advanced Practice Midwife

## 2017-08-18 ENCOUNTER — Ambulatory Visit (INDEPENDENT_AMBULATORY_CARE_PROVIDER_SITE_OTHER): Payer: Medicaid Other | Admitting: *Deleted

## 2017-08-18 ENCOUNTER — Encounter: Payer: Self-pay | Admitting: Family Medicine

## 2017-08-18 DIAGNOSIS — R102 Pelvic and perineal pain: Secondary | ICD-10-CM | POA: Insufficient documentation

## 2017-08-18 DIAGNOSIS — Z712 Person consulting for explanation of examination or test findings: Secondary | ICD-10-CM

## 2017-08-18 DIAGNOSIS — O26899 Other specified pregnancy related conditions, unspecified trimester: Secondary | ICD-10-CM

## 2017-08-18 DIAGNOSIS — O26891 Other specified pregnancy related conditions, first trimester: Secondary | ICD-10-CM | POA: Insufficient documentation

## 2017-08-18 DIAGNOSIS — Z3A01 Less than 8 weeks gestation of pregnancy: Secondary | ICD-10-CM | POA: Insufficient documentation

## 2017-08-18 MED ORDER — PROMETHAZINE HCL 25 MG PO TABS: 25.0000 mg | ORAL_TABLET | Freq: Four times a day (QID) | ORAL | 2 refills | Status: DC | PRN

## 2017-08-18 NOTE — Progress Notes (Signed)
I have reviewed the chart and agree with nursing staff's documentation of this patient's encounter.  Sabine Tenenbaum, CNM 08/18/2017 12:03 PM    

## 2017-08-18 NOTE — Progress Notes (Signed)
US results reviewed with Thressa ShellerHeather Hogan, CNM. Pt and mother informed of results showing normal pregnancy.  Pt reports having nausea which is not helped by Unisom/B6 and Zofran which she had @ home. Phenergan Rx sent to pharmacy. Pt voiced understanding of all information. She has prenatal care scheduled @ [facility].

## 2017-09-04 ENCOUNTER — Other Ambulatory Visit (HOSPITAL_COMMUNITY): Payer: Self-pay | Admitting: Family

## 2017-09-04 DIAGNOSIS — Z3A13 13 weeks gestation of pregnancy: Secondary | ICD-10-CM

## 2017-09-04 DIAGNOSIS — Z3682 Encounter for antenatal screening for nuchal translucency: Secondary | ICD-10-CM

## 2017-09-04 LAB — OB RESULTS CONSOLE RPR: RPR: NONREACTIVE

## 2017-09-04 LAB — OB RESULTS CONSOLE HEPATITIS B SURFACE ANTIGEN: Hepatitis B Surface Ag: NEGATIVE

## 2017-09-04 LAB — OB RESULTS CONSOLE VARICELLA ZOSTER ANTIBODY, IGG: VARICELLA IGG: IMMUNE

## 2017-09-04 LAB — OB RESULTS CONSOLE RUBELLA ANTIBODY, IGM: RUBELLA: IMMUNE

## 2017-09-05 ENCOUNTER — Encounter (HOSPITAL_COMMUNITY): Payer: Self-pay

## 2017-09-05 ENCOUNTER — Inpatient Hospital Stay (HOSPITAL_COMMUNITY)
Admission: AD | Admit: 2017-09-05 | Discharge: 2017-09-05 | Disposition: A | Discharge status: Home / Self Care | Payer: Medicaid Other | Source: Ambulatory Visit | Attending: Obstetrics & Gynecology | Admitting: Obstetrics & Gynecology

## 2017-09-05 DIAGNOSIS — A084 Viral intestinal infection, unspecified: Secondary | ICD-10-CM | POA: Diagnosis not present

## 2017-09-05 DIAGNOSIS — Z87891 Personal history of nicotine dependence: Secondary | ICD-10-CM | POA: Diagnosis not present

## 2017-09-05 DIAGNOSIS — R109 Unspecified abdominal pain: Secondary | ICD-10-CM

## 2017-09-05 DIAGNOSIS — Z8719 Personal history of other diseases of the digestive system: Secondary | ICD-10-CM | POA: Insufficient documentation

## 2017-09-05 DIAGNOSIS — O99281 Endocrine, nutritional and metabolic diseases complicating pregnancy, first trimester: Secondary | ICD-10-CM | POA: Insufficient documentation

## 2017-09-05 DIAGNOSIS — R112 Nausea with vomiting, unspecified: Secondary | ICD-10-CM | POA: Diagnosis present

## 2017-09-05 DIAGNOSIS — Z3A09 9 weeks gestation of pregnancy: Secondary | ICD-10-CM | POA: Diagnosis not present

## 2017-09-05 DIAGNOSIS — E860 Dehydration: Secondary | ICD-10-CM | POA: Insufficient documentation

## 2017-09-05 DIAGNOSIS — O26891 Other specified pregnancy related conditions, first trimester: Secondary | ICD-10-CM

## 2017-09-05 DIAGNOSIS — Z79899 Other long term (current) drug therapy: Secondary | ICD-10-CM | POA: Diagnosis not present

## 2017-09-05 LAB — COMPREHENSIVE METABOLIC PANEL
ALBUMIN: 3.9 g/dL (ref 3.5–5.0)
ALT: 21 U/L (ref 14–54)
ANION GAP: 10 (ref 5–15)
AST: 18 U/L (ref 15–41)
Alkaline Phosphatase: 89 U/L (ref 38–126)
BILIRUBIN TOTAL: 0.4 mg/dL (ref 0.3–1.2)
BUN: 9 mg/dL (ref 6–20)
CHLORIDE: 105 mmol/L (ref 101–111)
CO2: 19 mmol/L — AB (ref 22–32)
Calcium: 9.3 mg/dL (ref 8.9–10.3)
Creatinine, Ser: 0.44 mg/dL (ref 0.44–1.00)
GFR calc Af Amer: >60 mL/min (ref >60)
GFR calc non Af Amer: >60 mL/min (ref >60)
GLUCOSE: 88 mg/dL (ref 65–99)
POTASSIUM: 3.5 mmol/L (ref 3.5–5.1)
SODIUM: 134 mmol/L — AB (ref 135–145)
Total Protein: 7.1 g/dL (ref 6.5–8.1)

## 2017-09-05 LAB — CBC
HEMATOCRIT: 35.1 % — AB (ref 36.0–46.0)
Hemoglobin: 11.8 g/dL — ABNORMAL LOW (ref 12.0–15.0)
MCH: 30.8 pg (ref 26.0–34.0)
MCHC: 33.6 g/dL (ref 30.0–36.0)
MCV: 91.6 fL (ref 78.0–100.0)
PLATELETS: 210 10*3/uL (ref 150–400)
RBC: 3.83 MIL/uL — ABNORMAL LOW (ref 3.87–5.11)
RDW: 13.5 % (ref 11.5–15.5)
WBC: 8.1 10*3/uL (ref 4.0–10.5)

## 2017-09-05 LAB — LIPASE, BLOOD: Lipase: 23 U/L (ref 11–51)

## 2017-09-05 LAB — URINALYSIS, ROUTINE W REFLEX MICROSCOPIC
Bilirubin Urine: NEGATIVE
Glucose, UA: NEGATIVE mg/dL
HGB URINE DIPSTICK: NEGATIVE
KETONES UR: NEGATIVE mg/dL
NITRITE: NEGATIVE
PROTEIN: NEGATIVE mg/dL
Specific Gravity, Urine: 1.029 (ref 1.005–1.030)
pH: 5 (ref 5.0–8.0)

## 2017-09-05 MED ORDER — METOCLOPRAMIDE HCL 10 MG PO TABS: 10.0000 mg | ORAL_TABLET | Freq: Four times a day (QID) | ORAL | 0 refills | Status: DC | PRN

## 2017-09-05 MED ORDER — LACTATED RINGERS IV BOLUS
1000.0000 mL | Freq: Once | INTRAVENOUS | Status: AC
  Administered 2017-09-05: 1000 mL via INTRAVENOUS

## 2017-09-05 MED ORDER — PROMETHAZINE HCL 25 MG/ML IJ SOLN
25.0000 mg | Freq: Once | INTRAMUSCULAR | Status: AC
  Administered 2017-09-05: 25 mg via INTRAVENOUS
  Filled 2017-09-05: qty 1

## 2017-09-05 MED ORDER — METOCLOPRAMIDE HCL 5 MG/ML IJ SOLN
10.0000 mg | Freq: Once | INTRAMUSCULAR | Status: AC
  Administered 2017-09-05: 10 mg via INTRAVENOUS
  Filled 2017-09-05: qty 2

## 2017-09-05 NOTE — Discharge Instructions (Signed)
Viral Gastroenteritis, Adult Viral gastroenteritis is also known as the stomach flu. This condition is caused by certain germs (viruses). These germs can be passed from person to person very easily (are very contagious). This condition can cause sudden watery poop (diarrhea), fever, and throwing up (vomiting). Having watery poop and throwing up can make you feel weak and cause you to get dehydrated. Dehydration can make you tired and thirsty, make you have a dry mouth, and make it so you pee (urinate) less often. Older adults and people with other diseases or a weak defense system (immune system) are at higher risk for dehydration. It is important to replace the fluids that you lose from having watery poop and throwing up. Follow these instructions at home: Follow instructions from your doctor about how to care for yourself at home. Eating and drinking  Follow these instructions as told by your doctor:  Take an oral rehydration solution (ORS). This is a drink that is sold at pharmacies and stores.  Drink clear fluids in small amounts as you are able, such as: ? Water. ? Ice chips. ? Diluted fruit juice. ? Low-calorie sports drinks.  Eat bland, easy-to-digest foods in small amounts as you are able, such as: ? Bananas. ? Applesauce. ? Rice. ? Low-fat (lean) meats. ? Toast. ? Crackers.  Avoid fluids that have a lot of sugar or caffeine in them.  Avoid alcohol.  Avoid spicy or fatty foods.  General instructions  Drink enough fluid to keep your pee (urine) clear or pale yellow.  Wash your hands often. If you cannot use soap and water, use hand sanitizer.  Make sure that all people in your home wash their hands well and often.  Rest at home while you get better.  Take over-the-counter and prescription medicines only as told by your doctor.  Watch your condition for any changes.  Take a warm bath to help with any burning or pain from having watery poop.  Keep all follow-up  visits as told by your doctor. This is important. Contact a doctor if:  You cannot keep fluids down.  Your symptoms get worse.  You have new symptoms.  You feel light-headed or dizzy.  You have muscle cramps. Get help right away if:  You have chest pain.  You feel very weak or you pass out (faint).  You see blood in your throw-up.  Your throw-up looks like coffee grounds.  You have bloody or black poop (stools) or poop that look like tar.  You have a very bad headache, a stiff neck, or both.  You have a rash.  You have very bad pain, cramping, or bloating in your belly (abdomen).  You have trouble breathing.  You are breathing very quickly.  Your heart is beating very quickly.  Your skin feels cold and clammy.  You feel confused.  You have pain when you pee.  You have signs of dehydration, such as: ? Dark pee, hardly any pee, or no pee. ? Cracked lips. ? Dry mouth. ? Sunken eyes. ? Sleepiness. ? Weakness. This information is not intended to replace advice given to you by your health care provider. Make sure you discuss any questions you have with your health care provider. Document Released: 08/21/2007 Document Revised: 09/22/2015 Document Reviewed: 11/08/2014 Elsevier Interactive Patient Education  2017 Elsevier Inc. Bland Diet A bland diet consists of foods that do not have a lot of fat or fiber. Foods without fat or fiber are easier for the body to   digest. They are also less likely to irritate your mouth, throat, stomach, and other parts of your gastrointestinal tract. A bland diet is sometimes called a BRAT diet. What is my plan? Your health care provider or dietitian may recommend specific changes to your diet to prevent and treat your symptoms, such as:  Eating small meals often.  Cooking food until it is soft enough to chew easily.  Chewing your food well.  Drinking fluids slowly.  Not eating foods that are very spicy, sour, or fatty.  Not  eating citrus fruits, such as oranges and grapefruit.  What do I need to know about this diet?  Eat a variety of foods from the bland diet food list.  Do not follow a bland diet longer than you have to.  Ask your health care provider whether you should take vitamins. What foods can I eat? Grains  Hot cereals, such as cream of wheat. Bread, crackers, or tortillas made from refined white flour. Rice. Vegetables Canned or cooked vegetables. Mashed or boiled potatoes. Fruits Bananas. Applesauce. Other types of cooked or canned fruit with the skin and seeds removed, such as canned peaches or pears. Meats and Other Protein Sources Scrambled eggs. Creamy peanut butter or other nut butters. Lean, well-cooked meats, such as chicken or fish. Tofu. Soups or broths. Dairy Low-fat dairy products, such as milk, cottage cheese, or yogurt. Beverages Water. Herbal tea. Apple juice. Sweets and Desserts Pudding. Custard. Fruit gelatin. Ice cream. Fats and Oils Mild salad dressings. Canola or olive oil. The items listed above may not be a complete list of allowed foods or beverages. Contact your dietitian for more options. What foods are not recommended? Foods and ingredients that are often not recommended include:  Spicy foods, such as hot sauce or salsa.  Fried foods.  Sour foods, such as pickled or fermented foods.  Raw vegetables or fruits, especially citrus or berries.  Caffeinated drinks.  Alcohol.  Strongly flavored seasonings or condiments.  The items listed above may not be a complete list of foods and beverages that are not allowed. Contact your dietitian for more information. This information is not intended to replace advice given to you by your health care provider. Make sure you discuss any questions you have with your health care provider. Document Released: 06/26/2015 Document Revised: 08/10/2015 Document Reviewed: 03/16/2014 Elsevier Interactive Patient Education  2018  Elsevier Inc.  

## 2017-09-05 NOTE — MAU Provider Note (Signed)
History     CSN: [id number]  Arrival date and time: 09/05/17 1302  Chief Complaint  Patient presents with  . Emesis  . Abdominal Pain  . Dizziness   G4P1021 @9 .4 wks here with N/V since last night. No diarrhea. No fevers. Recent sick contacts. No VB. Having some upper abdominal pain.   OB History    Gravida  4   Para  1   Term  1   Preterm      AB  2   Living  1     SAB  2   TAB      Ectopic      Multiple      Live Births  1           Past Medical History:  Diagnosis Date  . Abnormal Pap smear   . Anemia   . Eczema   . Gastric ulcer    2000  . Infection    UTI, chronic sinus inf  . Migraine   . Ovarian cyst   . UTI (urinary tract infection)   . Vaginal Pap smear, abnormal     Past Surgical History:  Procedure Laterality Date  . ADENOIDECTOMY    . COLPOSCOPY    . MYRINGOTOMY WITH TUBE PLACEMENT      Family History  Problem Relation Age of Onset  . Hypertension Mother   . Diabetes Father   . Hypertension Father   . Cancer Paternal Aunt        female  . Diabetes Paternal Uncle   . Diabetes Paternal Grandmother   . Hypertension Paternal Grandmother   . Diabetes Paternal Grandfather   . Hypertension Paternal Grandfather     Social History   Tobacco Use  . Smoking status: Former Smoker    Packs/day: 0.25    Types: Cigarettes    Last attempt to quit: 08/05/2017    Years since quitting: 0.0  . Smokeless tobacco: Never Used  . Tobacco comment: cutting back  Substance Use Topics  . Alcohol use: Never    Frequency: Never  . Drug use: No    Allergies:  Allergies  Allergen Reactions  . Augmentin [Amoxicillin-Pot Clavulanate] Diarrhea and Nausea And Vomiting       . Ciprofloxacin Hives  . Doxycycline Hives  . Hydrocodone Hives  . Prednisone     Had a reaction to high doses and was told not to take again  . Raspberry Swelling    Throat swelling    Medications Prior to Admission  Medication Sig Dispense Refill Last Dose  .  acetaminophen (TYLENOL) 500 MG tablet Take 1,000 mg by mouth every 6 (six) hours as needed for headache (headache). Reported on 04/04/2015   Past Week at Unknown time  . docusate sodium (COLACE) 100 MG capsule Take 100 mg by mouth daily as needed for mild constipation.   09/04/2017 at Unknown time  . Glycerin, Laxative, (GLYCERIN ADULT RE) Place 1 suppository rectally daily as needed (constipation).   09/04/2017 at Unknown time  . hydrocortisone cream 1 % Apply 1 application topically 2 (two) times daily.   Past Month at Unknown time  . Prenatal Vit-Fe Fumarate-FA (PRENATAL MULTIVITAMIN) TABS tablet Take 1 tablet by mouth daily at 12 noon.   09/04/2017 at Unknown time  . promethazine (PHENERGAN) 25 MG tablet Take 1 tablet (25 mg total) by mouth every 6 (six) hours as needed for nausea or vomiting. (Patient taking differently: Take 12.5 mg by mouth every 5 (five)  hours as needed for nausea or vomiting. ) 30 tablet 2 09/05/2017 at Unknown time   Review of Systems  Constitutional: Negative for chills and fever.  Gastrointestinal: Positive for abdominal pain, constipation, nausea and vomiting. Negative for diarrhea.  Genitourinary: Negative for dysuria and vaginal bleeding.   Physical Exam   Blood pressure 118/79, pulse 81, temperature 98.4 F (36.9 C), temperature source Oral, resp. rate 18, height 5\' 5"  (1.651 m), weight 209 lb (94.8 kg), last menstrual period 06/30/2017.  Physical Exam  Constitutional: She is oriented to person, place, and time. She appears well-developed and well-nourished. No distress.  HENT:  Head: Normocephalic and atraumatic.  Neck: Normal range of motion.  Cardiovascular: Normal rate.  Respiratory: Effort normal. No respiratory distress.  GI: Soft. She exhibits no distension and no mass. There is tenderness in the right upper quadrant. There is no rebound and no guarding.  Musculoskeletal: Normal range of motion.  Neurological: She is alert and oriented to person, place,  and time.  Skin: Skin is warm and dry.  Psychiatric: She has a normal mood and affect.   Results for orders placed or performed during the hospital encounter of 09/05/17 (from the past 24 hour(s))  Urinalysis, Routine w reflex microscopic     Status: Abnormal   Collection Time: 09/05/17  1:36 PM  Result Value Ref Range   Color, Urine YELLOW YELLOW   APPearance HAZY (A) CLEAR   Specific Gravity, Urine 1.029 1.005 - 1.030   pH 5.0 5.0 - 8.0   Glucose, UA NEGATIVE NEGATIVE mg/dL   Hgb urine dipstick NEGATIVE NEGATIVE   Bilirubin Urine NEGATIVE NEGATIVE   Ketones, ur NEGATIVE NEGATIVE mg/dL   Protein, ur NEGATIVE NEGATIVE mg/dL   Nitrite NEGATIVE NEGATIVE   Leukocytes, UA LARGE (A) NEGATIVE   RBC / HPF 0-5 0 - 5 RBC/hpf   WBC, UA 11-20 0 - 5 WBC/hpf   Bacteria, UA RARE (A) NONE SEEN   Squamous Epithelial / LPF 11-20 0 - 5   Mucus PRESENT   CBC     Status: Abnormal   Collection Time: 09/05/17  4:35 PM  Result Value Ref Range   WBC 8.1 4.0 - 10.5 K/uL   RBC 3.83 (L) 3.87 - 5.11 MIL/uL   Hemoglobin 11.8 (L) 12.0 - 15.0 g/dL   HCT 16.1 (L) 09.6 - 04.5 %   MCV 91.6 78.0 - 100.0 fL   MCH 30.8 26.0 - 34.0 pg   MCHC 33.6 30.0 - 36.0 g/dL   RDW 40.9 81.1 - 91.4 %   Platelets 210 150 - 400 K/uL  Comprehensive metabolic panel     Status: Abnormal   Collection Time: 09/05/17  4:35 PM  Result Value Ref Range   Sodium 134 (L) 135 - 145 mmol/L   Potassium 3.5 3.5 - 5.1 mmol/L   Chloride 105 101 - 111 mmol/L   CO2 19 (L) 22 - 32 mmol/L   Glucose, Bld 88 65 - 99 mg/dL   BUN 9 6 - 20 mg/dL   Creatinine, Ser 7.82 0.44 - 1.00 mg/dL   Calcium 9.3 8.9 - 95.6 mg/dL   Total Protein 7.1 6.5 - 8.1 g/dL   Albumin 3.9 3.5 - 5.0 g/dL   AST 18 15 - 41 U/L   ALT 21 14 - 54 U/L   Alkaline Phosphatase 89 38 - 126 U/L   Total Bilirubin 0.4 0.3 - 1.2 mg/dL   GFR calc non Af Amer >60 >60 mL/min   GFR calc  Af Amer >60 >60 mL/min   Anion gap 10 5 - 15  Lipase, blood     Status: None   Collection Time:  09/05/17  4:35 PM  Result Value Ref Range   Lipase 23 11 - 51 U/L   MAU Course  Procedures LR Phenergan Reglan  MDM Labs ordered and reviewed. No improvement after Phenergan. Feeling better after Reglan. No emesis. Tolerating po. Stable for discharge home.  Assessment and Plan   1. [redacted] weeks gestation of pregnancy   2. Viral gastroenteritis   3. Dehydration    Discharge home Follow up in OB office as scheduled Rx Reglan Hydrate BRAT diet  Allergies as of 09/05/2017      Reactions   Augmentin [amoxicillin-pot Clavulanate] Diarrhea, Nausea And Vomiting       Ciprofloxacin Hives   Doxycycline Hives   Hydrocodone Hives   Prednisone    Had a reaction to high doses and was told not to take again   Raspberry Swelling   Throat swelling      Medication List    STOP taking these medications   promethazine 25 MG tablet Commonly known as:  PHENERGAN     TAKE these medications   acetaminophen 500 MG tablet Commonly known as:  TYLENOL Take 1,000 mg by mouth every 6 (six) hours as needed for headache (headache). Reported on 04/04/2015   docusate sodium 100 MG capsule Commonly known as:  COLACE Take 100 mg by mouth daily as needed for mild constipation.   GLYCERIN ADULT RE Place 1 suppository rectally daily as needed (constipation).   hydrocortisone cream 1 % Apply 1 application topically 2 (two) times daily.   metoCLOPramide 10 MG tablet Commonly known as:  REGLAN Take 1 tablet (10 mg total) by mouth every 6 (six) hours as needed for nausea.   prenatal multivitamin Tabs tablet Take 1 tablet by mouth daily at 12 noon.      Donette LarryMelanie Gerry Blanchfield, CNM 09/05/2017, 7:13 PM

## 2017-09-05 NOTE — MAU Note (Signed)
Pt C/O vomiting since last night, can't keep water down.  Also feeling dizzy & mid abdominal cramping.  Has had one loose stool, not diarrhea.  Is in nursing school, two other people sick as well.  Denies bleeding.

## 2017-09-15 ENCOUNTER — Other Ambulatory Visit (HOSPITAL_COMMUNITY): Payer: Self-pay | Admitting: Family

## 2017-09-15 DIAGNOSIS — O3680X0 Pregnancy with inconclusive fetal viability, not applicable or unspecified: Secondary | ICD-10-CM

## 2017-09-17 ENCOUNTER — Other Ambulatory Visit: Payer: Self-pay

## 2017-09-17 ENCOUNTER — Inpatient Hospital Stay (HOSPITAL_COMMUNITY)
Admission: AD | Admit: 2017-09-17 | Discharge: 2017-09-17 | Disposition: A | Discharge status: Home / Self Care | Payer: Medicaid Other | Source: Ambulatory Visit | Attending: Obstetrics and Gynecology | Admitting: Obstetrics and Gynecology

## 2017-09-17 ENCOUNTER — Inpatient Hospital Stay (HOSPITAL_COMMUNITY): Payer: Medicaid Other

## 2017-09-17 ENCOUNTER — Encounter (HOSPITAL_COMMUNITY): Payer: Self-pay | Admitting: *Deleted

## 2017-09-17 DIAGNOSIS — Z881 Allergy status to other antibiotic agents status: Secondary | ICD-10-CM | POA: Diagnosis not present

## 2017-09-17 DIAGNOSIS — O99351 Diseases of the nervous system complicating pregnancy, first trimester: Secondary | ICD-10-CM | POA: Insufficient documentation

## 2017-09-17 DIAGNOSIS — G43909 Migraine, unspecified, not intractable, without status migrainosus: Secondary | ICD-10-CM | POA: Insufficient documentation

## 2017-09-17 DIAGNOSIS — Z3A11 11 weeks gestation of pregnancy: Secondary | ICD-10-CM | POA: Diagnosis not present

## 2017-09-17 DIAGNOSIS — Z79899 Other long term (current) drug therapy: Secondary | ICD-10-CM | POA: Diagnosis not present

## 2017-09-17 DIAGNOSIS — G44201 Tension-type headache, unspecified, intractable: Secondary | ICD-10-CM | POA: Diagnosis not present

## 2017-09-17 DIAGNOSIS — Z880 Allergy status to penicillin: Secondary | ICD-10-CM | POA: Diagnosis not present

## 2017-09-17 DIAGNOSIS — Z9889 Other specified postprocedural states: Secondary | ICD-10-CM | POA: Diagnosis not present

## 2017-09-17 DIAGNOSIS — Z87891 Personal history of nicotine dependence: Secondary | ICD-10-CM | POA: Insufficient documentation

## 2017-09-17 DIAGNOSIS — Z885 Allergy status to narcotic agent status: Secondary | ICD-10-CM | POA: Insufficient documentation

## 2017-09-17 DIAGNOSIS — Z888 Allergy status to other drugs, medicaments and biological substances status: Secondary | ICD-10-CM | POA: Diagnosis not present

## 2017-09-17 DIAGNOSIS — Z91018 Allergy to other foods: Secondary | ICD-10-CM | POA: Diagnosis not present

## 2017-09-17 DIAGNOSIS — O368390 Maternal care for abnormalities of the fetal heart rate or rhythm, unspecified trimester, not applicable or unspecified: Secondary | ICD-10-CM

## 2017-09-17 DIAGNOSIS — O219 Vomiting of pregnancy, unspecified: Secondary | ICD-10-CM

## 2017-09-17 LAB — URINALYSIS, ROUTINE W REFLEX MICROSCOPIC
Bacteria, UA: NONE SEEN
Bilirubin Urine: NEGATIVE
Glucose, UA: NEGATIVE mg/dL
Hgb urine dipstick: NEGATIVE
Ketones, ur: 80 mg/dL — AB
Nitrite: NEGATIVE
Protein, ur: NEGATIVE mg/dL
Specific Gravity, Urine: 1.026 (ref 1.005–1.030)
pH: 5 (ref 5.0–8.0)

## 2017-09-17 LAB — CBC
HCT: 36.7 % (ref 36.0–46.0)
Hemoglobin: 12.4 g/dL (ref 12.0–15.0)
MCH: 30.9 pg (ref 26.0–34.0)
MCHC: 33.8 g/dL (ref 30.0–36.0)
MCV: 91.5 fL (ref 78.0–100.0)
Platelets: 212 10*3/uL (ref 150–400)
RBC: 4.01 MIL/uL (ref 3.87–5.11)
RDW: 13 % (ref 11.5–15.5)
WBC: 8.2 10*3/uL (ref 4.0–10.5)

## 2017-09-17 MED ORDER — METOCLOPRAMIDE HCL 5 MG/ML IJ SOLN
10.0000 mg | Freq: Once | INTRAMUSCULAR | Status: AC
  Administered 2017-09-17: 10 mg via INTRAVENOUS
  Filled 2017-09-17: qty 2

## 2017-09-17 MED ORDER — DIPHENHYDRAMINE HCL 50 MG/ML IJ SOLN
25.0000 mg | Freq: Once | INTRAMUSCULAR | Status: AC
  Administered 2017-09-17: 25 mg via INTRAVENOUS
  Filled 2017-09-17: qty 1

## 2017-09-17 MED ORDER — DEXAMETHASONE SODIUM PHOSPHATE 10 MG/ML IJ SOLN
10.0000 mg | Freq: Once | INTRAMUSCULAR | Status: AC
  Administered 2017-09-17: 10 mg via INTRAVENOUS
  Filled 2017-09-17: qty 1

## 2017-09-17 MED ORDER — M.V.I. ADULT IV INJ
Freq: Once | INTRAVENOUS | Status: AC
  Administered 2017-09-17: 20:00:00 via INTRAVENOUS
  Filled 2017-09-17: qty 10

## 2017-09-17 MED ORDER — PROMETHAZINE HCL 25 MG/ML IJ SOLN
12.5000 mg | Freq: Once | INTRAMUSCULAR | Status: AC
  Administered 2017-09-17: 12.5 mg via INTRAVENOUS
  Filled 2017-09-17: qty 1

## 2017-09-17 MED ORDER — DEXTROSE 5 % IN LACTATED RINGERS IV BOLUS
1000.0000 mL | Freq: Once | INTRAVENOUS | Status: AC
  Administered 2017-09-17: 1000 mL via INTRAVENOUS

## 2017-09-17 NOTE — MAU Provider Note (Signed)
Chief Complaint: Migraine   First Provider Initiated Contact with Patient 09/17/17 1747      SUBJECTIVE HPI: Valerie [name] is a 27 y.o. [id number] at 754w2d by LMP who presents to maternity admissions reporting headache. She reports headache has been present since yesterday. Reports headache as being in the lower aspect of the posterior head. She rates pain 9/10- has taken Tylenol 1,000mg  for headache with no relief. She reports nausea and vomiting is associated with headache. She reports she has been vomiting for the past 3 days. She is prescribed reglan for N/V but has not taken medication this week. She denies abdominal pain, cramping, vaginal bleeding, vaginal itching/burning, urinary symptoms,  or fever/chills.    Past Medical History:  Diagnosis Date  . Abnormal Pap smear   . Anemia   . Eczema   . Gastric ulcer    2000  . Infection    UTI, chronic sinus inf  . Migraine   . Ovarian cyst   . UTI (urinary tract infection)   . Vaginal Pap smear, abnormal    Past Surgical History:  Procedure Laterality Date  . ADENOIDECTOMY    . COLPOSCOPY    . CRYOTHERAPY    . MYRINGOTOMY WITH TUBE PLACEMENT     Social History   Socioeconomic History  . Marital status: Legally Separated    Spouse name: Not on file  . Number of children: Not on file  . Years of education: Not on file  . Highest education level: Not on file  Occupational History  . Not on file  Social Needs  . Financial resource strain: Not on file  . Food insecurity:    Worry: Not on file    Inability: Not on file  . Transportation needs:    Medical: Not on file    Non-medical: Not on file  Tobacco Use  . Smoking status: Former Smoker    Packs/day: 0.25    Types: Cigarettes    Last attempt to quit: 08/05/2017    Years since quitting: 0.1  . Smokeless tobacco: Never Used  . Tobacco comment: cutting back  Substance and Sexual Activity  . Alcohol use: Never    Frequency: Never  . Drug use: No  . Sexual activity:  Yes    Partners: Male    Birth control/protection: None  Lifestyle  . Physical activity:    Days per week: Not on file    Minutes per session: Not on file  . Stress: Not on file  Relationships  . Social connections:    Talks on phone: Not on file    Gets together: Not on file    Attends religious service: Not on file    Active member of club or organization: Not on file    Attends meetings of clubs or organizations: Not on file    Relationship status: Not on file  . Intimate partner violence:    Fear of current or ex partner: Not on file    Emotionally abused: Not on file    Physically abused: Not on file    Forced sexual activity: Not on file  Other Topics Concern  . Not on file  Social History Narrative  . Not on file   No current facility-administered medications on file prior to encounter.    Current Outpatient Medications on File Prior to Encounter  Medication Sig Dispense Refill  . acetaminophen (TYLENOL) 500 MG tablet Take 1,000 mg by mouth every 6 (six) hours as needed for headache (  headache). Reported on 04/04/2015    . docusate sodium (COLACE) 100 MG capsule Take 100 mg by mouth daily as needed for mild constipation.    . Glycerin, Laxative, (GLYCERIN ADULT RE) Place 1 suppository rectally daily as needed (constipation).    . hydrocortisone cream 1 % Apply 1 application topically 2 (two) times daily.    . metoCLOPramide (REGLAN) 10 MG tablet Take 1 tablet (10 mg total) by mouth every 6 (six) hours as needed for nausea. 20 tablet 0  . Prenatal Vit-Fe Fumarate-FA (PRENATAL MULTIVITAMIN) TABS tablet Take 1 tablet by mouth daily at 12 noon.     Allergies  Allergen Reactions  . Augmentin [Amoxicillin-Pot Clavulanate] Diarrhea and Nausea And Vomiting       . Ciprofloxacin Hives  . Doxycycline Hives  . Hydrocodone Hives  . Prednisone     Had a reaction to high doses and was told not to take again  . Raspberry Swelling    Throat swelling    ROS:  Review of Systems   Respiratory: Negative.   Cardiovascular: Negative.   Gastrointestinal: Positive for nausea and vomiting. Negative for abdominal pain, constipation and diarrhea.  Genitourinary: Negative.   Musculoskeletal: Negative.   Neurological: Positive for headaches. Negative for dizziness, weakness and light-headedness.   I have reviewed patient's Past Medical Hx, Surgical Hx, Family Hx, Social Hx, medications and allergies.   Physical Exam   Vitals:   09/17/17 1710 09/17/17 2115  BP: 126/77 119/67  Pulse: 85 80  Resp: 16 16  Temp: 98.6 F (37 C)   TempSrc: Oral   SpO2: 98%   Weight: 202 lb 4 oz (91.7 kg)    Constitutional: Well-developed, well-nourished female in no acute distress.  Cardiovascular: normal rate Respiratory: normal effort GI: Abd soft, non-tender. Pos BS x 4 MS: Extremities nontender, no edema, normal ROM Neurologic: Alert and oriented x 4.  PELVIC EXAM: deferred  Unable to obtain FHT by doppler- official US ordered.   LAB RESULTS Results for orders placed or performed during the hospital encounter of 09/17/17 (from the past 24 hour(s))  Urinalysis, Routine w reflex microscopic     Status: Abnormal   Collection Time: 09/17/17  5:22 PM  Result Value Ref Range   Color, Urine YELLOW YELLOW   APPearance HAZY (A) CLEAR   Specific Gravity, Urine 1.026 1.005 - 1.030   pH 5.0 5.0 - 8.0   Glucose, UA NEGATIVE NEGATIVE mg/dL   Hgb urine dipstick NEGATIVE NEGATIVE   Bilirubin Urine NEGATIVE NEGATIVE   Ketones, ur 80 (A) NEGATIVE mg/dL   Protein, ur NEGATIVE NEGATIVE mg/dL   Nitrite NEGATIVE NEGATIVE   Leukocytes, UA MODERATE (A) NEGATIVE   RBC / HPF 0-5 0 - 5 RBC/hpf   WBC, UA 11-20 0 - 5 WBC/hpf   Bacteria, UA NONE SEEN NONE SEEN   Squamous Epithelial / LPF 6-10 0 - 5   Mucus PRESENT   CBC     Status: None   Collection Time: 09/17/17  6:24 PM  Result Value Ref Range   WBC 8.2 4.0 - 10.5 K/uL   RBC 4.01 3.87 - 5.11 MIL/uL   Hemoglobin 12.4 12.0 - 15.0 g/dL   HCT  14.7 82.9 - 56.2 %   MCV 91.5 78.0 - 100.0 fL   MCH 30.9 26.0 - 34.0 pg   MCHC 33.8 30.0 - 36.0 g/dL   RDW 13.0 86.5 - 78.4 %   Platelets 212 150 - 400 K/uL    IMAGING US Ob  Comp Less 14 Wks  Result Date: 09/17/2017 CLINICAL DATA:  No fetal heart tones EXAM: OBSTETRIC <14 WK ULTRASOUND TECHNIQUE: Transabdominal ultrasound was performed for evaluation of the gestation as well as the maternal uterus and adnexal regions. COMPARISON:  None. FINDINGS: Intrauterine gestational sac: Single Yolk sac:  Not visualized Embryo:  Visualized Cardiac Activity: Visualized Heart Rate: 170 bpm MSD:   mm    w     d CRL:   43.7 mm   11 w 1 d                  [place] EDC: 04/07/2018 Subchorionic hemorrhage:  None visualized. Maternal uterus/adnexae: No adnexal mass or free fluid. IMPRESSION: Eleven week 1 day intrauterine pregnancy. Fetal heart rate 170 beats per minute. No acute maternal findings. Electronically Signed   By: Charlett Nose M.D.   On: 09/17/2017 19:11    MAU Management/MDM: Orders Placed This Encounter  Procedures  . US OB Comp Less 14 Wks  . Urinalysis, Routine w reflex microscopic  . CBC  . Insert peripheral IV  . Discharge patient Discharge disposition: 01-Home or Self Care; Discharge patient date: 09/17/2017   CBC- WNL  UA- showed large amounts of ketones, IV fluids initiated   Meds ordered this encounter  Medications  . AND Linked Order Group   . diphenhydrAMINE (BENADRYL) injection 25 mg   . metoCLOPramide (REGLAN) injection 10 mg  . dextrose 5% lactated ringers bolus 1,000 mL  . dexamethasone (DECADRON) injection 10 mg  . multivitamins adult (MVI -12) 10 mL in dextrose 5% lactated ringers 1,000 mL infusion  . promethazine (PHENERGAN) injection 12.5 mg   Treatments in MAU included Headache cocktail that included reglan and benadryl, patient told RN she is allergic to prednisone- decadron not ordered. Patient reports headache has decreased to 5/10. Further discussion in detail on allergy  to prednisone- patient reports allergy was when she was a child and can not take specifically prednisone in large qualities but can take other steroids. Decadron and phenergan ordered. Patient reports relief of headache prior to discharge home. Multivitamin bag IV given prior to discharge home due to ketones and reports of N/V. Pt discharged. Pt stable at time of discharge.  [place] reviewed with patient and noted IUP with FHR of 170 measuring [redacted]w[redacted]d- EDD kept the same due to 3 day difference in LMP   ASSESSMENT 1. Acute intractable tension-type headache   2. Ultrasound scan done for inability to hear fetal heart tones   3. [redacted] weeks gestation of pregnancy   4. Nausea and vomiting during pregnancy     PLAN Discharge home Follow up as scheduled for prenatal appointments  Return to MAU as needed  Continue medication as prescribed  Continue taking PNV and reglan  Call provider's office if unable to keep down reglan and can be prescribe additional medication- patient verbalizes understanding   Follow-up Information    Department, [facility] Follow up.   Why:  Follow up as scheduled for prenatal appointments and return to MAU as needed Contact information: [address] [place] [address] [phone]           Allergies as of 09/17/2017      Reactions   Augmentin [amoxicillin-pot Clavulanate] Diarrhea, Nausea And Vomiting       Ciprofloxacin Hives   Doxycycline Hives   Hydrocodone Hives   Prednisone    Had a reaction to high doses and was told not to take again   [place]  Swelling   Throat swelling      Medication List    TAKE these medications   acetaminophen 500 MG tablet Commonly known as:  TYLENOL Take 1,000 mg by mouth every 6 (six) hours as needed for headache (headache). Reported on 04/04/2015   docusate sodium 100 MG capsule Commonly known as:  COLACE Take 100 mg by mouth daily as needed for mild constipation.   GLYCERIN ADULT RE Place 1  suppository rectally daily as needed (constipation).   hydrocortisone cream 1 % Apply 1 application topically 2 (two) times daily.   metoCLOPramide 10 MG tablet Commonly known as:  REGLAN Take 1 tablet (10 mg total) by mouth every 6 (six) hours as needed for nausea.   prenatal multivitamin Tabs tablet Take 1 tablet by mouth daily at 12 noon.      Steward Drone  Certified Nurse-Midwife 09/19/2017  11:03 AM

## 2017-09-17 NOTE — MAU Note (Signed)
Urine in lab 

## 2017-09-17 NOTE — MAU Note (Signed)
Had a migraine since yesterday.  Vomited all day yesterday. Only thrown up twice, but HA condition.  Has a bump behind right ear, showed to provider at visit- thought abscess- was told they don't drain them, it has doubled in size.

## 2017-09-19 ENCOUNTER — Encounter (HOSPITAL_COMMUNITY): Payer: Self-pay

## 2017-09-19 ENCOUNTER — Ambulatory Visit (HOSPITAL_COMMUNITY)
Admission: RE | Admit: 2017-09-19 | Discharge: 2017-09-19 | Disposition: A | Discharge status: Home / Self Care | Payer: Medicaid Other | Source: Ambulatory Visit | Attending: Family | Admitting: Family

## 2017-09-19 ENCOUNTER — Inpatient Hospital Stay (HOSPITAL_COMMUNITY)
Admission: AD | Admit: 2017-09-19 | Discharge: 2017-09-19 | Disposition: A | Discharge status: Home / Self Care | Payer: Medicaid Other | Source: Ambulatory Visit | Attending: Family Medicine | Admitting: Family Medicine

## 2017-09-19 DIAGNOSIS — Z880 Allergy status to penicillin: Secondary | ICD-10-CM | POA: Diagnosis not present

## 2017-09-19 DIAGNOSIS — O3481 Maternal care for other abnormalities of pelvic organs, first trimester: Secondary | ICD-10-CM | POA: Insufficient documentation

## 2017-09-19 DIAGNOSIS — Z888 Allergy status to other drugs, medicaments and biological substances status: Secondary | ICD-10-CM | POA: Insufficient documentation

## 2017-09-19 DIAGNOSIS — I440 Atrioventricular block, first degree: Secondary | ICD-10-CM | POA: Diagnosis not present

## 2017-09-19 DIAGNOSIS — R0602 Shortness of breath: Secondary | ICD-10-CM | POA: Diagnosis present

## 2017-09-19 DIAGNOSIS — R002 Palpitations: Secondary | ICD-10-CM | POA: Diagnosis not present

## 2017-09-19 DIAGNOSIS — O26891 Other specified pregnancy related conditions, first trimester: Secondary | ICD-10-CM | POA: Diagnosis not present

## 2017-09-19 DIAGNOSIS — Z3A11 11 weeks gestation of pregnancy: Secondary | ICD-10-CM | POA: Diagnosis not present

## 2017-09-19 DIAGNOSIS — Z79899 Other long term (current) drug therapy: Secondary | ICD-10-CM | POA: Diagnosis not present

## 2017-09-19 DIAGNOSIS — O358XX0 Maternal care for other (suspected) fetal abnormality and damage, not applicable or unspecified: Secondary | ICD-10-CM

## 2017-09-19 DIAGNOSIS — N83209 Unspecified ovarian cyst, unspecified side: Secondary | ICD-10-CM | POA: Insufficient documentation

## 2017-09-19 DIAGNOSIS — O35BXX0 Maternal care for other (suspected) fetal abnormality and damage, fetal cardiac anomalies, not applicable or unspecified: Secondary | ICD-10-CM

## 2017-09-19 DIAGNOSIS — Z881 Allergy status to other antibiotic agents status: Secondary | ICD-10-CM | POA: Insufficient documentation

## 2017-09-19 DIAGNOSIS — Z87891 Personal history of nicotine dependence: Secondary | ICD-10-CM | POA: Insufficient documentation

## 2017-09-19 DIAGNOSIS — Z885 Allergy status to narcotic agent status: Secondary | ICD-10-CM | POA: Insufficient documentation

## 2017-09-19 DIAGNOSIS — O3680X0 Pregnancy with inconclusive fetal viability, not applicable or unspecified: Secondary | ICD-10-CM

## 2017-09-19 LAB — CBC WITH DIFFERENTIAL/PLATELET
Basophils Absolute: 0 10*3/uL (ref 0.0–0.1)
Basophils Relative: 0 %
EOS PCT: 1 %
Eosinophils Absolute: 0.1 10*3/uL (ref 0.0–0.7)
HEMATOCRIT: 33.8 % — AB (ref 36.0–46.0)
Hemoglobin: 11.2 g/dL — ABNORMAL LOW (ref 12.0–15.0)
LYMPHS PCT: 39 %
Lymphs Abs: 2.9 10*3/uL (ref 0.7–4.0)
MCH: 30.8 pg (ref 26.0–34.0)
MCHC: 33.1 g/dL (ref 30.0–36.0)
MCV: 92.9 fL (ref 78.0–100.0)
MONO ABS: 0.3 10*3/uL (ref 0.1–1.0)
MONOS PCT: 5 %
Neutro Abs: 4.1 10*3/uL (ref 1.7–7.7)
Neutrophils Relative %: 55 %
Platelets: 193 10*3/uL (ref 150–400)
RBC: 3.64 MIL/uL — ABNORMAL LOW (ref 3.87–5.11)
RDW: 13.3 % (ref 11.5–15.5)
WBC: 7.4 10*3/uL (ref 4.0–10.5)

## 2017-09-19 LAB — URINALYSIS, ROUTINE W REFLEX MICROSCOPIC
Bilirubin Urine: NEGATIVE
GLUCOSE, UA: NEGATIVE mg/dL
HGB URINE DIPSTICK: NEGATIVE
KETONES UR: NEGATIVE mg/dL
NITRITE: NEGATIVE
PH: 5 (ref 5.0–8.0)
Protein, ur: NEGATIVE mg/dL
Specific Gravity, Urine: 1.024 (ref 1.005–1.030)

## 2017-09-19 LAB — COMPREHENSIVE METABOLIC PANEL
ALT: 12 U/L (ref 0–44)
ANION GAP: 8 (ref 5–15)
AST: 16 U/L (ref 15–41)
Albumin: 3.3 g/dL — ABNORMAL LOW (ref 3.5–5.0)
Alkaline Phosphatase: 74 U/L (ref 38–126)
BILIRUBIN TOTAL: 0.1 mg/dL — AB (ref 0.3–1.2)
BUN: 9 mg/dL (ref 6–20)
CO2: 20 mmol/L — AB (ref 22–32)
CREATININE: 0.55 mg/dL (ref 0.44–1.00)
Calcium: 9.1 mg/dL (ref 8.9–10.3)
Chloride: 108 mmol/L (ref 98–111)
GLUCOSE: 92 mg/dL (ref 70–99)
Potassium: 3.6 mmol/L (ref 3.5–5.1)
Sodium: 136 mmol/L (ref 135–145)
Total Protein: 6.2 g/dL — ABNORMAL LOW (ref 6.5–8.1)

## 2017-09-19 NOTE — MAU Provider Note (Signed)
History     CSN: [id number]  Arrival date and time: 09/19/17 1900   First Provider Initiated Contact with Patient 09/19/17 2002      Chief Complaint  Patient presents with  . Shortness of Breath  . Palpitations  . Tachycardia   Valerie [name] is a 27 y.o. [phone] at 2485w4d who presents today with "weird cardiac stuff". She states that this has been going on since before she knew she was pregnant. She was seen here at one point and dx with first degree heart block. She states that she feels like she needs to take "gulps" of air. She is getting prenatal care the health department, and is awaiting referral for cardiolgy. She has an appointment with a therapist because she doesn't feel like she can bond with this baby.   07/31/17 EKG with 1st degree heart block, cardiology reviewed at that time and reported no concern for acute pathology at that time.   Shortness of Breath  This is a new problem. The current episode started more than 1 month ago. The problem occurs intermittently. The problem has been unchanged. Associated symptoms include vomiting. Pertinent negatives include no fever. She has tried nothing for the symptoms.  Palpitations   This is a new problem. The current episode started more than 1 month ago. The problem occurs intermittently. The problem has been unchanged. Associated symptoms include nausea, shortness of breath and vomiting. Pertinent negatives include no coughing or fever. She has tried nothing for the symptoms.    Past Medical History:  Diagnosis Date  . Abnormal Pap smear   . Anemia   . Eczema   . Gastric ulcer    2000  . Infection    UTI, chronic sinus inf  . Migraine   . Ovarian cyst   . UTI (urinary tract infection)   . Vaginal Pap smear, abnormal     Past Surgical History:  Procedure Laterality Date  . ADENOIDECTOMY    . COLPOSCOPY    . CRYOTHERAPY    . MYRINGOTOMY WITH TUBE PLACEMENT      Family History  Problem Relation Age of Onset  .  Hypertension Mother   . Diabetes Father   . Hypertension Father   . Cancer Paternal Aunt        female  . Diabetes Paternal Uncle   . Diabetes Paternal Grandmother   . Hypertension Paternal Grandmother   . Diabetes Paternal Grandfather   . Hypertension Paternal Grandfather     Social History   Tobacco Use  . Smoking status: Former Smoker    Packs/day: 0.25    Types: Cigarettes    Last attempt to quit: 08/05/2017    Years since quitting: 0.1  . Smokeless tobacco: Never Used  . Tobacco comment: cutting back  Substance Use Topics  . Alcohol use: Never    Frequency: Never  . Drug use: No    Allergies:  Allergies  Allergen Reactions  . Augmentin [Amoxicillin-Pot Clavulanate] Diarrhea and Nausea And Vomiting       . Ciprofloxacin Hives  . Doxycycline Hives  . Hydrocodone Hives  . Prednisone     Had a reaction to high doses and was told not to take again  . Raspberry Swelling    Throat swelling    Medications Prior to Admission  Medication Sig Dispense Refill Last Dose  . acetaminophen (TYLENOL) 500 MG tablet Take 1,000 mg by mouth every 6 (six) hours as needed for headache (headache). Reported on 04/04/2015  Past Week at Unknown time  . docusate sodium (COLACE) 100 MG capsule Take 100 mg by mouth daily as needed for mild constipation.   Past Month at Unknown time  . hydrocortisone cream 1 % Apply 1 application topically 2 (two) times daily.   Past Week at Unknown time  . metoCLOPramide (REGLAN) 10 MG tablet Take 1 tablet (10 mg total) by mouth every 6 (six) hours as needed for nausea. 20 tablet 0 Past Month at Unknown time  . Prenatal Vit-Fe Fumarate-FA (PRENATAL MULTIVITAMIN) TABS tablet Take 1 tablet by mouth daily at 12 noon.   09/19/2017 at Unknown time  . promethazine (PHENERGAN) 12.5 MG tablet Take 12.5 mg by mouth every 6 (six) hours as needed for nausea or vomiting.   09/18/2017 at Unknown time  . Glycerin, Laxative, (GLYCERIN ADULT RE) Place 1 suppository rectally  daily as needed (constipation).   More than a month at Unknown time    Review of Systems  Constitutional: Negative for chills and fever.  Respiratory: Positive for shortness of breath. Negative for cough.   Cardiovascular: Positive for palpitations.  Gastrointestinal: Positive for nausea and vomiting.   Physical Exam   Blood pressure 119/84, pulse 77, temperature 97.8 F (36.6 C), temperature source Oral, resp. rate 16, height 5\' 5"  (1.651 m), weight 208 lb (94.3 kg), last menstrual period 06/30/2017, SpO2 100 %.  Physical Exam  Nursing note and vitals reviewed. Constitutional: She is oriented to person, place, and time. She appears well-developed and well-nourished. No distress.  HENT:  Head: Normocephalic.  Cardiovascular: Normal rate.  Respiratory: Effort normal.  GI: Soft. There is no tenderness. There is no rebound.  Neurological: She is alert and oriented to person, place, and time.  Skin: Skin is warm and dry.  Psychiatric: She has a normal mood and affect.     Results for orders placed or performed during the hospital encounter of 09/19/17 (from the past 24 hour(s))  Urinalysis, Routine w reflex microscopic     Status: Abnormal   Collection Time: 09/19/17  7:27 PM  Result Value Ref Range   Color, Urine YELLOW YELLOW   APPearance CLOUDY (A) CLEAR   Specific Gravity, Urine 1.024 1.005 - 1.030   pH 5.0 5.0 - 8.0   Glucose, UA NEGATIVE NEGATIVE mg/dL   Hgb urine dipstick NEGATIVE NEGATIVE   Bilirubin Urine NEGATIVE NEGATIVE   Ketones, ur NEGATIVE NEGATIVE mg/dL   Protein, ur NEGATIVE NEGATIVE mg/dL   Nitrite NEGATIVE NEGATIVE   Leukocytes, UA LARGE (A) NEGATIVE   RBC / HPF 6-10 0 - 5 RBC/hpf   WBC, UA 11-20 0 - 5 WBC/hpf   Bacteria, UA RARE (A) NONE SEEN   Squamous Epithelial / LPF 11-20 0 - 5   Mucus PRESENT   CBC with Differential/Platelet     Status: Abnormal   Collection Time: 09/19/17  8:24 PM  Result Value Ref Range   WBC 7.4 4.0 - 10.5 K/uL   RBC 3.64 (L)  3.87 - 5.11 MIL/uL   Hemoglobin 11.2 (L) 12.0 - 15.0 g/dL   HCT 21.3 (L) 08.6 - 57.8 %   MCV 92.9 78.0 - 100.0 fL   MCH 30.8 26.0 - 34.0 pg   MCHC 33.1 30.0 - 36.0 g/dL   RDW 46.9 62.9 - 52.8 %   Platelets 193 150 - 400 K/uL   Neutrophils Relative % 55 %   Neutro Abs 4.1 1.7 - 7.7 K/uL   Lymphocytes Relative 39 %   Lymphs Abs 2.9  0.7 - 4.0 K/uL   Monocytes Relative 5 %   Monocytes Absolute 0.3 0.1 - 1.0 K/uL   Eosinophils Relative 1 %   Eosinophils Absolute 0.1 0.0 - 0.7 K/uL   Basophils Relative 0 %   Basophils Absolute 0.0 0.0 - 0.1 K/uL  Comprehensive metabolic panel     Status: Abnormal   Collection Time: 09/19/17  8:24 PM  Result Value Ref Range   Sodium 136 135 - 145 mmol/L   Potassium 3.6 3.5 - 5.1 mmol/L   Chloride 108 98 - 111 mmol/L   CO2 20 (L) 22 - 32 mmol/L   Glucose, Bld 92 70 - 99 mg/dL   BUN 9 6 - 20 mg/dL   Creatinine, Ser 1.61 0.44 - 1.00 mg/dL   Calcium 9.1 8.9 - 09.6 mg/dL   Total Protein 6.2 (L) 6.5 - 8.1 g/dL   Albumin 3.3 (L) 3.5 - 5.0 g/dL   AST 16 15 - 41 U/L   ALT 12 0 - 44 U/L   Alkaline Phosphatase 74 38 - 126 U/L   Total Bilirubin 0.1 (L) 0.3 - 1.2 mg/dL   GFR calc non Af Amer >60 >60 mL/min   GFR calc Af Amer >60 >60 mL/min   Anion gap 8 5 - 15     MAU Course  Procedures  MDM 9:34 PM Consult with Dr. Shawnie Pons. Reviewed current complaint and past work up. Patient is mostly concerned with getting cardiology appt sooner rather than later. Will put in referral to cardiology and they should call her with appt.     Assessment and Plan   1. Heart block, fetal, affecting care of mother   2. [redacted] weeks gestation of pregnancy    DC home Comfort measures reviewed  2nd Trimester precautions  RX: no new RX. Ambulatory referral to cardiology order placed Return to MAU as needed FU with OB as planned  Follow-up Information    [facility] Follow up.   Contact information: [address] [place]  [address] [phone]           Thressa Sheller 09/19/2017, 8:04 PM

## 2017-09-19 NOTE — MAU Note (Addendum)
Today my heart rate has been all over the place. 140s at time. When that happens I get dizzy and cannot catch my breath. I feel swollen. Sometimes my chest feels tight when I am having sob. Being treated with Metrogel for BV currently

## 2017-09-19 NOTE — Discharge Instructions (Signed)
First Trimester of Pregnancy The first trimester of pregnancy is from week 1 until the end of week 13 (months 1 through 3). A week after a sperm fertilizes an egg, the egg will implant on the wall of the uterus. This embryo will begin to develop into a baby. Genes from you and your partner will form the baby. The female genes will determine whether the baby will be a boy or a girl. At 6-8 weeks, the eyes and face will be formed, and the heartbeat can be seen on ultrasound. At the end of 12 weeks, all the baby's organs will be formed. Now that you are pregnant, you will want to do everything you can to have a healthy baby. Two of the most important things are to get good prenatal care and to follow your health care provider's instructions. Prenatal care is all the medical care you receive before the baby's birth. This care will help prevent, find, and treat any problems during the pregnancy and childbirth. Body changes during your first trimester Your body goes through many changes during pregnancy. The changes vary from woman to woman.  You may gain or lose a couple of pounds at first.  You may feel sick to your stomach (nauseous) and you may throw up (vomit). If the vomiting is uncontrollable, call your health care provider.  You may tire easily.  You may develop headaches that can be relieved by medicines. All medicines should be approved by your health care provider.  You may urinate more often. Painful urination may mean you have a bladder infection.  You may develop heartburn as a result of your pregnancy.  You may develop constipation because certain hormones are causing the muscles that push stool through your intestines to slow down.  You may develop hemorrhoids or swollen veins (varicose veins).  Your breasts may begin to grow larger and become tender. Your nipples may stick out more, and the tissue that surrounds them (areola) may become darker.  Your gums may bleed and may be  sensitive to brushing and flossing.  Dark spots or blotches (chloasma, mask of pregnancy) may develop on your face. This will likely fade after the baby is born.  Your menstrual periods will stop.  You may have a loss of appetite.  You may develop cravings for certain kinds of food.  You may have changes in your emotions from day to day, such as being excited to be pregnant or being concerned that something may go wrong with the pregnancy and baby.  You may have more vivid and strange dreams.  You may have changes in your hair. These can include thickening of your hair, rapid growth, and changes in texture. Some women also have hair loss during or after pregnancy, or hair that feels dry or thin. Your hair will most likely return to normal after your baby is born.  What to expect at prenatal visits During a routine prenatal visit:  You will be weighed to make sure you and the baby are growing normally.  Your blood pressure will be taken.  Your abdomen will be measured to track your baby's growth.  The fetal heartbeat will be listened to between weeks 10 and 14 of your pregnancy.  Test results from any previous visits will be discussed.  Your health care provider may ask you:  How you are feeling.  If you are feeling the baby move.  If you have had any abnormal symptoms, such as leaking fluid, bleeding, severe headaches,   or abdominal cramping.  If you are using any tobacco products, including cigarettes, chewing tobacco, and electronic cigarettes.  If you have any questions.  Other tests that may be performed during your first trimester include:  Blood tests to find your blood type and to check for the presence of any previous infections. The tests will also be used to check for low iron levels (anemia) and protein on red blood cells (Rh antibodies). Depending on your risk factors, or if you previously had diabetes during pregnancy, you may have tests to check for high blood  sugar that affects pregnant women (gestational diabetes).  Urine tests to check for infections, diabetes, or protein in the urine.  An ultrasound to confirm the proper growth and development of the baby.  Fetal screens for spinal cord problems (spina bifida) and Down syndrome.  HIV (human immunodeficiency virus) testing. Routine prenatal testing includes screening for HIV, unless you choose not to have this test.  You may need other tests to make sure you and the baby are doing well.  Follow these instructions at home: Medicines  Follow your health care provider's instructions regarding medicine use. Specific medicines may be either safe or unsafe to take during pregnancy.  Take a prenatal vitamin that contains at least 600 micrograms (mcg) of folic acid.  If you develop constipation, try taking a stool softener if your health care provider approves. Eating and drinking  Eat a balanced diet that includes fresh fruits and vegetables, whole grains, good sources of protein such as meat, eggs, or tofu, and low-fat dairy. Your health care provider will help you determine the amount of weight gain that is right for you.  Avoid raw meat and uncooked cheese. These carry germs that can cause birth defects in the baby.  Eating four or five small meals rather than three large meals a day may help relieve nausea and vomiting. If you start to feel nauseous, eating a few soda crackers can be helpful. Drinking liquids between meals, instead of during meals, also seems to help ease nausea and vomiting.  Limit foods that are high in fat and processed sugars, such as fried and sweet foods.  To prevent constipation: ? Eat foods that are high in fiber, such as fresh fruits and vegetables, whole grains, and beans. ? Drink enough fluid to keep your urine clear or pale yellow. Activity  Exercise only as directed by your health care provider. Most women can continue their usual exercise routine during  pregnancy. Try to exercise for 30 minutes at least 5 days a week. Exercising will help you: ? Control your weight. ? Stay in shape. ? Be prepared for labor and delivery.  Experiencing pain or cramping in the lower abdomen or lower back is a good sign that you should stop exercising. Check with your health care provider before continuing with normal exercises.  Try to avoid standing for long periods of time. Move your legs often if you must stand in one place for a long time.  Avoid heavy lifting.  Wear low-heeled shoes and practice good posture.  You may continue to have sex unless your health care provider tells you not to. Relieving pain and discomfort  Wear a good support bra to relieve breast tenderness.  Take warm sitz baths to soothe any pain or discomfort caused by hemorrhoids. Use hemorrhoid cream if your health care provider approves.  Rest with your legs elevated if you have leg cramps or low back pain.  If you develop   varicose veins in your legs, wear support hose. Elevate your feet for 15 minutes, 3-4 times a day. Limit salt in your diet. Prenatal care  Schedule your prenatal visits by the twelfth week of pregnancy. They are usually scheduled monthly at first, then more often in the last 2 months before delivery.  Write down your questions. Take them to your prenatal visits.  Keep all your prenatal visits as told by your health care provider. This is important. Safety  Wear your seat belt at all times when driving.  Make a list of emergency phone numbers, including numbers for family, friends, the hospital, and police and fire departments. General instructions  Ask your health care provider for a referral to a local prenatal education class. Begin classes no later than the beginning of month 6 of your pregnancy.  Ask for help if you have counseling or nutritional needs during pregnancy. Your health care provider can offer advice or refer you to specialists for help  with various needs.  Do not use hot tubs, steam rooms, or saunas.  Do not douche or use tampons or scented sanitary pads.  Do not cross your legs for long periods of time.  Avoid cat litter boxes and soil used by cats. These carry germs that can cause birth defects in the baby and possibly loss of the fetus by miscarriage or stillbirth.  Avoid all smoking, herbs, alcohol, and medicines not prescribed by your health care provider. Chemicals in these products affect the formation and growth of the baby.  Do not use any products that contain nicotine or tobacco, such as cigarettes and e-cigarettes. If you need help quitting, ask your health care provider. You may receive counseling support and other resources to help you quit.  Schedule a dentist appointment. At home, brush your teeth with a soft toothbrush and be gentle when you floss. Contact a health care provider if:  You have dizziness.  You have mild pelvic cramps, pelvic pressure, or nagging pain in the abdominal area.  You have persistent nausea, vomiting, or diarrhea.  You have a bad smelling vaginal discharge.  You have pain when you urinate.  You notice increased swelling in your face, hands, legs, or ankles.  You are exposed to fifth disease or chickenpox.  You are exposed to German measles (rubella) and have never had it. Get help right away if:  You have a fever.  You are leaking fluid from your vagina.  You have spotting or bleeding from your vagina.  You have severe abdominal cramping or pain.  You have rapid weight gain or loss.  You vomit blood or material that looks like coffee grounds.  You develop a severe headache.  You have shortness of breath.  You have any kind of trauma, such as from a fall or a car accident. Summary  The first trimester of pregnancy is from week 1 until the end of week 13 (months 1 through 3).  Your body goes through many changes during pregnancy. The changes vary from  woman to woman.  You will have routine prenatal visits. During those visits, your health care provider will examine you, discuss any test results you may have, and talk with you about how you are feeling. This information is not intended to replace advice given to you by your health care provider. Make sure you discuss any questions you have with your health care provider. Document Released: 02/26/2001 Document Revised: 02/14/2016 Document Reviewed: 02/14/2016 Elsevier Interactive Patient Education  2018 Elsevier   Inc[employer]  

## 2017-09-21 LAB — CULTURE, OB URINE

## 2017-09-24 ENCOUNTER — Encounter (HOSPITAL_COMMUNITY): Payer: Self-pay

## 2017-09-29 ENCOUNTER — Encounter (HOSPITAL_COMMUNITY): Payer: Self-pay

## 2017-10-01 ENCOUNTER — Encounter (HOSPITAL_COMMUNITY): Payer: Self-pay

## 2017-10-01 ENCOUNTER — Ambulatory Visit (HOSPITAL_COMMUNITY)
Admission: RE | Admit: 2017-10-01 | Discharge: 2017-10-01 | Disposition: A | Discharge status: Home / Self Care | Payer: Medicaid Other | Source: Ambulatory Visit | Attending: Family | Admitting: Family

## 2017-10-01 ENCOUNTER — Ambulatory Visit (HOSPITAL_COMMUNITY): Payer: Medicaid Other

## 2017-10-01 HISTORY — DX: Anxiety disorder, unspecified: F41.9

## 2017-10-06 ENCOUNTER — Other Ambulatory Visit (HOSPITAL_COMMUNITY)
Admission: RE | Admit: 2017-10-06 | Discharge: 2017-10-06 | Disposition: A | Discharge status: Home / Self Care | Payer: Medicaid Other | Source: Ambulatory Visit | Attending: Obstetrics and Gynecology | Admitting: Obstetrics and Gynecology

## 2017-10-06 ENCOUNTER — Encounter: Payer: Self-pay | Admitting: Obstetrics and Gynecology

## 2017-10-06 ENCOUNTER — Ambulatory Visit (INDEPENDENT_AMBULATORY_CARE_PROVIDER_SITE_OTHER): Payer: Medicaid Other | Admitting: Obstetrics and Gynecology

## 2017-10-06 VITALS — BP 113/80 | HR 83 | Wt 201.8 lb

## 2017-10-06 DIAGNOSIS — O0992 Supervision of high risk pregnancy, unspecified, second trimester: Secondary | ICD-10-CM | POA: Diagnosis not present

## 2017-10-06 DIAGNOSIS — B9689 Other specified bacterial agents as the cause of diseases classified elsewhere: Secondary | ICD-10-CM | POA: Insufficient documentation

## 2017-10-06 DIAGNOSIS — N760 Acute vaginitis: Secondary | ICD-10-CM | POA: Insufficient documentation

## 2017-10-06 DIAGNOSIS — I459 Conduction disorder, unspecified: Secondary | ICD-10-CM

## 2017-10-06 DIAGNOSIS — O23592 Infection of other part of genital tract in pregnancy, second trimester: Secondary | ICD-10-CM | POA: Diagnosis not present

## 2017-10-06 DIAGNOSIS — Z3009 Encounter for other general counseling and advice on contraception: Secondary | ICD-10-CM

## 2017-10-06 MED ORDER — ONDANSETRON 4 MG PO TBDP: 4.0000 mg | ORAL_TABLET | Freq: Four times a day (QID) | ORAL | 2 refills | Status: DC | PRN

## 2017-10-06 NOTE — Patient Instructions (Signed)

## 2017-10-06 NOTE — Progress Notes (Signed)
Subjective:  Valerie [name] is a 27 y.o. [id number] at 1228w0d being seen today for first OB visit. EDD confirmed by first trimester U/S. Some problems with N/V but slowly improving. Dx with first degree heart block last month. Has appt with cardiology next week. 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  She is currently monitored for the following issues for this high-risk pregnancy and has Former smoker, stopped smoking in distant past; Family history of diabetes in pregnancy; History of facial weakness/Neuro workup/Lesion on MRI w/out change; Skin tag of perianal region; Depressive disorder, not elsewhere classified; Supervision of high risk pregnancy, antepartum, second trimester; and Heart block on their problem list.  Patient reports nausea.  Contractions: Not present. Vag. Bleeding: None.  Movement: Present. Denies leaking of fluid.   The following portions of the patient's history were reviewed and updated as appropriate: allergies, current medications, past family history, past medical history, past social history, past surgical history and problem list. Problem list updated.  Objective:   Vitals:   10/06/17 1315  BP: 113/80  Pulse: 83  Weight: 201 lb 12.8 oz (91.5 kg)    Fetal Status:     Movement: Present     General:  Alert, oriented and cooperative. Patient is in no acute distress.  Skin: Skin is warm and dry. No rash noted.   Cardiovascular: Normal heart rate noted  Respiratory: Normal respiratory effort, no problems with respiration noted  Abdomen: Soft, gravid, appropriate for gestational age. Pain/Pressure: Absent     Pelvic:  Cervical exam performed        Extremities: Normal range of motion.  Edema: Trace  Mental Status: Normal mood and affect. Normal behavior. Normal judgment and thought content.  Breast sym supple no nipple discharge or adenopathy Urinalysis:      Assessment and Plan:  Pregnancy: [id number] at 6428w0d  1. Supervision of high risk pregnancy, antepartum, second  trimester Prenatal care and labs reviewed with pt. Zofran for N/V  2. Heart Block Cardiology appt next week  3. Unwanted fertility Will need to sign BTL papers at 28 week visit  Preterm labor symptoms and general obstetric precautions including but not limited to vaginal bleeding, contractions, leaking of fluid and fetal movement were reviewed in detail with the patient. Please refer to After Visit Summary for other counseling recommendations.  Return in about 1 month (around 11/03/2017) for OB visit.   Hermina StaggersErvin, Kamar Callender L, MD

## 2017-10-07 LAB — CYTOLOGY - PAP
BACTERIAL VAGINITIS: POSITIVE — AB
Candida vaginitis: NEGATIVE
Chlamydia: NEGATIVE
DIAGNOSIS: NEGATIVE
NEISSERIA GONORRHEA: NEGATIVE

## 2017-10-08 LAB — OBSTETRIC PANEL, INCLUDING HIV
Antibody Screen: NEGATIVE
Basophils Absolute: 0 10*3/uL (ref 0.0–0.2)
Basos: 0 %
EOS (ABSOLUTE): 0.1 10*3/uL (ref 0.0–0.4)
Eos: 2 %
HEMOGLOBIN: 11.8 g/dL (ref 11.1–15.9)
HEP B S AG: NEGATIVE
HIV Screen 4th Generation wRfx: NONREACTIVE
Hematocrit: 35.2 % (ref 34.0–46.6)
IMMATURE GRANS (ABS): 0 10*3/uL (ref 0.0–0.1)
IMMATURE GRANULOCYTES: 0 %
LYMPHS: 23 %
Lymphocytes Absolute: 1.6 10*3/uL (ref 0.7–3.1)
MCH: 30.4 pg (ref 26.6–33.0)
MCHC: 33.5 g/dL (ref 31.5–35.7)
MCV: 91 fL (ref 79–97)
MONOS ABS: 0.4 10*3/uL (ref 0.1–0.9)
Monocytes: 5 %
NEUTROS PCT: 70 %
Neutrophils Absolute: 5.1 10*3/uL (ref 1.4–7.0)
Platelets: 208 10*3/uL (ref 150–450)
RBC: 3.88 x10E6/uL (ref 3.77–5.28)
RDW: 13.9 % (ref 12.3–15.4)
RH TYPE: POSITIVE
RPR Ser Ql: NONREACTIVE
Rubella Antibodies, IGG: 2.11 index (ref >0.99)
WBC: 7.3 10*3/uL (ref 3.4–10.8)

## 2017-10-08 LAB — HEMOGLOBINOPATHY EVALUATION
HEMOGLOBIN F QUANTITATION: 0 % (ref 0.0–2.0)
HGB C: 0 %
HGB S: 0 %
HGB VARIANT: 0 %
Hemoglobin A2 Quantitation: 2.5 % (ref 1.8–3.2)
Hgb A: 97.5 % (ref 96.4–98.8)

## 2017-10-08 LAB — COMPREHENSIVE METABOLIC PANEL
ALT: 14 IU/L (ref 0–32)
AST: 12 IU/L (ref 0–40)
Albumin/Globulin Ratio: 1.7 (ref 1.2–2.2)
Albumin: 4 g/dL (ref 3.5–5.5)
Alkaline Phosphatase: 84 IU/L (ref 39–117)
BUN/Creatinine Ratio: 10 (ref 9–23)
BUN: 6 mg/dL (ref 6–20)
Bilirubin Total: <0.2 mg/dL (ref 0.0–1.2)
CALCIUM: 9.4 mg/dL (ref 8.7–10.2)
CO2: 19 mmol/L — AB (ref 20–29)
CREATININE: 0.63 mg/dL (ref 0.57–1.00)
Chloride: 102 mmol/L (ref 96–106)
GFR calc Af Amer: 141 mL/min/{1.73_m2} (ref >59)
GFR calc non Af Amer: 122 mL/min/{1.73_m2} (ref >59)
GLUCOSE: 99 mg/dL (ref 65–99)
Globulin, Total: 2.3 g/dL (ref 1.5–4.5)
Potassium: 3.9 mmol/L (ref 3.5–5.2)
Sodium: 136 mmol/L (ref 134–144)
Total Protein: 6.3 g/dL (ref 6.0–8.5)

## 2017-10-08 LAB — HEMOGLOBIN A1C
ESTIMATED AVERAGE GLUCOSE: 100 mg/dL
Hgb A1c MFr Bld: 5.1 % (ref 4.8–5.6)

## 2017-10-08 LAB — THYROID PANEL WITH TSH
FREE THYROXINE INDEX: 1.1 — AB (ref 1.2–4.9)
T3 Uptake Ratio: 16 % — ABNORMAL LOW (ref 24–39)
T4, Total: 7.1 ug/dL (ref 4.5–12.0)
TSH: 2.76 u[IU]/mL (ref 0.450–4.500)

## 2017-10-08 LAB — ANA: ANA: NEGATIVE

## 2017-10-08 LAB — MAGNESIUM: MAGNESIUM: 1.8 mg/dL (ref 1.6–2.3)

## 2017-10-13 LAB — CYSTIC FIBROSIS MUTATION 97: Interpretation: NOT DETECTED

## 2017-10-14 LAB — THYROID PEROXIDASE ANTIBODY: THYROID PEROXIDASE ANTIBODY: 9 [IU]/mL (ref 0–34)

## 2017-10-14 LAB — SPECIMEN STATUS REPORT

## 2017-10-15 ENCOUNTER — Encounter: Payer: Self-pay | Admitting: Obstetrics and Gynecology

## 2017-10-16 ENCOUNTER — Encounter: Payer: Self-pay | Admitting: Cardiology

## 2017-10-16 ENCOUNTER — Ambulatory Visit: Payer: Medicaid Other | Admitting: Cardiology

## 2017-10-16 VITALS — BP 120/66 | HR 114 | Ht 64.0 in | Wt 201.0 lb

## 2017-10-16 DIAGNOSIS — R002 Palpitations: Secondary | ICD-10-CM | POA: Diagnosis not present

## 2017-10-16 DIAGNOSIS — R011 Cardiac murmur, unspecified: Secondary | ICD-10-CM | POA: Insufficient documentation

## 2017-10-16 DIAGNOSIS — I440 Atrioventricular block, first degree: Secondary | ICD-10-CM | POA: Diagnosis not present

## 2017-10-16 NOTE — Patient Instructions (Signed)
Medication Instructions:  Your physician recommends that you continue on your current medications as directed. Please refer to the Current Medication list given to you today.   Labwork: None  Testing/Procedures: Your physician has requested that you have an echocardiogram. Echocardiography is a painless test that uses sound waves to create images of your heart. It provides your doctor with information about the size and shape of your heart and how well your heart's chambers and valves are working. This procedure takes approximately one hour. There are no restrictions for this procedure.  Your physician has recommended that you wear a holter monitor. Holter monitors are medical devices that record the heart's electrical activity. Doctors most often use these monitors to diagnose arrhythmias. Arrhythmias are problems with the speed or rhythm of the heartbeat. The monitor is a small, portable device. You can wear one while you do your normal daily activities. This is usually used to diagnose what is causing palpitations/syncope (passing out). Wear for 48 hours.   Follow-Up: Your physician wants you to follow-up in: 4 months. You will receive a reminder letter in the mail two months in advance. If you don't receive a letter, please call our office to schedule the follow-up appointment.   If you need a refill on your cardiac medications before your next appointment, please call your pharmacy.   Thank you for choosing [facility]! Mady GemmaCatherine Aleigh Grunden, RN [phone]    Echocardiogram An echocardiogram, or echocardiography, uses sound waves (ultrasound) to produce an image of your heart. The echocardiogram is simple, painless, obtained within a short period of time, and offers valuable information to your health care provider. The images from an echocardiogram can provide information such as:  Evidence of coronary artery disease (CAD).  Heart size.  Heart muscle function.  Heart valve  function.  Aneurysm detection.  Evidence of a past heart attack.  Fluid buildup around the heart.  Heart muscle thickening.  Assess heart valve function.  Tell a health care provider about:  Any allergies you have.  All medicines you are taking, including vitamins, herbs, eye drops, creams, and over-the-counter medicines.  Any problems you or family members have had with anesthetic medicines.  Any blood disorders you have.  Any surgeries you have had.  Any medical conditions you have.  Whether you are pregnant or may be pregnant. What happens before the procedure? No special preparation is needed. Eat and drink normally. What happens during the procedure?  In order to produce an image of your heart, gel will be applied to your chest and a wand-like tool (transducer) will be moved over your chest. The gel will help transmit the sound waves from the transducer. The sound waves will harmlessly bounce off your heart to allow the heart images to be captured in real-time motion. These images will then be recorded.  You may need an IV to receive a medicine that improves the quality of the pictures. What happens after the procedure? You may return to your normal schedule including diet, activities, and medicines, unless your health care provider tells you otherwise. This information is not intended to replace advice given to you by your health care provider. Make sure you discuss any questions you have with your health care provider. Document Released: 03/01/2000 Document Revised: 10/21/2015 Document Reviewed: 11/09/2012 Elsevier Interactive Patient Education  2017 [employer] Inc[employer]     Holter Monitoring A Holter monitor is a small device that is used to detect abnormal heart rhythms. It clips to your clothing and is  connected by wires to flat, sticky disks (electrodes) that attach to your chest. It is worn continuously for 24-48 hours. Follow these instructions at home:  Wear  your Holter monitor at all times, even while exercising and sleeping, for as long as directed by your health care provider.  Make sure that the Holter monitor is safely clipped to your clothing or close to your body as recommended by your health care provider.  Do not get the monitor or wires wet.  Do not put body lotion or moisturizer on your chest.  Keep your skin clean.  Keep a diary of your daily activities, such as walking and doing chores. If you feel that your heartbeat is abnormal or that your heart is fluttering or skipping a beat: ? Record what you are doing when it happens. ? Record what time of day the symptoms occur.  Return your Holter monitor as directed by your health care provider.  Keep all follow-up visits as directed by your health care provider. This is important. Get help right away if:  You feel lightheaded or you faint.  You have trouble breathing.  You feel pain in your chest, upper arm, or jaw.  You feel sick to your stomach and your skin is pale, cool, or damp.  You heartbeat feels unusual or abnormal. This information is not intended to replace advice given to you by your health care provider. Make sure you discuss any questions you have with your health care provider. Document Released: 12/01/2003 Document Revised: 08/10/2015 Document Reviewed: 10/11/2013 Elsevier Interactive Patient Education  [employer].

## 2017-10-16 NOTE — Progress Notes (Signed)
Cardiology Office Note:    Date:  10/16/2017   ID:  Valerie [name], DOB [birth year], MRN [id number]  PCP:  Clayborn Heron, MD  Cardiologist:  Garwin Brothers, MD   Referring MD: Armando Reichert, CNM    ASSESSMENT:    1. Palpitations   2. Cardiac murmur   3. First degree AV block    PLAN:    In order of problems listed above:  1. I reassured the patient about my findings.  Her examination is largely unremarkable today.  In view of her palpitations and noted that her TSH is unremarkable.  She will also go 48-hour Holter monitoring to assess her palpitations which are carpeted much on a daily basis. 2. I told her that if this really gets to the point that she is having problems then I would initiate her on a beta-blocker.  I told her that since she is pregnant I would prefer not to medicate her in a haste.  She vocalized understanding and agrees. 3. Echocardiogram will be done to assess murmur heard on auscultation. 4. Patient and mother had multiple questions which were answered to their satisfaction.  I counseled her about ever going back to smoking.  She is stopped during both of her pregnancies.  She vocalized understanding and is agreeable. 5. Patient will be seen in follow-up appointment in 4 months or earlier if the patient has any concerns    Medication Adjustments/Labs and Tests Ordered: Current medicines are reviewed at length with the patient today.  Concerns regarding medicines are outlined above.  No orders of the defined types were placed in this encounter.  No orders of the defined types were placed in this encounter.    History of Present Illness:    Valerie [name] is a 27 y.o. female who is being seen today for the evaluation of palpitations at the request of Armando Reichert, CNM.  Patient is a pleasant 27 year old female.  She mentions to me that she is about [redacted] weeks pregnant.  This is her second pregnancy.  She mentions to me that at times she has had  palpitations and dizzy spells.  She feels that she may pass out but she has never passed out.  No chest pain orthopnea or PND.  At the time of my evaluation, the patient is alert awake oriented and in no distress.  Past Medical History:  Diagnosis Date  . Abnormal Pap smear   . Anemia   . Anxiety   . Eczema   . Gastric ulcer    2000  . Infection    UTI, chronic sinus inf  . Migraine   . Ovarian cyst   . UTI (urinary tract infection)   . Vaginal Pap smear, abnormal     Past Surgical History:  Procedure Laterality Date  . ADENOIDECTOMY    . COLPOSCOPY    . CRYOTHERAPY    . MYRINGOTOMY WITH TUBE PLACEMENT      Current Medications: Current Meds  Medication Sig  . acetaminophen (TYLENOL) 500 MG tablet Take 1,000 mg by mouth every 6 (six) hours as needed for headache (headache). Reported on 04/04/2015  . docusate sodium (COLACE) 100 MG capsule Take 100 mg by mouth daily as needed for mild constipation.  . ondansetron (ZOFRAN ODT) 4 MG disintegrating tablet Take 1 tablet (4 mg total) by mouth every 6 (six) hours as needed for nausea.  . Prenatal Vit-Fe Fumarate-FA (PRENATAL MULTIVITAMIN) TABS tablet Take 1 tablet by mouth daily  at 12 noon.  . promethazine (PHENERGAN) 12.5 MG tablet Take 12.5 mg by mouth every 6 (six) hours as needed for nausea or vomiting.     Allergies:   Augmentin [amoxicillin-pot clavulanate]; Ciprofloxacin; Doxycycline; Hydrocodone; Prednisone; and Raspberry   Social History   Socioeconomic History  . Marital status: Legally Separated    Spouse name: Not on file  . Number of children: Not on file  . Years of education: Not on file  . Highest education level: Not on file  Occupational History  . Not on file  Social Needs  . Financial resource strain: Not on file  . Food insecurity:    Worry: Not on file    Inability: Not on file  . Transportation needs:    Medical: Not on file    Non-medical: Not on file  Tobacco Use  . Smoking status: Former  Smoker    Packs/day: 0.25    Types: Cigarettes    Last attempt to quit: 08/05/2017    Years since quitting: 0.1  . Smokeless tobacco: Never Used  Substance and Sexual Activity  . Alcohol use: Never    Frequency: Never  . Drug use: No  . Sexual activity: Yes    Partners: Male    Birth control/protection: None  Lifestyle  . Physical activity:    Days per week: Not on file    Minutes per session: Not on file  . Stress: Not on file  Relationships  . Social connections:    Talks on phone: Not on file    Gets together: Not on file    Attends religious service: Not on file    Active member of club or organization: Not on file    Attends meetings of clubs or organizations: Not on file    Relationship status: Not on file  Other Topics Concern  . Not on file  Social History Narrative  . Not on file     Family History: The patient's family history includes Cancer in her paternal aunt; Diabetes in her father, paternal grandfather, paternal grandmother, and paternal uncle; Hypertension in her father, mother, paternal grandfather, and paternal grandmother.  ROS:   Please see the history of present illness.    All other systems reviewed and are negative.  EKGs/Labs/Other Studies Reviewed:    The following studies were reviewed today: EKG reveals sinus rhythm with tachycardia and first-degree AV block   Recent Labs: 10/06/2017: ALT 14; BUN 6; Creatinine, Ser 0.63; Hemoglobin 11.8; Magnesium 1.8; Platelets 208; Potassium 3.9; Sodium 136; TSH 2.760  Recent Lipid Panel No results found for: CHOL, TRIG, HDL, CHOLHDL, VLDL, LDLCALC, LDLDIRECT  Physical Exam:    VS:  BP 120/66   Pulse (!) 114   Ht 5\' 4"  (1.626 m)   Wt 201 lb (91.2 kg)   LMP 06/30/2017   SpO2 98%   BMI 34.50 kg/m     Wt Readings from Last 3 Encounters:  10/16/17 201 lb (91.2 kg)  10/06/17 201 lb 12.8 oz (91.5 kg)  09/19/17 208 lb (94.3 kg)     GEN: Patient is in no acute distress HEENT: Normal NECK: No  JVD; No carotid bruits LYMPHATICS: No lymphadenopathy CARDIAC: S1 S2 regular, 2/6 systolic murmur at the apex. RESPIRATORY:  Clear to auscultation without rales, wheezing or rhonchi  ABDOMEN: Soft, non-tender, non-distended MUSCULOSKELETAL:  No edema; No deformity  SKIN: Warm and dry NEUROLOGIC:  Alert and oriented x 3 PSYCHIATRIC:  Normal affect    Signed, Aundra Dubin Revankar,  MD  10/16/2017 3:17 PM    [facility]

## 2017-10-17 ENCOUNTER — Other Ambulatory Visit: Payer: Self-pay

## 2017-10-17 DIAGNOSIS — B9689 Other specified bacterial agents as the cause of diseases classified elsewhere: Secondary | ICD-10-CM

## 2017-10-17 DIAGNOSIS — N760 Acute vaginitis: Principal | ICD-10-CM

## 2017-10-17 MED ORDER — METRONIDAZOLE 500 MG PO TABS: 500.0000 mg | ORAL_TABLET | Freq: Two times a day (BID) | ORAL | 0 refills | Status: DC

## 2017-10-21 ENCOUNTER — Encounter (HOSPITAL_COMMUNITY): Payer: Self-pay

## 2017-10-21 ENCOUNTER — Telehealth: Payer: Self-pay

## 2017-10-21 ENCOUNTER — Inpatient Hospital Stay (HOSPITAL_COMMUNITY)
Admission: AD | Admit: 2017-10-21 | Discharge: 2017-10-21 | Disposition: A | Discharge status: Home / Self Care | Payer: Medicaid Other | Source: Ambulatory Visit | Attending: Obstetrics & Gynecology | Admitting: Obstetrics & Gynecology

## 2017-10-21 DIAGNOSIS — R51 Headache: Secondary | ICD-10-CM | POA: Diagnosis present

## 2017-10-21 DIAGNOSIS — Z3A16 16 weeks gestation of pregnancy: Secondary | ICD-10-CM | POA: Insufficient documentation

## 2017-10-21 DIAGNOSIS — O99412 Diseases of the circulatory system complicating pregnancy, second trimester: Secondary | ICD-10-CM | POA: Insufficient documentation

## 2017-10-21 DIAGNOSIS — I440 Atrioventricular block, first degree: Secondary | ICD-10-CM

## 2017-10-21 DIAGNOSIS — Z87898 Personal history of other specified conditions: Secondary | ICD-10-CM

## 2017-10-21 DIAGNOSIS — O99342 Other mental disorders complicating pregnancy, second trimester: Secondary | ICD-10-CM | POA: Diagnosis not present

## 2017-10-21 DIAGNOSIS — O26892 Other specified pregnancy related conditions, second trimester: Secondary | ICD-10-CM | POA: Insufficient documentation

## 2017-10-21 DIAGNOSIS — O9989 Other specified diseases and conditions complicating pregnancy, childbirth and the puerperium: Secondary | ICD-10-CM | POA: Diagnosis not present

## 2017-10-21 DIAGNOSIS — R42 Dizziness and giddiness: Secondary | ICD-10-CM

## 2017-10-21 DIAGNOSIS — F419 Anxiety disorder, unspecified: Secondary | ICD-10-CM | POA: Insufficient documentation

## 2017-10-21 DIAGNOSIS — Z87891 Personal history of nicotine dependence: Secondary | ICD-10-CM | POA: Insufficient documentation

## 2017-10-21 DIAGNOSIS — Z3482 Encounter for supervision of other normal pregnancy, second trimester: Secondary | ICD-10-CM

## 2017-10-21 NOTE — Telephone Encounter (Signed)
Pt called stating that she has been feeling dizzy, and having a hard time catching her breath. She states that her instructor checked her bp at school and it was 128/100 and 126/96. She denies having any headaches outside of the migraines that she has, no visual changes, or RUQ pain. Pt states that she does not have a history of hypertension. Pt advised to go to [facility]U for evaluation.

## 2017-10-21 NOTE — Discharge Instructions (Signed)
First-Degree Atrioventricular Block What is atrioventricular block? Atrioventricular (AV) block, also called heart block, is a problem with the system that controls how often the heart beats (heart rate) and the pattern of heart beats (heart rhythm). In this condition, the signals that travel from the heart's upper chambers (atria) to its lower chambers (ventricles) move too slowly or are interrupted. There are several types of heart block:  First-degree.  Second-degree.  Third-degree or complete.  What is first-degree heart block?  First-degree AV block is the least serious type of heart block. In this condition, the signals that control heart rate move too slowly. As a result, the heart may beat more slowly than normal. First-degree AV block can increase your risk of developing a type of irregular heartbeat called atrial fibrillation. What are the causes? This condition may be caused by:  Any condition that damages the system that controls the heart's rate and rhythm, such as a heart attack.  Overstimulation of the nerve that slows down heart rate (vagus nerve). This cause is common among well-conditioned athletes.  Some medicines that slow down heart rate, such as beta blockers or calcium channel blockers.  Surgery that damages the heart.  Some people are born with this condition (congenital heart block), but most people develop it over time. What increases the risk? The risk for this condition increases with age. You are also more likely to develop this condition if you have:  A history of heart attack.  Heart failure.  Coronary heart disease.  Inflammation of heart muscle (myocarditis).  Disease of heart muscle (cardiomyopathy).  Infection of the heart valves (endocarditis).  Infections or diseases that affect the heart. These include: ? Lyme disease. ? Sarcoidosis. ? Hemochromatosis. ? Rheumatic fever. ? Muscle disorders including Lev disease and Lenegre  disease.  Babies are more likely to be born with heart block if:  The mother has an autoimmune disease, such as lupus.  The baby is born with a heart defect that affects the heart's structure.  A parent was born with a heart defect.  What are the signs or symptoms? This condition usually does not cause any symptoms. How is this diagnosed? This condition may be diagnosed based on:  A physical exam.  Your medical history.  A measurement of your pulse or heartbeat.  Tests. These may include: ? An electrocardiogram (ECG). This test is done to check for problems with electrical activity in the heart. ? A Holter monitor or event monitor test. This test involves wearing a portable device that monitors your heart rate over time. ? An electrophysiology (EP) study. This test involves having long, thin tubes (catheters) placed in the heart. This test records electrical signals in the heart.  How is this treated? Usually, treatment is not needed for this condition. In some cases, treatment involves:  Treating an underlying condition, such as heart disease.  Changing or stopping any heart medicines that can cause heart block.  Follow these instructions at home:   Take over-the-counter and prescription medicines only as told by your health care provider.  Work with your health care provider to control lifestyle choices that increase your risk for heart disease. You may need to: ? Get regular exercise. Each week, try to get 150 minutes of moderate-intensity activity (such as walking or yoga) or 75 minutes of vigorous activity (such as running or swimming). Ask your health care provider what type of exercise is safe for you. ? Eat a heart-healthy diet with fruits and vegetables, whole   grains, low-fat dairy products, and lean proteins like poultry and eggs. Your health care provider or diet and nutrition specialist (dietitian) can help you make healthy choices. ? Maintain a healthy  weight. ? Limit alcohol intake to no more than 1 drink per day for nonpregnant women and 2 drinks per day for men. One drink equals 12 oz of beer, 5 oz of wine, or 1 oz of hard liquor.  Do not use any products that contain nicotine or tobacco, such as cigarettes and e-cigarettes. If you need help quitting, ask your health care provider.  Keep all follow-up visits as told by your health care provider. This is important. Contact a health care provider if:  You feel like your heart is skipping beats.  You feel more tired than normal.  You have swelling in your hands, feet, or lower legs. Get help right away if:  Your symptoms change or they get worse.  You develop new symptoms.  You have chest pain, especially if the pain: ? Feels like crushing or pressure. ? Spreads to your arms, back, neck, or jaw.  You feel short of breath.  You feel light-headed or weak.  You faint. Summary  First-degree AV block is the least serious type of heart block. In this condition, the signals that control heart rate move too slowly. As a result, the heart may beat more slowly than normal.  Usually, treatment is not needed for this condition. In some cases, you may need to change or stop medications that may be making the condition worse.  Healthy lifestyle choices such as exercising regularly, eating a healthy diet, and limiting alcohol are good for your heart. This information is not intended to replace advice given to you by your health care provider. Make sure you discuss any questions you have with your health care provider. Document Released: 02/15/2008 Document Revised: 10/20/2015 Document Reviewed: 10/20/2015 Elsevier Interactive Patient Education  2018 Elsevier Inc.  

## 2017-10-21 NOTE — MAU Provider Note (Signed)
History     CSN: [id number]  Arrival date and time: 10/21/17 1548   First Provider Initiated Contact with Patient 10/21/17 1636      Chief Complaint  Patient presents with  . Dizziness  . Shortness of Breath  . Headache   HPI  Valerie [name] is a 27 y.o. [phone] at [redacted]w[redacted]d who presents to MAU with chief complaint of heart palpitations in the setting of First Degree Heart block. 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 She reported her symptoms to her instructor, who took her BP and received a reading of 128/100 and 126/96. Denies SOB, lightheadedness, syncope. States these are her normal symptoms.  Patient attended Cardiology consult 10/16/2017 and has an established plan of care that includes Holter monitor and echocardiogram in the coming weeks.   Denies all pregnancy-related symptoms. Denies vaginal bleeding, leaking of fluid, fever, falls, or recent illness.     OB History    Gravida  4   Para  1   Term  1   Preterm      AB  2   Living  1     SAB  2   TAB      Ectopic      Multiple      Live Births  1           Past Medical History:  Diagnosis Date  . Abnormal Pap smear   . Anemia   . Anxiety   . Eczema   . Gastric ulcer    2000  . Infection    UTI, chronic sinus inf  . Migraine   . Ovarian cyst   . UTI (urinary tract infection)   . Vaginal Pap smear, abnormal     Past Surgical History:  Procedure Laterality Date  . ADENOIDECTOMY    . COLPOSCOPY    . CRYOTHERAPY    . MYRINGOTOMY WITH TUBE PLACEMENT      Family History  Problem Relation Age of Onset  . Hypertension Mother   . Diabetes Father   . Hypertension Father   . Cancer Paternal Aunt        female  . Diabetes Paternal Uncle   . Diabetes Paternal Grandmother   . Hypertension Paternal Grandmother   . Diabetes Paternal Grandfather   . Hypertension Paternal Grandfather     Social History    Tobacco Use  . Smoking status: Former Smoker    Packs/day: 0.25    Types: Cigarettes    Last attempt to quit: 08/05/2017    Years since quitting: 0.2  . Smokeless tobacco: Never Used  Substance Use Topics  . Alcohol use: Never    Frequency: Never  . Drug use: No    Allergies:  Allergies  Allergen Reactions  . Augmentin [Amoxicillin-Pot Clavulanate] Diarrhea and Nausea And Vomiting       . Ciprofloxacin Hives  . Doxycycline Hives  . Hydrocodone Hives  . Prednisone     Had a reaction to high doses and was told not to take again  . Raspberry Swelling    Throat swelling    Medications Prior to Admission  Medication Sig Dispense Refill Last Dose  . acetaminophen (TYLENOL) 500 MG tablet Take 1,000 mg by mouth every 6 (six) hours as needed for headache (headache). Reported on 04/04/2015   Taking  . docusate sodium (COLACE) 100 MG capsule Take 100 mg by mouth daily as  needed for mild constipation.   Taking  . metroNIDAZOLE (FLAGYL) 500 MG tablet Take 1 tablet (500 mg total) by mouth 2 (two) times daily. 14 tablet 0   . ondansetron (ZOFRAN ODT) 4 MG disintegrating tablet Take 1 tablet (4 mg total) by mouth every 6 (six) hours as needed for nausea. 20 tablet 2 Taking  . Prenatal Vit-Fe Fumarate-FA (PRENATAL MULTIVITAMIN) TABS tablet Take 1 tablet by mouth daily at 12 noon.   Taking  . promethazine (PHENERGAN) 12.5 MG tablet Take 12.5 mg by mouth every 6 (six) hours as needed for nausea or vomiting.   Taking    Review of Systems  Respiratory: Negative for apnea, cough, choking, chest tightness, shortness of breath, wheezing and stridor.   Gastrointestinal: Negative for abdominal pain, nausea and vomiting.  Genitourinary: Negative for vaginal bleeding, vaginal discharge and vaginal pain.  Neurological: Negative for dizziness, syncope, weakness, light-headedness, numbness and headaches.  All other systems reviewed and are negative.  Physical Exam   Blood pressure 121/78, pulse 90,  temperature 98.1 F (36.7 C), resp. rate 18, height 5\' 4"  (1.626 m), weight 199 lb (90.3 kg), last menstrual period 06/30/2017, SpO2 99 %.  Physical Exam  Nursing note and vitals reviewed. Constitutional: She is oriented to person, place, and time. She appears well-developed and well-nourished.  Cardiovascular: Normal rate, normal heart sounds and intact distal pulses.  Respiratory: Effort normal and breath sounds normal. No respiratory distress. She has no wheezes. She has no rales. She exhibits no tenderness.  Genitourinary:  Genitourinary Comments: Not assessed based on HPI and chief complaint  Neurological: She is alert and oriented to person, place, and time. She has normal reflexes.  Skin: Skin is warm and dry.  Psychiatric: She has a normal mood and affect. Her behavior is normal. Judgment and thought content normal.    MAU Course  Procedures  MDM --Normotensive in MAU --EKG significant for First degree AV block, consistent with previous diagnosis --Attempted to reach Dr. Tomie Chinaevankar, not available until tomorrow morning --Discussed HPI with Lizabeth LeydenNina Hammond, AGNP, MSN who conferred with her team. They determined patient is stable for discharge and should plan to continue existing plan for followup as discussed with Dr. Tomie Chinaevankar  Patient Vitals for the past 24 hrs:  BP Temp Pulse Resp SpO2 Height Weight  10/21/17 1604 121/78 98.1 F (36.7 C) 90 18 99 % 5\' 4"  (1.626 m) 199 lb (90.3 kg)    Orders Placed This Encounter  Procedures  . ED EKG  . EKG 12-Lead  . Discharge patient    Assessment and Plan  --27 y.o. [name] at 3446w1d --FHT 150 --No pregnancy-related concerns --Per Jeraldine LootsHammond, AGNP patient to call clinic for worsening symptoms, safe for discharge home  Calvert CantorSamantha C Weinhold, CNM 10/21/2017, 5:30 PM

## 2017-10-21 NOTE — MAU Note (Signed)
Pt reports she had an episode of dizziness, then she had some shortness of breath and b/p was elevated at that time.

## 2017-10-22 ENCOUNTER — Other Ambulatory Visit (HOSPITAL_COMMUNITY): Payer: Medicaid Other

## 2017-10-23 ENCOUNTER — Telehealth: Payer: Self-pay

## 2017-10-23 NOTE — Telephone Encounter (Signed)
Per the request of Dr. Tomie Chinaevankar called patient for wellness check. Left voicemail for the patient to call the office with any concerns.

## 2017-10-23 NOTE — Telephone Encounter (Signed)
-----   Message from Garwin Brothersajan R Revankar, MD sent at 10/22/2017  9:07 AM EDT ----- Regarding: RE: Women's ED visit Please call the patient check with how she is doing.  Thanks ----- Message ----- From: Craige CottaAnderson, Josey Forcier S, RN Sent: 10/21/2017   5:03 PM To: Garwin Brothersajan R Revankar, MD Subject: Women's ED visit                               Per Elmer SowSamantha Winehold the patient presented to the ED with what she "suspects" is the same issues observed previously but cannot diagnose it as it is "out of her scope." Will either send patient to [facility] or home. Does not need a call back regarding this patient. Wanted to let you know that the patient stated you were her "angel" and made her feel listened to. Thanks.

## 2017-10-24 ENCOUNTER — Encounter: Payer: Self-pay | Admitting: Cardiology

## 2017-11-04 ENCOUNTER — Encounter: Payer: Medicaid Other | Admitting: Obstetrics and Gynecology

## 2017-11-05 ENCOUNTER — Ambulatory Visit (HOSPITAL_BASED_OUTPATIENT_CLINIC_OR_DEPARTMENT_OTHER)
Admission: RE | Admit: 2017-11-05 | Discharge: 2017-11-05 | Disposition: A | Discharge status: Home / Self Care | Payer: Medicaid Other | Source: Ambulatory Visit | Attending: Cardiology | Admitting: Cardiology

## 2017-11-05 DIAGNOSIS — I440 Atrioventricular block, first degree: Secondary | ICD-10-CM | POA: Diagnosis present

## 2017-11-05 DIAGNOSIS — R002 Palpitations: Secondary | ICD-10-CM

## 2017-11-05 DIAGNOSIS — R011 Cardiac murmur, unspecified: Secondary | ICD-10-CM | POA: Diagnosis not present

## 2017-11-05 NOTE — Progress Notes (Signed)
  Echocardiogram 2D Echocardiogram has been performed.  Valerie [name] 11/05/2017, 3:38 PM

## 2017-11-06 ENCOUNTER — Ambulatory Visit (INDEPENDENT_AMBULATORY_CARE_PROVIDER_SITE_OTHER): Payer: Medicaid Other | Admitting: Obstetrics and Gynecology

## 2017-11-06 ENCOUNTER — Other Ambulatory Visit (HOSPITAL_COMMUNITY)
Admission: RE | Admit: 2017-11-06 | Discharge: 2017-11-06 | Disposition: A | Discharge status: Home / Self Care | Payer: Medicaid Other | Source: Ambulatory Visit | Attending: Obstetrics and Gynecology | Admitting: Obstetrics and Gynecology

## 2017-11-06 ENCOUNTER — Encounter: Payer: Self-pay | Admitting: Obstetrics and Gynecology

## 2017-11-06 VITALS — BP 106/76 | HR 86 | Wt 192.5 lb

## 2017-11-06 DIAGNOSIS — O99342 Other mental disorders complicating pregnancy, second trimester: Secondary | ICD-10-CM | POA: Insufficient documentation

## 2017-11-06 DIAGNOSIS — Z87891 Personal history of nicotine dependence: Secondary | ICD-10-CM | POA: Diagnosis not present

## 2017-11-06 DIAGNOSIS — Z3009 Encounter for other general counseling and advice on contraception: Secondary | ICD-10-CM

## 2017-11-06 DIAGNOSIS — N898 Other specified noninflammatory disorders of vagina: Secondary | ICD-10-CM

## 2017-11-06 DIAGNOSIS — O0992 Supervision of high risk pregnancy, unspecified, second trimester: Secondary | ICD-10-CM | POA: Insufficient documentation

## 2017-11-06 DIAGNOSIS — Z3A18 18 weeks gestation of pregnancy: Secondary | ICD-10-CM | POA: Insufficient documentation

## 2017-11-06 DIAGNOSIS — I440 Atrioventricular block, first degree: Secondary | ICD-10-CM | POA: Insufficient documentation

## 2017-11-06 DIAGNOSIS — F329 Major depressive disorder, single episode, unspecified: Secondary | ICD-10-CM | POA: Insufficient documentation

## 2017-11-06 DIAGNOSIS — O99412 Diseases of the circulatory system complicating pregnancy, second trimester: Secondary | ICD-10-CM | POA: Insufficient documentation

## 2017-11-06 MED ORDER — CLOTRIMAZOLE 1 % VA CREA: 1.0000 | TOPICAL_CREAM | Freq: Every day | VAGINAL | 2 refills | Status: DC

## 2017-11-06 NOTE — Patient Instructions (Signed)
High-Fiber Diet  Fiber, also called dietary fiber, is a type of carbohydrate found in fruits, vegetables, whole grains, and beans. A high-fiber diet can have many health benefits. Your health care provider may recommend a high-fiber diet to help:  · Prevent constipation. Fiber can make your bowel movements more regular.  · Lower your cholesterol.  · Relieve hemorrhoids, uncomplicated diverticulosis, or irritable bowel syndrome.  · Prevent overeating as part of a weight-loss plan.  · Prevent heart disease, type 2 diabetes, and certain cancers.    What is my plan?  The recommended daily intake of fiber includes:  · 38 grams for men under age 50.  · 30 grams for men over age 50.  · 25 grams for women under age 50.  · 21 grams for women over age 50.    You can get the recommended daily intake of dietary fiber by eating a variety of fruits, vegetables, grains, and beans. Your health care provider may also recommend a fiber supplement if it is not possible to get enough fiber through your diet.  What do I need to know about a high-fiber diet?  · Fiber supplements have not been widely studied for their effectiveness, so it is better to get fiber through food sources.  · Always check the fiber content on the nutrition facts label of any prepackaged food. Look for foods that contain at least 5 grams of fiber per serving.  · Ask your dietitian if you have questions about specific foods that are related to your condition, especially if those foods are not listed in the following section.  · Increase your daily fiber consumption gradually. Increasing your intake of dietary fiber too quickly may cause bloating, cramping, or gas.  · Drink plenty of water. Water helps you to digest fiber.  What foods can I eat?  Grains  Whole-grain breads. Multigrain cereal. Oats and oatmeal. Brown rice. Barley. Bulgur wheat. Millet. Bran muffins. Popcorn. Rye wafer crackers.  Vegetables   Sweet potatoes. Spinach. Kale. Artichokes. Cabbage. Broccoli. Green peas. Carrots. Squash.  Fruits  Berries. Pears. Apples. Oranges. Avocados. Prunes and raisins. Dried figs.  Meats and Other Protein Sources  Navy, kidney, pinto, and soy beans. Split peas. Lentils. Nuts and seeds.  Dairy  Fiber-fortified yogurt.  Beverages  Fiber-fortified soy milk. Fiber-fortified orange juice.  Other  Fiber bars.  The items listed above may not be a complete list of recommended foods or beverages. Contact your dietitian for more options.  What foods are not recommended?  Grains  White bread. Pasta made with refined flour. White rice.  Vegetables  Fried potatoes. Canned vegetables. Well-cooked vegetables.  Fruits  Fruit juice. Cooked, strained fruit.  Meats and Other Protein Sources  Fatty cuts of meat. Fried poultry or fried fish.  Dairy  Milk. Yogurt. Cream cheese. Sour cream.  Beverages  Soft drinks.  Other  Cakes and pastries. Butter and oils.  The items listed above may not be a complete list of foods and beverages to avoid. Contact your dietitian for more information.  What are some tips for including high-fiber foods in my diet?  · Eat a wide variety of high-fiber foods.  · Make sure that half of all grains consumed each day are whole grains.  · Replace breads and cereals made from refined flour or white flour with whole-grain breads and cereals.  · Replace white rice with brown rice, bulgur wheat, or millet.  · Start the day with a breakfast that is high in fiber,   such as a cereal that contains at least 5 grams of fiber per serving.  · Use beans in place of meat in soups, salads, or pasta.  · Eat high-fiber snacks, such as berries, raw vegetables, nuts, or popcorn.  This information is not intended to replace advice given to you by your health care provider. Make sure you discuss any questions you have with your health care provider.  Document Released: 03/04/2005 Document Revised: 08/10/2015 Document Reviewed: 08/17/2013   Elsevier Interactive Patient Education © 2018 [employer]

## 2017-11-06 NOTE — Progress Notes (Signed)
   PRENATAL VISIT NOTE  Subjective:  Valerie [name] is a 27 y.o. [phone] at 887w3d being seen today for ongoing prenatal care.  She is currently monitored for the following issues for this high-risk pregnancy and has Former smoker, stopped smoking in distant past; Family history of diabetes in pregnancy; History of facial weakness/Neuro workup/Lesion on MRI w/out change; Skin tag of perianal region; Depressive disorder, not elsewhere classified; Supervision of high risk pregnancy, antepartum, second trimester; Heart block; Unwanted fertility; Palpitations; Cardiac murmur; and First degree AV block on their problem list.  Patient reports some minor tightening of her uterus over the weekend, very irregular and not painful. Has noticed thick white discharge the last few days.  Contractions: Irritability. Vag. Bleeding: None.  Movement: Present. Denies leaking of fluid.   The following portions of the patient's history were reviewed and updated as appropriate: allergies, current medications, past family history, past medical history, past social history, past surgical history and problem list. Problem list updated.  Objective:   Vitals:   11/06/17 0815  BP: 106/76  Pulse: 86  Weight: 192 lb 8 oz (87.3 kg)    Fetal Status: Fetal Heart Rate (bpm): 156   Movement: Present     General:  Alert, oriented and cooperative. Patient is in no acute distress.  Skin: Skin is warm and dry. No rash noted.   Cardiovascular: Normal heart rate noted  Respiratory: Normal respiratory effort, no problems with respiration noted  Abdomen: Soft, gravid, appropriate for gestational age.  Pain/Pressure: Present     Pelvic: Cervical exam deferred        Extremities: Normal range of motion.  Edema: None  Mental Status: Normal mood and affect. Normal behavior. Normal judgment and thought content.   Assessment and Plan:  Pregnancy: G4P1021 at 587w3d  1. First degree AV block Had echo yesterday, results pending Has  holter monitor appt next week  2. Supervision of high risk pregnancy, antepartum, second trimester  3. Unwanted fertility Desires BTL, needs to sign papers  4. Vaginal discharge Swab sent Clotrimazole sent to pharmacy  Preterm labor symptoms and general obstetric precautions including but not limited to vaginal bleeding, contractions, leaking of fluid and fetal movement were reviewed in detail with the patient. Please refer to After Visit Summary for other counseling recommendations.  Return in about 4 weeks (around 12/04/2017) for OB visit (MD).  Future Appointments  Date Time Provider Department Center  11/10/2017 10:45 AM [facility] US 2 [facility]  11/13/2017 10:00 AM [facility] None  12/04/2017 10:30 AM Constant, Gigi GinPeggy, MD [facility] None    Conan BowensKelly M Anja Neuzil, MD

## 2017-11-07 ENCOUNTER — Telehealth: Payer: Self-pay | Admitting: Cardiology

## 2017-11-07 LAB — CERVICOVAGINAL ANCILLARY ONLY
Bacterial vaginitis: POSITIVE — AB
Candida vaginitis: POSITIVE — AB

## 2017-11-07 NOTE — Telephone Encounter (Signed)
Horald PollenSarah Kennerly [phone]  Maralyn SagoSarah called to see if the results from her Echo on Wednesday were in. She also wanted to say Thank You to everyone here for being amazing during her recent office visit her phone calls.

## 2017-11-07 NOTE — Telephone Encounter (Signed)
Informed patient that results are not back yet will call and inform when they are released.

## 2017-11-08 ENCOUNTER — Inpatient Hospital Stay (HOSPITAL_COMMUNITY)
Admission: AD | Admit: 2017-11-08 | Discharge: 2017-11-08 | Disposition: A | Discharge status: Home / Self Care | Payer: Medicaid Other | Source: Ambulatory Visit | Attending: Obstetrics and Gynecology | Admitting: Obstetrics and Gynecology

## 2017-11-08 ENCOUNTER — Encounter (HOSPITAL_COMMUNITY): Payer: Self-pay | Admitting: *Deleted

## 2017-11-08 DIAGNOSIS — R102 Pelvic and perineal pain unspecified side: Secondary | ICD-10-CM

## 2017-11-08 DIAGNOSIS — O26892 Other specified pregnancy related conditions, second trimester: Secondary | ICD-10-CM | POA: Diagnosis not present

## 2017-11-08 DIAGNOSIS — O2342 Unspecified infection of urinary tract in pregnancy, second trimester: Secondary | ICD-10-CM

## 2017-11-08 DIAGNOSIS — O99342 Other mental disorders complicating pregnancy, second trimester: Secondary | ICD-10-CM | POA: Insufficient documentation

## 2017-11-08 DIAGNOSIS — Z8719 Personal history of other diseases of the digestive system: Secondary | ICD-10-CM | POA: Diagnosis not present

## 2017-11-08 DIAGNOSIS — F419 Anxiety disorder, unspecified: Secondary | ICD-10-CM | POA: Insufficient documentation

## 2017-11-08 DIAGNOSIS — Z3A18 18 weeks gestation of pregnancy: Secondary | ICD-10-CM | POA: Diagnosis not present

## 2017-11-08 DIAGNOSIS — Z87891 Personal history of nicotine dependence: Secondary | ICD-10-CM | POA: Insufficient documentation

## 2017-11-08 DIAGNOSIS — Z79899 Other long term (current) drug therapy: Secondary | ICD-10-CM | POA: Insufficient documentation

## 2017-11-08 DIAGNOSIS — R109 Unspecified abdominal pain: Secondary | ICD-10-CM | POA: Insufficient documentation

## 2017-11-08 LAB — URINALYSIS, ROUTINE W REFLEX MICROSCOPIC
Bilirubin Urine: NEGATIVE
GLUCOSE, UA: NEGATIVE mg/dL
HGB URINE DIPSTICK: NEGATIVE
Ketones, ur: 5 mg/dL — AB
NITRITE: NEGATIVE
PH: 5 (ref 5.0–8.0)
Protein, ur: NEGATIVE mg/dL
SPECIFIC GRAVITY, URINE: 1.025 (ref 1.005–1.030)

## 2017-11-08 MED ORDER — SULFAMETHOXAZOLE-TRIMETHOPRIM 800-160 MG PO TABS
1.0000 | ORAL_TABLET | Freq: Once | ORAL | Status: AC
  Administered 2017-11-08: 1 via ORAL
  Filled 2017-11-08: qty 1

## 2017-11-08 MED ORDER — PHENAZOPYRIDINE HCL 100 MG PO TABS
200.0000 mg | ORAL_TABLET | Freq: Once | ORAL | Status: AC
  Administered 2017-11-08: 200 mg via ORAL
  Filled 2017-11-08: qty 2

## 2017-11-08 MED ORDER — SULFAMETHOXAZOLE-TRIMETHOPRIM 800-160 MG PO TABS: 1.0000 | ORAL_TABLET | Freq: Two times a day (BID) | ORAL | 0 refills | Status: AC

## 2017-11-08 MED ORDER — PHENAZOPYRIDINE HCL 200 MG PO TABS: 200.0000 mg | ORAL_TABLET | Freq: Three times a day (TID) | ORAL | 0 refills | Status: DC | PRN

## 2017-11-08 NOTE — MAU Provider Note (Signed)
Chief Complaint: Pelvic Pain   First Provider Initiated Contact with Patient 11/08/17 2214      SUBJECTIVE HPI: Valerie [name] is a 27 y.o. [id number] at 1958w5d by LMP who presents to maternity admissions reporting onset this morning of cramping lower abdominal pain, worsening over the course of the day.  The pain is now sharp constant lower abdomen/suprapubic pain that radiates down into the vagina and rectum. There is associated urinary frequency but no dysuria. There are no other associated symptoms. She is currently treating a yeast infection with topical vaginal cream. She has not tried any other treatments.   HPI  Past Medical History:  Diagnosis Date  . Abnormal Pap smear   . Anemia   . Anxiety   . Eczema   . Gastric ulcer    2000  . Infection    UTI, chronic sinus inf  . Migraine   . Ovarian cyst   . UTI (urinary tract infection)   . Vaginal Pap smear, abnormal    Past Surgical History:  Procedure Laterality Date  . ADENOIDECTOMY    . COLPOSCOPY    . CRYOTHERAPY    . MYRINGOTOMY WITH TUBE PLACEMENT     Social History   Socioeconomic History  . Marital status: Legally Separated    Spouse name: Not on file  . Number of children: Not on file  . Years of education: Not on file  . Highest education level: Not on file  Occupational History  . Not on file  Social Needs  . Financial resource strain: Not on file  . Food insecurity:    Worry: Not on file    Inability: Not on file  . Transportation needs:    Medical: Not on file    Non-medical: Not on file  Tobacco Use  . Smoking status: Former Smoker    Packs/day: 0.25    Types: Cigarettes    Last attempt to quit: 08/05/2017    Years since quitting: 0.2  . Smokeless tobacco: Never Used  Substance and Sexual Activity  . Alcohol use: Never    Frequency: Never  . Drug use: No  . Sexual activity: Yes    Partners: Male    Birth control/protection: None  Lifestyle  . Physical activity:    Days per week: Not on  file    Minutes per session: Not on file  . Stress: Not on file  Relationships  . Social connections:    Talks on phone: Not on file    Gets together: Not on file    Attends religious service: Not on file    Active member of club or organization: Not on file    Attends meetings of clubs or organizations: Not on file    Relationship status: Not on file  . Intimate partner violence:    Fear of current or ex partner: Not on file    Emotionally abused: Not on file    Physically abused: Not on file    Forced sexual activity: Not on file  Other Topics Concern  . Not on file  Social History Narrative  . Not on file   No current facility-administered medications on file prior to encounter.    Current Outpatient Medications on File Prior to Encounter  Medication Sig Dispense Refill  . acetaminophen (TYLENOL) 500 MG tablet Take 1,000 mg by mouth every 6 (six) hours as needed for headache (headache). Reported on 04/04/2015    . docusate sodium (COLACE) 100 MG capsule Take 100  mg by mouth daily as needed for mild constipation.    . Miconazole Nitrate (MONISTAT 7 VA) Place vaginally.    . ondansetron (ZOFRAN ODT) 4 MG disintegrating tablet Take 1 tablet (4 mg total) by mouth every 6 (six) hours as needed for nausea. 20 tablet 2  . Prenatal Vit-Fe Fumarate-FA (PRENATAL MULTIVITAMIN) TABS tablet Take 1 tablet by mouth daily at 12 noon.    . promethazine (PHENERGAN) 12.5 MG tablet Take 12.5 mg by mouth every 6 (six) hours as needed for nausea or vomiting.    . clotrimazole (GYNE-LOTRIMIN) 1 % vaginal cream Place 1 Applicatorful vaginally at bedtime. 30 g 2   Allergies  Allergen Reactions  . Augmentin [Amoxicillin-Pot Clavulanate] Diarrhea and Nausea And Vomiting       . Ciprofloxacin Hives  . Doxycycline Hives  . Hydrocodone Hives  . Prednisone     Had a reaction to high doses and was told not to take again  . Raspberry Swelling    Throat swelling    ROS:  Review of Systems   Constitutional: Negative for chills, fatigue and fever.  Eyes: Negative for visual disturbance.  Respiratory: Negative for shortness of breath.   Cardiovascular: Negative for chest pain.  Gastrointestinal: Positive for abdominal pain. Negative for nausea and vomiting.  Genitourinary: Positive for frequency and pelvic pain. Negative for difficulty urinating, dysuria, flank pain, vaginal bleeding, vaginal discharge and vaginal pain.  Neurological: Negative for dizziness and headaches.  Psychiatric/Behavioral: Negative.      I have reviewed patient's Past Medical Hx, Surgical Hx, Family Hx, Social Hx, medications and allergies.   Physical Exam   Patient Vitals for the past 24 hrs:  BP Temp Pulse Resp Height Weight  11/08/17 2028 121/68 98.2 F (36.8 C) 84 18 5\' 4"  (1.626 m) 89.4 kg   Constitutional: Well-developed, well-nourished female in no acute distress.  Cardiovascular: normal rate Respiratory: normal effort GI: Abd soft, non-tender. Pos BS x 4 MS: Extremities nontender, no edema, normal ROM Neurologic: Alert and oriented x 4.  GU: Neg CVAT.  Dilation: Closed Effacement (%): Thick Cervical Position: Posterior Exam by:: L Leftwich-Kirby CNM  FHT 150 by doppler  LAB RESULTS Results for orders placed or performed during the hospital encounter of 11/08/17 (from the past 24 hour(s))  Urinalysis, Routine w reflex microscopic     Status: Abnormal   Collection Time: 11/08/17  8:35 PM  Result Value Ref Range   Color, Urine YELLOW YELLOW   APPearance CLOUDY (A) CLEAR   Specific Gravity, Urine 1.025 1.005 - 1.030   pH 5.0 5.0 - 8.0   Glucose, UA NEGATIVE NEGATIVE mg/dL   Hgb urine dipstick NEGATIVE NEGATIVE   Bilirubin Urine NEGATIVE NEGATIVE   Ketones, ur 5 (A) NEGATIVE mg/dL   Protein, ur NEGATIVE NEGATIVE mg/dL   Nitrite NEGATIVE NEGATIVE   Leukocytes, UA MODERATE (A) NEGATIVE   RBC / HPF 0-5 0 - 5 RBC/hpf   WBC, UA 0-5 0 - 5 WBC/hpf   Bacteria, UA RARE (A) NONE SEEN    Squamous Epithelial / LPF 11-20 0 - 5   Mucus PRESENT     O/Positive/-- (07/22 1617)  IMAGING No results found.  MAU Management/MDM: Cervix closed, no evidence of preterm labor.  UA with moderate leuks, pt symptoms c/w UTI.  Treat with Bactrim DS BID x 7 days. Pyridium TID PRN. F/U in office as scheduled. Return to MAU with signs of labor or emergencies.   Pt discharged with strict preterm labor/miscarriage precautions.  ASSESSMENT 1. Abdominal pain during pregnancy in second trimester   2. Vaginal pain   3. UTI (urinary tract infection) during pregnancy, second trimester     PLAN Discharge home Allergies as of 11/08/2017      Reactions   Augmentin [amoxicillin-pot Clavulanate] Diarrhea, Nausea And Vomiting       Ciprofloxacin Hives   Doxycycline Hives   Hydrocodone Hives   Prednisone    Had a reaction to high doses and was told not to take again   Raspberry Swelling   Throat swelling      Medication List    TAKE these medications   acetaminophen 500 MG tablet Commonly known as:  TYLENOL Take 1,000 mg by mouth every 6 (six) hours as needed for headache (headache). Reported on 04/04/2015   clotrimazole 1 % vaginal cream Commonly known as:  GYNE-LOTRIMIN Place 1 Applicatorful vaginally at bedtime.   docusate sodium 100 MG capsule Commonly known as:  COLACE Take 100 mg by mouth daily as needed for mild constipation.   MONISTAT 7 VA Place vaginally.   ondansetron 4 MG disintegrating tablet Commonly known as:  ZOFRAN-ODT Take 1 tablet (4 mg total) by mouth every 6 (six) hours as needed for nausea.   phenazopyridine 200 MG tablet Commonly known as:  PYRIDIUM Take 1 tablet (200 mg total) by mouth 3 (three) times daily as needed for pain.   prenatal multivitamin Tabs tablet Take 1 tablet by mouth daily at 12 noon.   promethazine 12.5 MG tablet Commonly known as:  PHENERGAN Take 12.5 mg by mouth every 6 (six) hours as needed for nausea or vomiting.    sulfamethoxazole-trimethoprim 800-160 MG tablet Commonly known as:  BACTRIM DS,SEPTRA DS Take 1 tablet by mouth 2 (two) times daily for 7 days.      Follow-up Information    [facility] Follow up.   Why:  As scheduled, return to MAU if symptoms worsen or for emergencies Contact information: [address] [place] [address] [phone]          Sharen Counter Certified Nurse-Midwife 11/08/2017  10:40 PM

## 2017-11-08 NOTE — MAU Note (Signed)
This am was feeliing alittle crampy and has gotten worse. Pain worse with movement like walking and getting up. Think I may be having Braxton Hicks ctxs but nothing regular. I have alittle vag d/c but currently be treated for yeast -using OTC vaginal cream and today is 3rd day

## 2017-11-08 NOTE — MAU Note (Addendum)
Pt reports cramping and pelvic pressure since this morning. Denies bleeding or LOF. Pt reports urinary frequency, but denies dysuria. BM yesterday. Taking colace. Pt had 2 days of monistat for yeast diagnosed on 8/22

## 2017-11-10 ENCOUNTER — Other Ambulatory Visit: Payer: Self-pay | Admitting: Obstetrics and Gynecology

## 2017-11-10 ENCOUNTER — Ambulatory Visit (HOSPITAL_COMMUNITY)
Admission: RE | Admit: 2017-11-10 | Discharge: 2017-11-10 | Disposition: A | Discharge status: Home / Self Care | Payer: Medicaid Other | Source: Ambulatory Visit | Attending: Obstetrics and Gynecology | Admitting: Obstetrics and Gynecology

## 2017-11-10 ENCOUNTER — Other Ambulatory Visit (HOSPITAL_COMMUNITY): Payer: Self-pay | Admitting: *Deleted

## 2017-11-10 ENCOUNTER — Encounter (HOSPITAL_COMMUNITY): Payer: Self-pay

## 2017-11-10 DIAGNOSIS — O99412 Diseases of the circulatory system complicating pregnancy, second trimester: Secondary | ICD-10-CM | POA: Diagnosis not present

## 2017-11-10 DIAGNOSIS — O352XX0 Maternal care for (suspected) hereditary disease in fetus, not applicable or unspecified: Secondary | ICD-10-CM | POA: Diagnosis not present

## 2017-11-10 DIAGNOSIS — O09292 Supervision of pregnancy with other poor reproductive or obstetric history, second trimester: Secondary | ICD-10-CM | POA: Insufficient documentation

## 2017-11-10 DIAGNOSIS — Z362 Encounter for other antenatal screening follow-up: Secondary | ICD-10-CM

## 2017-11-10 DIAGNOSIS — O99212 Obesity complicating pregnancy, second trimester: Secondary | ICD-10-CM

## 2017-11-10 DIAGNOSIS — I440 Atrioventricular block, first degree: Secondary | ICD-10-CM | POA: Insufficient documentation

## 2017-11-10 DIAGNOSIS — O0992 Supervision of high risk pregnancy, unspecified, second trimester: Secondary | ICD-10-CM

## 2017-11-10 DIAGNOSIS — Z3A19 19 weeks gestation of pregnancy: Secondary | ICD-10-CM

## 2017-11-10 DIAGNOSIS — E669 Obesity, unspecified: Secondary | ICD-10-CM | POA: Diagnosis not present

## 2017-11-10 DIAGNOSIS — I519 Heart disease, unspecified: Secondary | ICD-10-CM

## 2017-11-10 DIAGNOSIS — Z363 Encounter for antenatal screening for malformations: Secondary | ICD-10-CM | POA: Diagnosis not present

## 2017-11-10 DIAGNOSIS — O359XX0 Maternal care for (suspected) fetal abnormality and damage, unspecified, not applicable or unspecified: Secondary | ICD-10-CM | POA: Diagnosis not present

## 2017-11-10 HISTORY — DX: Atrioventricular block, first degree: I44.0

## 2017-11-10 LAB — CULTURE, OB URINE

## 2017-11-10 MED ORDER — METRONIDAZOLE 500 MG PO TABS: 500.0000 mg | ORAL_TABLET | Freq: Two times a day (BID) | ORAL | 0 refills | Status: DC

## 2017-11-10 NOTE — Addendum Note (Signed)
Addended by: Leroy LibmanAVIS, Lot Medford on: 11/10/2017 02:39 PM   Modules accepted: Orders

## 2017-11-10 NOTE — Consult Note (Signed)
Consultation:   Valerie [name] is a 27 yo Caucasian female, G 4 P 1021 LMP 06/30/17 EDC 04/06/18 ow  @ 19 weeks seen in consultation as requested secondary to:  1) EIF found on U/S today    PREVIOUS OBSTETRICAL HISTORY:  1) Age 89 - First trimester spontaneous abortion without D&C 2) Age 73 - NSVD @ term, female, 6 lb 4 oz, complicated by PP depression 3) Age 47 - First trimester spontaneous abortion without D&C     PREVIOUS GYN HISTORY:   Abnormal PAP - LEEP age 1   GC - neg   Chlamydia - neg    Syphilis - neg   CAT - 12 x 28-30 x 4-5   Contraception - none    PREVIOUS MEDICAL HISTORY:  DM - neg   HTN - neg   Asthma - neg   Thyroid - neg   Rheumatic Fever - neg    Heart -2019 - First degree heart block   Lung - neg   Liver - neg   Kidney - neg   Epilepsy - neg    TB - neg   Herpes - neg   UTI - 6-7        PREVIOUS SURGICAL HISTORY:  1) Age 19 - Adenoidectomy and myringotomy tubes without complications    MEDICATIONS:  Prenatal Vitamins, Tylenol PRN, Phenergan 25 mg 0.5 tab PRN, Colace 100 mg BID, Zofran 8 mg PRN        ALLERGIES/REACTIONS:  Au, Cipro - hives, Doxycyline - nausea, Hydrocodone - hives, raspberry - sore throat      HABITS:  Smoking - neg   Drinking - neg   Drugs - neg    PSYCHOSOCIAL:  Separated; FOB is involved      PROFESSION:  Student - nursing    FAMILY HISTORY:  DM - F, GM, U   HTN - F, GM, GF, U   Twins - neg   Stillborns - A    Birth Defects - neg   Mental Retardation - C   Blood Dyscrasias - neg   Anesthesia Complications - neg    Genetic - neg  F, U with schizophrenia      ECHOGENIC INTRACARDIAC FOCUS:  General counseling was performed regarding echogenic intracardiac focus/foci (EIF) was performed.  EIF is seen in 3-4% of normal fetuses in the second trimester.  The prevalence appears to be significantly higher in Asian populations.  The likelihood ratio of EIF for Trisomy 21 is estimated in the range of 1.8 - 4.2 (times the age  related risk).  Multiple or large EIF may further increase the risk of aneuploidy. Patient's age related risk is 1/380.  NIPT testing reduced the risk to less than 1/10,000.   SCHIZOPHRENIA: Schizophrenia, a debilitating psychological disorder, is caused by a multitude of factors. Part genetic, part environmental, part developmental and part unknown variable, schizophrenia affects approximately one percent of the population. Izola Price, 2007) This number does not include all the friends, family member, coworkers and others who are affected from being around someone with this type of disease. Though heavily studied throughout the world, schizophrenia still remains a mystery to psychological researchers.   Schizophrenia symptoms usually begin during progression through the young adult years. Both positive and negative symptoms to the disease exist making it difficult to find a one-size-fits-all style of treatment. Positive symptoms, easily recognizable, include hallucinations and delusions, disorganized thinking and nearly catatonic states. Negative symptoms are not the opposite of positive symptoms. On  the contrary, negative symptoms are more in tune with typical depression symptoms. These include rarely speaking, inability to enjoy life, a static type of emotional responsiveness, and others.   Psychologists separate schizophrenia patients into 5 subtypes: paranoid, disorganized, catatonic, undifferentiated, and residual. Paranoid schizophrenia is classified by the presence of hallucinations and/or delusions. Disorganized schizophrenia show signs of "disorganized speech or behavior." Izola Price(Myers, 2007) Catatonic schizophrenia is not just immobility, as the name suggests, but mimicking other people and being exceptionally negative. Undifferentiated schizophrenia includes both positive and negative symptoms without psychological preference. Lastly, residual schizophrenia is characterized by "withdrawal, after  hallucinations and delusions have disappeared." Izola Price(Myers, 2007) Schizophrenia patients exhibit a variety of symptoms and, therefore, cannot be classified under just one umbrella.  Schizophrenia also has genetic factors. Those patients with family members who have schizophrenia are at an increased risk of developing the debilitating disease. Although some specific genes have been suspected, it seems as though predisposition is not nearly enough to guarantee schizophrenia later in life. It seems as though many factors must come together to become schizophrenic. This is not merely a simple disease.   IMPRESSIONS:  1) Isolated EIF 2) Family h/o Schizophrenia 3) Maternal 1st degree heart block (not discussed)    RECOMMENDATIONS:  1) Completion of anatomy in 4 weeks            30 minutes spent in face-to-face consultation with greater than 50% of the time spent in counseling.    Thank you for utilizing our ultrasound and consultative services.  If I may be of any further service, please do not hesitate to contact me.  Sincerely,   Patsi Searsobert L. Darl Brisbin, MD Maternal-Fetal Medicine   Copy of report sent to practitioner/clinic.

## 2017-11-12 ENCOUNTER — Other Ambulatory Visit: Payer: Self-pay | Admitting: Obstetrics

## 2017-11-12 ENCOUNTER — Other Ambulatory Visit (HOSPITAL_BASED_OUTPATIENT_CLINIC_OR_DEPARTMENT_OTHER): Payer: Medicaid Other

## 2017-11-12 DIAGNOSIS — N760 Acute vaginitis: Principal | ICD-10-CM

## 2017-11-12 DIAGNOSIS — B9689 Other specified bacterial agents as the cause of diseases classified elsewhere: Secondary | ICD-10-CM

## 2017-11-12 MED ORDER — METRONIDAZOLE 0.75 % VA GEL: 1.0000 | Freq: Two times a day (BID) | VAGINAL | 2 refills | Status: DC

## 2017-11-13 ENCOUNTER — Ambulatory Visit: Payer: Medicaid Other

## 2017-11-13 DIAGNOSIS — R002 Palpitations: Secondary | ICD-10-CM

## 2017-11-13 DIAGNOSIS — R011 Cardiac murmur, unspecified: Secondary | ICD-10-CM

## 2017-11-13 DIAGNOSIS — R001 Bradycardia, unspecified: Secondary | ICD-10-CM

## 2017-11-13 DIAGNOSIS — I440 Atrioventricular block, first degree: Secondary | ICD-10-CM

## 2017-11-13 NOTE — Progress Notes (Signed)
Pt advised of results and results faxed to OB.

## 2017-11-14 ENCOUNTER — Telehealth: Payer: Self-pay | Admitting: *Deleted

## 2017-11-14 NOTE — Telephone Encounter (Signed)
Pt would like someone to call her and go over results of echocardiogram with her. [name] let her know they were normal but when she looked at the report on my chart saw some things that looked concerning to her. Please advise.

## 2017-11-18 NOTE — Telephone Encounter (Signed)
Left message per DPR advising patient that Dr. Tomie China is waiting for the holter monitor results at this time and if she has further issues or concerns, she should follow up with her primary care physician and/or her obstetrician. Informed her that she could contact our office if she had further questions.

## 2017-11-18 NOTE — Telephone Encounter (Signed)
Spoke with patient regarding echocardiogram results. Patient and patient's mother state that she has been experiencing shortness of breath while up and walking around the house. Advised patient that I would make Dr. Tomie China aware and let them know if he has any further recommendations at this time. Advised patient to call us if shortness of breath worsens and go to the nearest emergency department if her symptoms become severe. Patient verbalized understanding. No further questions.

## 2017-11-18 NOTE — Telephone Encounter (Signed)
Left message to return call 

## 2017-12-02 ENCOUNTER — Telehealth: Payer: Self-pay

## 2017-12-02 ENCOUNTER — Other Ambulatory Visit: Payer: Self-pay

## 2017-12-02 ENCOUNTER — Inpatient Hospital Stay (HOSPITAL_COMMUNITY)
Admission: AD | Admit: 2017-12-02 | Discharge: 2017-12-02 | Disposition: A | Discharge status: Home / Self Care | Payer: Medicaid Other | Source: Ambulatory Visit | Attending: Obstetrics & Gynecology | Admitting: Obstetrics & Gynecology

## 2017-12-02 ENCOUNTER — Encounter (HOSPITAL_COMMUNITY): Payer: Self-pay | Admitting: *Deleted

## 2017-12-02 DIAGNOSIS — B3731 Acute candidiasis of vulva and vagina: Secondary | ICD-10-CM

## 2017-12-02 DIAGNOSIS — F419 Anxiety disorder, unspecified: Secondary | ICD-10-CM | POA: Diagnosis not present

## 2017-12-02 DIAGNOSIS — O98812 Other maternal infectious and parasitic diseases complicating pregnancy, second trimester: Secondary | ICD-10-CM | POA: Insufficient documentation

## 2017-12-02 DIAGNOSIS — O99342 Other mental disorders complicating pregnancy, second trimester: Secondary | ICD-10-CM | POA: Insufficient documentation

## 2017-12-02 DIAGNOSIS — I440 Atrioventricular block, first degree: Secondary | ICD-10-CM | POA: Insufficient documentation

## 2017-12-02 DIAGNOSIS — Z87891 Personal history of nicotine dependence: Secondary | ICD-10-CM | POA: Insufficient documentation

## 2017-12-02 DIAGNOSIS — Z8719 Personal history of other diseases of the digestive system: Secondary | ICD-10-CM | POA: Diagnosis not present

## 2017-12-02 DIAGNOSIS — N898 Other specified noninflammatory disorders of vagina: Secondary | ICD-10-CM | POA: Diagnosis present

## 2017-12-02 DIAGNOSIS — O99352 Diseases of the nervous system complicating pregnancy, second trimester: Secondary | ICD-10-CM | POA: Diagnosis not present

## 2017-12-02 DIAGNOSIS — O99412 Diseases of the circulatory system complicating pregnancy, second trimester: Secondary | ICD-10-CM | POA: Diagnosis not present

## 2017-12-02 DIAGNOSIS — G43909 Migraine, unspecified, not intractable, without status migrainosus: Secondary | ICD-10-CM | POA: Diagnosis not present

## 2017-12-02 DIAGNOSIS — Z3A22 22 weeks gestation of pregnancy: Secondary | ICD-10-CM | POA: Insufficient documentation

## 2017-12-02 DIAGNOSIS — I081 Rheumatic disorders of both mitral and tricuspid valves: Secondary | ICD-10-CM | POA: Diagnosis not present

## 2017-12-02 DIAGNOSIS — B373 Candidiasis of vulva and vagina: Secondary | ICD-10-CM | POA: Diagnosis not present

## 2017-12-02 HISTORY — DX: Cardiac murmur, unspecified: R01.1

## 2017-12-02 HISTORY — DX: Rheumatic tricuspid insufficiency: I07.1

## 2017-12-02 HISTORY — DX: Nonrheumatic mitral (valve) insufficiency: I34.0

## 2017-12-02 LAB — WET PREP, GENITAL
Clue Cells Wet Prep HPF POC: NONE SEEN
SPERM: NONE SEEN
Trich, Wet Prep: NONE SEEN

## 2017-12-02 LAB — URINALYSIS, ROUTINE W REFLEX MICROSCOPIC
GLUCOSE, UA: NEGATIVE mg/dL
HGB URINE DIPSTICK: NEGATIVE
Ketones, ur: 20 mg/dL — AB
NITRITE: NEGATIVE
PH: 5 (ref 5.0–8.0)
Protein, ur: 30 mg/dL — AB
SPECIFIC GRAVITY, URINE: 1.027 (ref 1.005–1.030)

## 2017-12-02 MED ORDER — TERCONAZOLE 0.4 % VA CREA: 1.0000 | TOPICAL_CREAM | Freq: Every day | VAGINAL | 0 refills | Status: DC

## 2017-12-02 NOTE — MAU Provider Note (Signed)
History     CSN: [id number]  Arrival date and time: 12/02/17 1251   First Provider Initiated Contact with Patient 12/02/17 1324      Chief Complaint  Patient presents with  . Vaginal Discharge   Valerie [name] is a 27 y.o. [phone] at [redacted]w[redacted]d presenting with vaginal discharge.  She states that today she had stringy mucus discharge that looked like mucous plug.Marland Kitchen No vaginal pain but some mild pruritis.  She gets frequent BV infection. Denies abdominal pain at present but felt some crampy abdominal pain earlier prior to her bowel movement.  Relieved by bowel movement.  She has taken no pain meds.  Last intercourse was several days ago.  Denies leakage of fluid or vaginal bleeding.  Good fetal movement.     OB History  Gravida Para Term Preterm AB Living  4 1 1   2 1   SAB TAB Ectopic Multiple Live Births  2       1    # Outcome Date GA Lbr Len/2nd Weight Sex Delivery Anes PTL Lv  4 Current           3 Term 07/07/13 [redacted]w[redacted]d 10:05 / 01:11 2850 g M Vag-Spont EPI  LIV  2 SAB 02/16/12 [redacted]w[redacted]d         1 SAB 12/17/03 [redacted]w[redacted]d            Past Medical History:  Diagnosis Date  . Abnormal Pap smear   . Anemia   . Anxiety   . Eczema   . First degree heart block   . First degree heart block   . Gastric ulcer    2000  . Heart murmur   . Infection    UTI, chronic sinus inf  . Migraine   . Mitral valve regurgitation   . Ovarian cyst   . Tricuspid valve regurgitation   . UTI (urinary tract infection)   . Vaginal Pap smear, abnormal     Past Surgical History:  Procedure Laterality Date  . ADENOIDECTOMY    . COLPOSCOPY    . COLPOSCOPY    . CRYOTHERAPY    . MYRINGOTOMY WITH TUBE PLACEMENT      Family History  Problem Relation Age of Onset  . Hypertension Mother   . Diabetes Father   . Hypertension Father   . Cancer Paternal Aunt        female  . Diabetes Paternal Uncle   . Diabetes Paternal Grandmother   . Hypertension Paternal Grandmother   . Diabetes Paternal Grandfather   .  Hypertension Paternal Grandfather     Social History   Tobacco Use  . Smoking status: Former Smoker    Packs/day: 0.25    Types: Cigarettes    Last attempt to quit: 08/05/2017    Years since quitting: 0.3  . Smokeless tobacco: Never Used  Substance Use Topics  . Alcohol use: Never    Frequency: Never  . Drug use: No    Allergies:  Allergies  Allergen Reactions  . Augmentin [Amoxicillin-Pot Clavulanate] Diarrhea and Nausea And Vomiting       . Ciprofloxacin Hives  . Doxycycline Hives  . Hydrocodone Hives  . Prednisone     Had a reaction to high doses and was told not to take again  . Raspberry Swelling    Throat swelling    Medications Prior to Admission  Medication Sig Dispense Refill Last Dose  . acetaminophen (TYLENOL) 500 MG tablet Take 1,000 mg by mouth every 6 (  six) hours as needed for headache (headache). Reported on 04/04/2015   12/01/2017 at Unknown time  . docusate sodium (COLACE) 100 MG capsule Take 100 mg by mouth daily as needed for mild constipation.   12/02/2017 at Unknown time  . ondansetron (ZOFRAN ODT) 4 MG disintegrating tablet Take 1 tablet (4 mg total) by mouth every 6 (six) hours as needed for nausea. 20 tablet 2 12/01/2017 at Unknown time  . Prenatal Vit-Fe Fumarate-FA (PRENATAL MULTIVITAMIN) TABS tablet Take 1 tablet by mouth daily at 12 noon.   12/01/2017 at Unknown time  . promethazine (PHENERGAN) 12.5 MG tablet Take 12.5 mg by mouth every 6 (six) hours as needed for nausea or vomiting.   12/01/2017 at Unknown time  . clotrimazole (GYNE-LOTRIMIN) 1 % vaginal cream Place 1 Applicatorful vaginally at bedtime. (Patient not taking: Reported on 11/10/2017) 30 g 2 Not Taking  . metroNIDAZOLE (METROGEL VAGINAL) 0.75 % vaginal gel Place 1 Applicatorful vaginally 2 (two) times daily. 70 g 2   . Miconazole Nitrate (MONISTAT 7 VA) Place vaginally.   Not Taking  . phenazopyridine (PYRIDIUM) 200 MG tablet Take 1 tablet (200 mg total) by mouth 3 (three) times daily as  needed for pain. 12 tablet 0 Taking    Review of Systems  Constitutional: Negative for fatigue and fever.  Respiratory: Positive for shortness of breath. Negative for chest tightness.   Gastrointestinal: Positive for constipation. Negative for abdominal pain, diarrhea and nausea.       Abdominal discomfort resolved after BM today  Genitourinary: Positive for vaginal discharge. Negative for dysuria, pelvic pain, urgency, vaginal bleeding and vaginal pain.  Neurological: Negative for dizziness and light-headedness.  Psychiatric/Behavioral: Negative for agitation. The patient is not nervous/anxious.    Physical Exam   Blood pressure 110/75, pulse 99, temperature 98.5 F (36.9 C), temperature source Oral, resp. rate 17, weight 87.1 kg, last menstrual period 06/30/2017, SpO2 100 %.  Physical Exam  Nursing note and vitals reviewed. Constitutional: She is oriented to person, place, and time. She appears well-developed and well-nourished. No distress.  HENT:  Head: Normocephalic.  Eyes: No scleral icterus.  Neck: Neck supple.  Cardiovascular: Normal rate.  GI: Soft. There is no tenderness.  DT FHR 154 Fundus at u+76fb  Genitourinary:  Genitourinary Comments: Ext genitalia: vulva and surrounding skin with pink discoloration, no lesions Vagina: moderate homogenous white discharge Cx: multiparous, no lesions: long/closed   Musculoskeletal: Normal range of motion.  Neurological: She is alert and oriented to person, place, and time.  Skin: Skin is warm and dry.    MAU Course  Procedures Results for orders placed or performed during the hospital encounter of 12/02/17 (from the past 24 hour(s))  Urinalysis, Routine w reflex microscopic     Status: Abnormal   Collection Time: 12/02/17  1:11 PM  Result Value Ref Range   Color, Urine AMBER (A) YELLOW   APPearance CLOUDY (A) CLEAR   Specific Gravity, Urine 1.027 1.005 - 1.030   pH 5.0 5.0 - 8.0   Glucose, UA NEGATIVE NEGATIVE mg/dL   Hgb  urine dipstick NEGATIVE NEGATIVE   Bilirubin Urine SMALL (A) NEGATIVE   Ketones, ur 20 (A) NEGATIVE mg/dL   Protein, ur 30 (A) NEGATIVE mg/dL   Nitrite NEGATIVE NEGATIVE   Leukocytes, UA TRACE (A) NEGATIVE   RBC / HPF 0-5 0 - 5 RBC/hpf   WBC, UA 0-5 0 - 5 WBC/hpf   Bacteria, UA RARE (A) NONE SEEN   Squamous Epithelial / LPF 21-50 0 -  5   Mucus PRESENT   Wet prep, genital     Status: Abnormal   Collection Time: 12/02/17  1:45 PM  Result Value Ref Range   Yeast Wet Prep HPF POC PRESENT (A) NONE SEEN   Trich, Wet Prep NONE SEEN NONE SEEN   Clue Cells Wet Prep HPF POC NONE SEEN NONE SEEN   WBC, Wet Prep HPF POC MANY (A) NONE SEEN   Sperm NONE SEEN    Reassured no PTL signs. Advised to increase fluids and adjust diet due to constipation and mild dehydration.   Assessment and Plan  Yeast vaginitis  Allergies as of 12/02/2017      Reactions   Augmentin [amoxicillin-pot Clavulanate] Diarrhea, Nausea And Vomiting       Ciprofloxacin Hives   Doxycycline Hives   Hydrocodone Hives   Prednisone    Had a reaction to high doses and was told not to take again   Raspberry Swelling   Throat swelling      Medication List    STOP taking these medications   clotrimazole 1 % vaginal cream Commonly known as:  GYNE-LOTRIMIN   metroNIDAZOLE 0.75 % vaginal gel Commonly known as:  METROGEL   MONISTAT 7 VA   phenazopyridine 200 MG tablet Commonly known as:  PYRIDIUM     TAKE these medications   acetaminophen 500 MG tablet Commonly known as:  TYLENOL Take 1,000 mg by mouth every 6 (six) hours as needed for headache (headache). Reported on 04/04/2015   docusate sodium 100 MG capsule Commonly known as:  COLACE Take 100 mg by mouth daily as needed for mild constipation.   ondansetron 4 MG disintegrating tablet Commonly known as:  ZOFRAN-ODT Take 1 tablet (4 mg total) by mouth every 6 (six) hours as needed for nausea.   prenatal multivitamin Tabs tablet Take 1 tablet by mouth daily at  12 noon.   promethazine 12.5 MG tablet Commonly known as:  PHENERGAN Take 12.5 mg by mouth every 6 (six) hours as needed for nausea or vomiting.   terconazole 0.4 % vaginal cream Commonly known as:  TERAZOL 7 Place 1 applicator vaginally at bedtime.      Follow-up Information    [facility]   Specialty:  Obstetrics and Gynecology Contact information: [address] [place] [id number] [phone]         Follow-up Information    [facility]   Specialty:  Obstetrics and Gynecology Contact information: [address] [place] [id number] [phone]          Deirdre Poe CNM 12/02/2017, 1:25 PM

## 2017-12-02 NOTE — Discharge Instructions (Signed)

## 2017-12-02 NOTE — MAU Note (Signed)
Noted a wad of mucous when wiped this morning.  ? Cramping. Has been constipated.  Some pressure and round ligament pain.

## 2017-12-02 NOTE — Telephone Encounter (Signed)
TC from pt stating she believes she lost her mucous plug and is c/o pressure, no contractions. Per provider in the office pt needs to go to MAU for eval Pt voiced understanding.

## 2017-12-04 ENCOUNTER — Ambulatory Visit (INDEPENDENT_AMBULATORY_CARE_PROVIDER_SITE_OTHER): Payer: Medicaid Other | Admitting: Obstetrics and Gynecology

## 2017-12-04 ENCOUNTER — Encounter: Payer: Self-pay | Admitting: Obstetrics and Gynecology

## 2017-12-04 VITALS — BP 111/76 | HR 93 | Wt 194.0 lb

## 2017-12-04 DIAGNOSIS — I440 Atrioventricular block, first degree: Secondary | ICD-10-CM

## 2017-12-04 DIAGNOSIS — O359XX0 Maternal care for (suspected) fetal abnormality and damage, unspecified, not applicable or unspecified: Secondary | ICD-10-CM

## 2017-12-04 DIAGNOSIS — Z23 Encounter for immunization: Secondary | ICD-10-CM | POA: Diagnosis not present

## 2017-12-04 DIAGNOSIS — O0992 Supervision of high risk pregnancy, unspecified, second trimester: Secondary | ICD-10-CM

## 2017-12-04 MED ORDER — PROMETHAZINE HCL 25 MG PO TABS: 25.0000 mg | ORAL_TABLET | Freq: Four times a day (QID) | ORAL | 2 refills | Status: DC | PRN

## 2017-12-04 MED ORDER — ONDANSETRON 4 MG PO TBDP: 4.0000 mg | ORAL_TABLET | Freq: Four times a day (QID) | ORAL | 2 refills | Status: DC | PRN

## 2017-12-04 NOTE — Progress Notes (Signed)
   PRENATAL VISIT NOTE  Subjective:  Valerie [name] is a 27 y.o. [phone] at 2579w3d being seen today for ongoing prenatal care.  She is currently monitored for the following issues for this high-risk pregnancy and has Former smoker, stopped smoking in distant past; Family history of diabetes in pregnancy; History of facial weakness/Neuro workup/Lesion on MRI w/out change; Skin tag of perianal region; Depressive disorder, not elsewhere classified; Supervision of high risk pregnancy, antepartum, second trimester; Heart block; Unwanted fertility; Palpitations; Cardiac murmur; First degree AV block; Hereditary disease in family possibly affecting fetus, antepartum; and Fetal abnormality affecting management of mother, antepartum on their problem list.  Patient reports no complaints.  Contractions: Not present. Vag. Bleeding: None.  Movement: Present. Denies leaking of fluid.   The following portions of the patient's history were reviewed and updated as appropriate: allergies, current medications, past family history, past medical history, past social history, past surgical history and problem list. Problem list updated.  Objective:   Vitals:   12/04/17 1039  BP: 111/76  Pulse: 93  Weight: 194 lb (88 kg)    Fetal Status: Fetal Heart Rate (bpm): 150 Fundal Height: 22 cm Movement: Present     General:  Alert, oriented and cooperative. Patient is in no acute distress.  Skin: Skin is warm and dry. No rash noted.   Cardiovascular: Normal heart rate noted  Respiratory: Normal respiratory effort, no problems with respiration noted  Abdomen: Soft, gravid, appropriate for gestational age.  Pain/Pressure: Present     Pelvic: Cervical exam deferred        Extremities: Normal range of motion.     Mental Status: Normal mood and affect. Normal behavior. Normal judgment and thought content.   Assessment and Plan:  Pregnancy: G4P1021 at 7079w3d  1. Supervision of high risk pregnancy, antepartum, second  trimester Patient is doing well Third trimester labs next visit Flu vaccine today Follow up anatomy ultrasound - ondansetron (ZOFRAN ODT) 4 MG disintegrating tablet; Take 1 tablet (4 mg total) by mouth every 6 (six) hours as needed for nausea.  Dispense: 20 tablet; Refill: 2 - Flu Vaccine QUAD 36+ mos IM (Fluarix, Quad PF)  2. Fetal abnormality affecting management of mother, antepartum, single or unspecified fetus Fetal echo today  3. First degree AV block Follow up with cardiology regarding holter monitoring results  Preterm labor symptoms and general obstetric precautions including but not limited to vaginal bleeding, contractions, leaking of fluid and fetal movement were reviewed in detail with the patient. Please refer to After Visit Summary for other counseling recommendations.  Return in about 4 weeks (around 01/01/2018) for ROB, 2 hr glucola next visit.  Future Appointments  Date Time Provider Department Center  12/08/2017  2:30 PM [facility] US 1 [facility]  01/01/2018 10:00 AM Conan Bowensavis, Kelly M, MD [facility] None    Catalina AntiguaPeggy Bert Ptacek, MD

## 2017-12-08 ENCOUNTER — Ambulatory Visit (HOSPITAL_COMMUNITY)
Admission: RE | Admit: 2017-12-08 | Discharge: 2017-12-08 | Disposition: A | Discharge status: Home / Self Care | Payer: Medicaid Other | Source: Ambulatory Visit | Attending: Obstetrics and Gynecology | Admitting: Obstetrics and Gynecology

## 2017-12-15 ENCOUNTER — Inpatient Hospital Stay (HOSPITAL_COMMUNITY)
Admission: AD | Admit: 2017-12-15 | Discharge: 2017-12-15 | Disposition: A | Discharge status: Home / Self Care | Payer: Medicaid Other | Source: Ambulatory Visit | Attending: Obstetrics and Gynecology | Admitting: Obstetrics and Gynecology

## 2017-12-15 ENCOUNTER — Telehealth: Payer: Self-pay

## 2017-12-15 ENCOUNTER — Encounter (HOSPITAL_COMMUNITY): Payer: Self-pay

## 2017-12-15 ENCOUNTER — Other Ambulatory Visit: Payer: Self-pay

## 2017-12-15 DIAGNOSIS — Z3A24 24 weeks gestation of pregnancy: Secondary | ICD-10-CM | POA: Diagnosis not present

## 2017-12-15 DIAGNOSIS — O26892 Other specified pregnancy related conditions, second trimester: Secondary | ICD-10-CM | POA: Diagnosis not present

## 2017-12-15 DIAGNOSIS — O26899 Other specified pregnancy related conditions, unspecified trimester: Secondary | ICD-10-CM

## 2017-12-15 DIAGNOSIS — Z87891 Personal history of nicotine dependence: Secondary | ICD-10-CM | POA: Insufficient documentation

## 2017-12-15 DIAGNOSIS — E860 Dehydration: Secondary | ICD-10-CM

## 2017-12-15 DIAGNOSIS — R109 Unspecified abdominal pain: Secondary | ICD-10-CM | POA: Diagnosis not present

## 2017-12-15 DIAGNOSIS — Z0371 Encounter for suspected problem with amniotic cavity and membrane ruled out: Secondary | ICD-10-CM

## 2017-12-15 LAB — URINALYSIS, ROUTINE W REFLEX MICROSCOPIC
Bilirubin Urine: NEGATIVE
Glucose, UA: NEGATIVE mg/dL
Hgb urine dipstick: NEGATIVE
Ketones, ur: 80 mg/dL — AB
Leukocytes, UA: NEGATIVE
NITRITE: NEGATIVE
PH: 5 (ref 5.0–8.0)
Protein, ur: NEGATIVE mg/dL
SPECIFIC GRAVITY, URINE: 1.025 (ref 1.005–1.030)

## 2017-12-15 LAB — WET PREP, GENITAL
CLUE CELLS WET PREP: NONE SEEN
SPERM: NONE SEEN
TRICH WET PREP: NONE SEEN
Yeast Wet Prep HPF POC: NONE SEEN

## 2017-12-15 LAB — AMNISURE RUPTURE OF MEMBRANE (ROM) NOT AT ARMC: Amnisure ROM: NEGATIVE

## 2017-12-15 NOTE — MAU Provider Note (Signed)
History     CSN: [id number]  Arrival date and time: 12/15/17 1700   None     Chief Complaint  Patient presents with  . Abdominal Pain  . Vaginal Discharge   HPI: Valerie [name] is a 27 y.o. [id number] female at [redacted]w[redacted]d gestation. She is presenting for with a chief complaint of concerns about losing her mucous plug this morning. Patient states she had a quarter sized piece of mucous followed by a dime sized piece. She reports that it was "snot-liike and clear with a yellowish, tan tint". She also reports abdominal cramping that started this morning. Patient states she has had diarrhea 4 times today. She denies contractions, vaginal bleeding, nausea, vomiting, and fever. She states "I just don't feel good and I've felt like I would deliver early since the beginning". She reports a history of "urine leakage" and reports she is unable to tell if she experienced a gush of fluid due to this. Patient reports a history of migraines and reports an "on and off" headache that improves with Tylenol. She reports sexual intercourse at approximately 0700 yesterday.   OB History    Gravida  4   Para  1   Term  1   Preterm      AB  2   Living  1     SAB  2   TAB      Ectopic      Multiple      Live Births  1           Past Medical History:  Diagnosis Date  . Abnormal Pap smear   . Anemia   . Anxiety   . Eczema   . First degree heart block   . First degree heart block   . Gastric ulcer    2000  . Heart murmur   . Infection    UTI, chronic sinus inf  . Migraine   . Mitral valve regurgitation   . Ovarian cyst   . Tricuspid valve regurgitation   . UTI (urinary tract infection)   . Vaginal Pap smear, abnormal     Past Surgical History:  Procedure Laterality Date  . ADENOIDECTOMY    . COLPOSCOPY    . COLPOSCOPY    . CRYOTHERAPY    . MYRINGOTOMY WITH TUBE PLACEMENT      Family History  Problem Relation Age of Onset  . Hypertension Mother   . Diabetes Father   .  Hypertension Father   . Cancer Paternal Aunt        female  . Diabetes Paternal Uncle   . Diabetes Paternal Grandmother   . Hypertension Paternal Grandmother   . Diabetes Paternal Grandfather   . Hypertension Paternal Grandfather     Social History   Tobacco Use  . Smoking status: Former Smoker    Packs/day: 0.25    Types: Cigarettes    Last attempt to quit: 08/05/2017    Years since quitting: 0.3  . Smokeless tobacco: Never Used  Substance Use Topics  . Alcohol use: Never    Frequency: Never  . Drug use: No    Allergies:  Allergies  Allergen Reactions  . Augmentin [Amoxicillin-Pot Clavulanate] Diarrhea and Nausea And Vomiting       . Ciprofloxacin Hives  . Doxycycline Hives  . Hydrocodone Hives  . Prednisone     Had a reaction to high doses and was told not to take again  . Raspberry Swelling    Throat  swelling    Medications Prior to Admission  Medication Sig Dispense Refill Last Dose  . acetaminophen (TYLENOL) 500 MG tablet Take 1,000 mg by mouth every 6 (six) hours as needed for headache (headache). Reported on 04/04/2015   12/01/2017 at Unknown time  . docusate sodium (COLACE) 100 MG capsule Take 100 mg by mouth daily as needed for mild constipation.   12/02/2017 at Unknown time  . ondansetron (ZOFRAN ODT) 4 MG disintegrating tablet Take 1 tablet (4 mg total) by mouth every 6 (six) hours as needed for nausea. 20 tablet 2   . Prenatal Vit-Fe Fumarate-FA (PRENATAL MULTIVITAMIN) TABS tablet Take 1 tablet by mouth daily at 12 noon.   12/01/2017 at Unknown time  . promethazine (PHENERGAN) 25 MG tablet Take 1 tablet (25 mg total) by mouth every 6 (six) hours as needed for nausea or vomiting. 30 tablet 2   . terconazole (TERAZOL 7) 0.4 % vaginal cream Place 1 applicator vaginally at bedtime. 45 g 0     Review of Systems  Constitutional: Negative for chills and fever.  Gastrointestinal: Positive for abdominal pain (cramping) and diarrhea (4x today). Negative for nausea and  vomiting.  Neurological: Positive for headaches.   Physical Exam   Blood pressure 122/72, pulse 100, resp. rate 18, height 5\' 4"  (1.626 m), weight 87.1 kg, last menstrual period 06/30/2017.  Physical Exam  Pelvic exam: external genitalia without lesions or erythema. Speculum exam revealed scant amounts of white discharge. Specimens were taken for a wet prep, fern test, and fFP. fFP was not sent due to cervical exam.   Dilation: Closed Effacement (%): Thick Cervical Position: Posterior Exam by:: Rasch, NP  MAU Course  Procedures  MDM UA revealed elevated ketones suggesting dehydration. Oral rehydration initiated.  Amnisure ROM: negative  Assessment and Plan  Assessment: Valerie [name] is [redacted]w[redacted]d presenting for concerns regarding passage of her mucous plus.  Plan: Plan is to discharge to home due to negative rupture of membranes and closed cervical os.   Charyl Dancer 12/15/2017, 5:28 PM

## 2017-12-15 NOTE — Telephone Encounter (Signed)
Returned call, pt states that she thinks that she iust losing her mucous plug. Reports quarter-size "snot like" discharge that keeps coming out. Pt reports good fetal movement, but is worried that her baby is going to come early, advised to be evaluated at [facility].

## 2017-12-15 NOTE — Discharge Instructions (Signed)

## 2017-12-15 NOTE — MAU Note (Signed)
Pt presents to MAU with c/o lower abdominal pain that started today. She has concerns about losing mucus plus this morning. Pt denies VB and LOF. +FM

## 2017-12-15 NOTE — MAU Provider Note (Signed)
History     CSN: [id number]  Arrival date and time: 12/15/17 1700   First Provider Initiated Contact with Patient 12/15/17 1731      Chief Complaint  Patient presents with  . Abdominal Pain  . Vaginal Discharge   HPI   [name] is a 27 y.o. female [phone] @ [redacted]w[redacted]d here in MAU with complaints of abdominal pain and vaginal discharge. Says today she had two separate occasions of thick vaginal discharge; she is concerned that she may have passed her mucus plug. The mucus is thick and tan in color. No blood. She denies contractions, however has had some lower abdominal cramping that comes and goes. She has not taken anything for the cramping. She has had several episodes of diarrhea, no vomiting. Last intercourse was yesterday morning at 0700. + fetal movement.   OB History    Gravida  4   Para  1   Term  1   Preterm      AB  2   Living  1     SAB  2   TAB      Ectopic      Multiple      Live Births  1           Past Medical History:  Diagnosis Date  . Abnormal Pap smear   . Anemia   . Anxiety   . Eczema   . First degree heart block   . First degree heart block   . Gastric ulcer    2000  . Heart murmur   . Infection    UTI, chronic sinus inf  . Migraine   . Mitral valve regurgitation   . Ovarian cyst   . Tricuspid valve regurgitation   . UTI (urinary tract infection)   . Vaginal Pap smear, abnormal     Past Surgical History:  Procedure Laterality Date  . ADENOIDECTOMY    . COLPOSCOPY    . COLPOSCOPY    . CRYOTHERAPY    . MYRINGOTOMY WITH TUBE PLACEMENT      Family History  Problem Relation Age of Onset  . Hypertension Mother   . Diabetes Father   . Hypertension Father   . Cancer Paternal Aunt        female  . Diabetes Paternal Uncle   . Diabetes Paternal Grandmother   . Hypertension Paternal Grandmother   . Diabetes Paternal Grandfather   . Hypertension Paternal Grandfather     Social History   Tobacco Use  . Smoking  status: Former Smoker    Packs/day: 0.25    Types: Cigarettes    Last attempt to quit: 08/05/2017    Years since quitting: 0.3  . Smokeless tobacco: Never Used  Substance Use Topics  . Alcohol use: Never    Frequency: Never  . Drug use: No    Allergies:  Allergies  Allergen Reactions  . Augmentin [Amoxicillin-Pot Clavulanate] Diarrhea and Nausea And Vomiting       . Ciprofloxacin Hives  . Doxycycline Hives  . Hydrocodone Hives  . Prednisone     Had a reaction to high doses and was told not to take again  . Raspberry Swelling    Throat swelling    Medications Prior to Admission  Medication Sig Dispense Refill Last Dose  . acetaminophen (TYLENOL) 500 MG tablet Take 1,000 mg by mouth every 6 (six) hours as needed for headache (headache). Reported on 04/04/2015   12/01/2017 at Unknown time  .  docusate sodium (COLACE) 100 MG capsule Take 100 mg by mouth daily as needed for mild constipation.   12/02/2017 at Unknown time  . ondansetron (ZOFRAN ODT) 4 MG disintegrating tablet Take 1 tablet (4 mg total) by mouth every 6 (six) hours as needed for nausea. 20 tablet 2   . Prenatal Vit-Fe Fumarate-FA (PRENATAL MULTIVITAMIN) TABS tablet Take 1 tablet by mouth daily at 12 noon.   12/01/2017 at Unknown time  . promethazine (PHENERGAN) 25 MG tablet Take 1 tablet (25 mg total) by mouth every 6 (six) hours as needed for nausea or vomiting. 30 tablet 2   . terconazole (TERAZOL 7) 0.4 % vaginal cream Place 1 applicator vaginally at bedtime. 45 g 0    Results for orders placed or performed during the hospital encounter of 12/15/17 (from the past 48 hour(s))  Urinalysis, Routine w reflex microscopic     Status: Abnormal   Collection Time: 12/15/17  5:14 PM  Result Value Ref Range   Color, Urine YELLOW YELLOW   APPearance HAZY (A) CLEAR   Specific Gravity, Urine 1.025 1.005 - 1.030   pH 5.0 5.0 - 8.0   Glucose, UA NEGATIVE NEGATIVE mg/dL   Hgb urine dipstick NEGATIVE NEGATIVE   Bilirubin Urine  NEGATIVE NEGATIVE   Ketones, ur 80 (A) NEGATIVE mg/dL   Protein, ur NEGATIVE NEGATIVE mg/dL   Nitrite NEGATIVE NEGATIVE   Leukocytes, UA NEGATIVE NEGATIVE    Comment: Performed at [facility], [address] [place], [place] [address]  Amnisure rupture of membrane (rom)not at [facility]     Status: None   Collection Time: 12/15/17  5:44 PM  Result Value Ref Range   Amnisure ROM NEGATIVE     Comment: Performed at [facility], [address] [place], [place] [address]  Wet prep, genital     Status: Abnormal   Collection Time: 12/15/17  5:53 PM  Result Value Ref Range   Yeast Wet Prep HPF POC NONE SEEN NONE SEEN   Trich, Wet Prep NONE SEEN NONE SEEN   Clue Cells Wet Prep HPF POC NONE SEEN NONE SEEN   WBC, Wet Prep HPF POC MODERATE (A) NONE SEEN    Comment: MANY BACTERIA SEEN   Sperm NONE SEEN     Comment: Performed at [facility], [address] [place], [place] [address]    Review of Systems  Gastrointestinal: Positive for abdominal pain.   Physical Exam   Blood pressure 122/72, pulse 100, temperature 98.7 F (37.1 C), temperature source Oral, resp. rate 18, height 5\' 4"  (1.626 m), weight 87.1 kg, last menstrual period 06/30/2017.  Physical Exam  Constitutional: She is oriented to person, place, and time. She appears well-developed and well-nourished.  HENT:  Head: Normocephalic.  Eyes: Pupils are equal, round, and reactive to light.  Respiratory: Effort normal.  GI: Soft. She exhibits no distension. There is no tenderness. There is no rebound and no guarding.  Genitourinary:  Genitourinary Comments: Vagina - Small amount of white vaginal discharge, no odor  Cervix - No contact bleeding, no active bleeding  Chaperone present for exam.  Dilation: Closed Effacement (%): Thick Cervical Position: Posterior Exam by:: , NP  Musculoskeletal: Normal range of motion.  Neurological: She is alert and oriented to person, place, and time.  Skin: Skin is warm.   Psychiatric: Her behavior is normal.   Fetal Tracing: Baseline: 145 bpm Variability: Moderate  Accelerations: 10x10 Decelerations: None Toco: None  MAU Course  Procedures  None  MDM  Crist Fat  slide negative Wet prep collected UA shows >80 ketones, Liter of water given to patient for oral hydration.   Assessment and Plan   A:  1. Abdominal pain in pregnancy, second trimester   2. Dehydration during pregnancy   3. Encounter for suspected premature rupture of amniotic membranes, with rupture of membranes not found     P:  Discharge home in stable condition Increase oral fluids Small, frequent meals Return to MAU if symptoms worsen F/U with ob as scheduled   Venia Carbon 12/15/2017, 7:19 PM

## 2017-12-16 ENCOUNTER — Encounter (HOSPITAL_COMMUNITY): Payer: Self-pay

## 2017-12-16 ENCOUNTER — Ambulatory Visit (HOSPITAL_COMMUNITY)
Admission: RE | Admit: 2017-12-16 | Discharge: 2017-12-16 | Disposition: A | Discharge status: Home / Self Care | Payer: Medicaid Other | Source: Ambulatory Visit | Attending: Obstetrics and Gynecology | Admitting: Obstetrics and Gynecology

## 2017-12-16 DIAGNOSIS — Z362 Encounter for other antenatal screening follow-up: Secondary | ICD-10-CM | POA: Insufficient documentation

## 2017-12-16 DIAGNOSIS — Z3A24 24 weeks gestation of pregnancy: Secondary | ICD-10-CM | POA: Diagnosis not present

## 2017-12-16 DIAGNOSIS — O99212 Obesity complicating pregnancy, second trimester: Secondary | ICD-10-CM | POA: Diagnosis not present

## 2017-12-16 DIAGNOSIS — O99412 Diseases of the circulatory system complicating pregnancy, second trimester: Secondary | ICD-10-CM | POA: Diagnosis not present

## 2017-12-22 ENCOUNTER — Telehealth: Payer: Self-pay

## 2017-12-22 NOTE — Telephone Encounter (Signed)
Pt called stating that she thinks her son may have chicken pox, he has a rash. I told patient to take precautions when caring for him and make sure shes washing her hands, until her sons pediatrician appointment later today. I advised pt that according to her bloodwork she is immune to varicella. I also advised pt to call back and let us know if they diagnose her son with anything at his appointment and we could advise her based on that. Pt verbalized understanding.

## 2017-12-24 ENCOUNTER — Emergency Department (HOSPITAL_COMMUNITY): Payer: Medicaid Other

## 2017-12-24 ENCOUNTER — Other Ambulatory Visit: Payer: Self-pay

## 2017-12-24 ENCOUNTER — Encounter (HOSPITAL_COMMUNITY): Payer: Self-pay

## 2017-12-24 ENCOUNTER — Emergency Department (HOSPITAL_COMMUNITY)
Admission: EM | Admit: 2017-12-24 | Discharge: 2017-12-24 | Disposition: A | Discharge status: Home / Self Care | Payer: Medicaid Other | Attending: Emergency Medicine | Admitting: Emergency Medicine

## 2017-12-24 DIAGNOSIS — Z3A25 25 weeks gestation of pregnancy: Secondary | ICD-10-CM | POA: Diagnosis not present

## 2017-12-24 DIAGNOSIS — S20229A Contusion of unspecified back wall of thorax, initial encounter: Secondary | ICD-10-CM | POA: Diagnosis not present

## 2017-12-24 DIAGNOSIS — O9A212 Injury, poisoning and certain other consequences of external causes complicating pregnancy, second trimester: Secondary | ICD-10-CM | POA: Insufficient documentation

## 2017-12-24 DIAGNOSIS — Y999 Unspecified external cause status: Secondary | ICD-10-CM | POA: Diagnosis not present

## 2017-12-24 DIAGNOSIS — Z87891 Personal history of nicotine dependence: Secondary | ICD-10-CM | POA: Diagnosis not present

## 2017-12-24 DIAGNOSIS — Y929 Unspecified place or not applicable: Secondary | ICD-10-CM | POA: Diagnosis not present

## 2017-12-24 DIAGNOSIS — W109XXA Fall (on) (from) unspecified stairs and steps, initial encounter: Secondary | ICD-10-CM | POA: Diagnosis not present

## 2017-12-24 DIAGNOSIS — W108XXA Fall (on) (from) other stairs and steps, initial encounter: Secondary | ICD-10-CM

## 2017-12-24 DIAGNOSIS — S161XXA Strain of muscle, fascia and tendon at neck level, initial encounter: Secondary | ICD-10-CM | POA: Insufficient documentation

## 2017-12-24 DIAGNOSIS — Y939 Activity, unspecified: Secondary | ICD-10-CM | POA: Diagnosis not present

## 2017-12-24 DIAGNOSIS — Z79899 Other long term (current) drug therapy: Secondary | ICD-10-CM | POA: Insufficient documentation

## 2017-12-24 LAB — CBC WITH DIFFERENTIAL/PLATELET
Abs Immature Granulocytes: 0.02 10*3/uL (ref 0.00–0.07)
BASOS ABS: 0 10*3/uL (ref 0.0–0.1)
BASOS PCT: 0 %
EOS ABS: 0.1 10*3/uL (ref 0.0–0.5)
EOS PCT: 2 %
HEMATOCRIT: 33.7 % — AB (ref 36.0–46.0)
Hemoglobin: 10.8 g/dL — ABNORMAL LOW (ref 12.0–15.0)
Immature Granulocytes: 0 %
Lymphocytes Relative: 18 %
Lymphs Abs: 1.5 10*3/uL (ref 0.7–4.0)
MCH: 30.8 pg (ref 26.0–34.0)
MCHC: 32 g/dL (ref 30.0–36.0)
MCV: 96 fL (ref 80.0–100.0)
Monocytes Absolute: 0.5 10*3/uL (ref 0.1–1.0)
Monocytes Relative: 6 %
NRBC: 0 % (ref 0.0–0.2)
Neutro Abs: 6.1 10*3/uL (ref 1.7–7.7)
Neutrophils Relative %: 74 %
PLATELETS: 198 10*3/uL (ref 150–400)
RBC: 3.51 MIL/uL — ABNORMAL LOW (ref 3.87–5.11)
RDW: 14.3 % (ref 11.5–15.5)
WBC: 8.3 10*3/uL (ref 4.0–10.5)

## 2017-12-24 LAB — URINALYSIS, ROUTINE W REFLEX MICROSCOPIC
Bilirubin Urine: NEGATIVE
GLUCOSE, UA: NEGATIVE mg/dL
HGB URINE DIPSTICK: NEGATIVE
Ketones, ur: NEGATIVE mg/dL
NITRITE: NEGATIVE
Protein, ur: 30 mg/dL — AB
Specific Gravity, Urine: 1.024 (ref 1.005–1.030)
pH: 5 (ref 5.0–8.0)

## 2017-12-24 LAB — COMPREHENSIVE METABOLIC PANEL
ALT: 11 U/L (ref 0–44)
ANION GAP: 8 (ref 5–15)
AST: 15 U/L (ref 15–41)
Albumin: 3 g/dL — ABNORMAL LOW (ref 3.5–5.0)
Alkaline Phosphatase: 74 U/L (ref 38–126)
BUN: 5 mg/dL — ABNORMAL LOW (ref 6–20)
CO2: 18 mmol/L — ABNORMAL LOW (ref 22–32)
CREATININE: 0.56 mg/dL (ref 0.44–1.00)
Calcium: 9 mg/dL (ref 8.9–10.3)
Chloride: 107 mmol/L (ref 98–111)
Glucose, Bld: 84 mg/dL (ref 70–99)
Potassium: 3.7 mmol/L (ref 3.5–5.1)
Sodium: 133 mmol/L — ABNORMAL LOW (ref 135–145)
TOTAL PROTEIN: 5.9 g/dL — AB (ref 6.5–8.1)
Total Bilirubin: 0.3 mg/dL (ref 0.3–1.2)

## 2017-12-24 LAB — ABO/RH: ABO/RH(D): O POS

## 2017-12-24 MED ORDER — MENTHOL (TOPICAL ANALGESIC) 4 % EX GEL: CUTANEOUS | 0 refills | Status: DC

## 2017-12-24 MED ORDER — MORPHINE SULFATE (PF) 2 MG/ML IV SOLN
2.0000 mg | Freq: Once | INTRAVENOUS | Status: AC
  Administered 2017-12-24: 2 mg via INTRAVENOUS
  Filled 2017-12-24: qty 1

## 2017-12-24 MED ORDER — SODIUM CHLORIDE 0.9 % IV SOLN
Freq: Once | INTRAVENOUS | Status: AC
  Administered 2017-12-24: 09:00:00 via INTRAVENOUS

## 2017-12-24 MED ORDER — ACETAMINOPHEN 500 MG PO TABS
1000.0000 mg | ORAL_TABLET | Freq: Once | ORAL | Status: AC
  Administered 2017-12-24: 1000 mg via ORAL
  Filled 2017-12-24: qty 2

## 2017-12-24 MED ORDER — ACETAMINOPHEN 500 MG PO TABS: 1000.0000 mg | ORAL_TABLET | Freq: Four times a day (QID) | ORAL | 0 refills | Status: DC | PRN

## 2017-12-24 NOTE — Progress Notes (Addendum)
1610 Arrived to evaluate this 27yo G4P1 @ 25.[redacted] wks GA in with report of fall down flight of 8-10 stairs.  Pt was attempting to get down stairs quickly when she was on her way to the bathroom. She states she is not sure why she fell. Does not know if she tripped on pants that she was holding or if she lost her footing. She reports falling on right side of back/hip and sliding down on her right side. Complains of back and neck pain. Denies hitting abdomen. Reports decreased fetal movement on scene but now feels fetal movement. Reports underwear feeling wet after fall. Not sure if she urinated on herself.  Pt is currently dry and denies feeling of LOF. Reported cramping initially after fall but denies at this time. Denies vaginal bleeding. 0932 Pt to [place]. FHR appropriate for GA. 1029 Dr. Ashok Pall notified of pt in ED and of above. Orders for total of 4 hours EFM. No orders for OB US received. 1041 Dr. Ashok Pall notified of UCs tracing. No new orders received. Plan is to complete EFM in ED. 1224 Pt moved from Trauma A to hall. Intranet connection not available.  Paper tracing restarted.1304 FHR appropriate for GA, no further UCs or evidence of ROM noted. No vaginal bleeding. Good fetal movement perceived by pt and palpated by RN. Dr. Ashok Pall notified. Orders for OB cleared received.  ED will discharge pt.  Pt given abruption, labor and SROM precautions.

## 2017-12-24 NOTE — ED Triage Notes (Signed)
Pt brought in by EMS due to falling down 8-20 stairs. Pt is [redacted] weeks pregnant. Per pt, she is not feeling the baby move. Pt c/o lower ABD pain. Pt denies LOC or neck/back pain.

## 2017-12-24 NOTE — ED Provider Notes (Signed)
[facility] EMERGENCY DEPARTMENT Provider Note   CSN: [id number] Arrival date & time: 12/24/17  0840     History   Chief Complaint Chief Complaint  Patient presents with  . Fall    level 2    HPI Valerie [name] is a 27 y.o. female.  HPI Patient reports that she is [redacted] weeks gestation with otherwise uncomplicated pregnancy history.  She was going down her stairs to go to the bathroom.  She reports she is not sure why she fell.  She estimates she fell down 10 stairs.  She landed on her back\buttocks towards the right side.  She reports she went down the stairs on her back.  She did not fall forward or rolled down the stairs.  She does not think she hit her head.  No loss of consciousness.  No headache.  She reports she did not have neck pain at the time but she does now feel that her neck is starting to get stiff.  She reports she has lower back pain.  She reports it took her a few minutes but ultimately, she was able to stand up and walk.  She denies that she had any perception of pins-and-needles, weakness or numbness in the extremities.  No chest pain or shortness of breath.  Reports she does perceive some abdominal pain in the very low suprapubic area.  She denies cramping that would feel like contractions.  She denies any vaginal bleeding.  She does report when she fell to the bottom of the stairs she felt that she was wet.  She was unsure if that could have been vaginal fluid loss or loss of urine as she was on her way to the bathroom.  She reports she has felt the baby move since the fall. Past Medical History:  Diagnosis Date  . Abnormal Pap smear   . Anemia   . Anxiety   . Eczema   . First degree heart block   . First degree heart block   . Gastric ulcer    2000  . Heart murmur   . Infection    UTI, chronic sinus inf  . Migraine   . Mitral valve regurgitation   . Ovarian cyst   . Tricuspid valve regurgitation   . UTI (urinary tract infection)   .  Vaginal Pap smear, abnormal     Patient Active Problem List   Diagnosis Date Noted  . Hereditary disease in family possibly affecting fetus, antepartum 11/10/2017  . Fetal abnormality affecting management of mother, antepartum 11/10/2017  . Palpitations 10/16/2017  . Cardiac murmur 10/16/2017  . First degree AV block 10/16/2017  . Supervision of high risk pregnancy, antepartum, second trimester 10/06/2017  . Heart block 10/06/2017  . Unwanted fertility 10/06/2017  . Skin tag of perianal region 12/14/2013  . Depressive disorder, not elsewhere classified 12/14/2013  . Former smoker, stopped smoking in distant past 11/27/2012  . Family history of diabetes in pregnancy 11/27/2012  . History of facial weakness/Neuro workup/Lesion on MRI w/out change 11/27/2012    Past Surgical History:  Procedure Laterality Date  . ADENOIDECTOMY    . COLPOSCOPY    . COLPOSCOPY    . CRYOTHERAPY    . MYRINGOTOMY WITH TUBE PLACEMENT       OB History    Gravida  4   Para  1   Term  1   Preterm      AB  2   Living  1  SAB  2   TAB      Ectopic      Multiple      Live Births  1            Home Medications    Prior to Admission medications   Medication Sig Start Date End Date Taking? Authorizing Provider  acetaminophen (TYLENOL) 500 MG tablet Take 1,000 mg by mouth every 6 (six) hours as needed for headache (headache). Reported on 04/04/2015   Yes [provider]  docusate sodium (COLACE) 100 MG capsule Take 100 mg by mouth 2 (two) times daily.    Yes [provider]  ondansetron (ZOFRAN ODT) 4 MG disintegrating tablet Take 1 tablet (4 mg total) by mouth every 6 (six) hours as needed for nausea. 12/04/17  Yes Constant, Peggy, MD  Prenatal Vit-Fe Fumarate-FA (PRENATAL MULTIVITAMIN) TABS tablet Take 1 tablet by mouth daily at 12 noon.   Yes [provider]  promethazine (PHENERGAN) 25 MG tablet Take 1 tablet (25 mg total) by mouth every 6 (six) hours as  needed for nausea or vomiting. 12/04/17  Yes Constant, Peggy, MD  acetaminophen (TYLENOL) 500 MG tablet Take 2 tablets (1,000 mg total) by mouth every 6 (six) hours as needed. 12/24/17   Arby Barrette, MD  Menthol, Topical Analgesic, (BIOFREEZE) 4 % GEL Applied per package instructions. 12/24/17   Arby Barrette, MD  terconazole (TERAZOL 7) 0.4 % vaginal cream Place 1 applicator vaginally at bedtime. Patient not taking: Reported on 12/16/2017 12/02/17   Danae Orleans, CNM    Family History Family History  Problem Relation Age of Onset  . Hypertension Mother   . Diabetes Father   . Hypertension Father   . Cancer Paternal Aunt        female  . Diabetes Paternal Uncle   . Diabetes Paternal Grandmother   . Hypertension Paternal Grandmother   . Diabetes Paternal Grandfather   . Hypertension Paternal Grandfather     Social History Social History   Tobacco Use  . Smoking status: Former Smoker    Packs/day: 0.25    Types: Cigarettes    Last attempt to quit: 08/05/2017    Years since quitting: 0.3  . Smokeless tobacco: Never Used  Substance Use Topics  . Alcohol use: Never    Frequency: Never  . Drug use: No     Allergies   Augmentin [amoxicillin-pot clavulanate]; Ciprofloxacin; Doxycycline; Hydrocodone; Prednisone; and Raspberry   Review of Systems Review of Systems 10 Systems reviewed and are negative for acute change except as noted in the HPI.   Physical Exam Updated Vital Signs BP (!) 91/59   Pulse 69   Temp 98.2 F (36.8 C) (Oral)   Resp 14   Ht 5\' 4"  (1.626 m)   Wt 87.9 kg   LMP 06/30/2017   SpO2 99%   BMI 33.26 kg/m   Physical Exam  Constitutional:  Patient is alert and nontoxic.  No respiratory distress.  Patient's mental status is clear and interactive.  Color is good.  HENT:  Head: Normocephalic and atraumatic.  Right Ear: External ear normal.  Left Ear: External ear normal.  Nose: Nose normal.  Mouth/Throat: Oropharynx is clear and moist.  Eyes:  EOM are normal.  Neck: Neck supple.  Patient endorses moderate discomfort to palpation mid to upper cervical spine.  Cardiovascular: Normal rate, regular rhythm, normal heart sounds and intact distal pulses.  Pulmonary/Chest: Effort normal and breath sounds normal. She exhibits no tenderness.  Abdominal:  Abdomen is soft without guarding.  Gravid abdomen.  Mild to moderate discomfort to the suprapubic area.  No visible bruising to the abdomen  Musculoskeletal:  No visible contusions or abrasions to the back.  Patient endorses tenderness to the lumbar spine and to the parous spinous SI regions bilaterally.  Normal range of motion of extremities.  Both lower extremities can put into deep flexion and push against resistance.  Distal pulses 2+ and symmetric.  No contusions or abrasions to extremities.  Skin: Skin is warm and dry.  Psychiatric: She has a normal mood and affect.     ED Treatments / Results  Labs (all labs ordered are listed, but only abnormal results are displayed) Labs Reviewed  COMPREHENSIVE METABOLIC PANEL - Abnormal; Notable for the following components:      Result Value   Sodium 133 (*)    CO2 18 (*)    BUN 5 (*)    Total Protein 5.9 (*)    Albumin 3.0 (*)    All other components within normal limits  CBC WITH DIFFERENTIAL/PLATELET - Abnormal; Notable for the following components:   RBC 3.51 (*)    Hemoglobin 10.8 (*)    HCT 33.7 (*)    All other components within normal limits  URINALYSIS, ROUTINE W REFLEX MICROSCOPIC - Abnormal; Notable for the following components:   Color, Urine AMBER (*)    APPearance CLOUDY (*)    Protein, ur 30 (*)    Leukocytes, UA LARGE (*)    Bacteria, UA RARE (*)    All other components within normal limits  ABO/RH    EKG None  Radiology US Abdomen Complete  Result Date: 12/24/2017 CLINICAL DATA:  Patient is [redacted] weeks pregnant and has sustained a fall and is complaining of abdominal pain. EXAM: ABDOMEN ULTRASOUND COMPLETE  COMPARISON:  None. FINDINGS: Gallbladder: No gallstones or wall thickening visualized. No sonographic Murphy sign noted by sonographer. Common bile duct: Diameter: 1.7 mm Liver: No focal lesion identified. Within normal limits in parenchymal echogenicity. Portal vein is patent on color Doppler imaging with normal direction of blood flow towards the liver. IVC: No abnormality visualized. Pancreas: The pancreatic head and body are normal in appearance. The pancreatic tail is obscured by bowel gas. Spleen: Size and appearance within normal limits. Right Kidney: Length: 11.2 cm. Echogenicity within normal limits. No mass or hydronephrosis visualized. Left Kidney: Length: 12.7 cm. Echogenicity within normal limits. No mass or hydronephrosis visualized. Abdominal aorta: No aneurysm visualized. Other findings: No ascites is observed. IMPRESSION: No acute intra-abdominal abnormality is observed. Please note the fetus was not evaluated on this study. Electronically Signed   By: David  [place] M.D.   On: 12/24/2017 10:29    Procedures Procedures (including critical care time)  Medications Ordered in ED Medications  0.9 %  sodium chloride infusion ( Intravenous New Bag/Given 12/24/17 0917)  acetaminophen (TYLENOL) tablet 1,000 mg (1,000 mg Oral Given 12/24/17 0929)  morphine 2 MG/ML injection 2 mg (2 mg Intravenous Given 12/24/17 1049)     Initial Impression / Assessment and Plan / ED Course  I have reviewed the triage vital signs and the nursing notes.  Pertinent labs & imaging results that were available during my care of the patient were reviewed by me and considered in my medical decision making (see chart for details).    Patient has been monitored by OB rapid response team and has been cleared.  She is alert and appropriate.  She has no neurologic deficits.  Ultrasound shows no solid organ injury.  She remained stable.  She has some lower back and sacral pain.  At this time, I do have low suspicion for  fracture.  I most suspect contusion and strain.  She will be given precautionary return statements for weakness numbness tingling and gait dysfunction.  She is given return precautions for vaginal bleeding or discharge to contact her OBGYN doctor.  At time of discharge, patient shows no distress.  She is sitting up and has been ambulatory without difficulty.  Stable for continued outpatient management.  Final Clinical Impressions(s) / ED Diagnoses   Final diagnoses:  Fall down stairs, initial encounter  [redacted] weeks gestation of pregnancy  Contusion of back, unspecified laterality, initial encounter  Strain of neck muscle, initial encounter    ED Discharge Orders         Ordered    acetaminophen (TYLENOL) 500 MG tablet  Every 6 hours PRN     12/24/17 1316    Menthol, Topical Analgesic, (BIOFREEZE) 4 % GEL     12/24/17 1316           Arby Barrette, MD 12/24/17 1320

## 2017-12-24 NOTE — Progress Notes (Signed)
Orthopedic Tech Progress Note Patient Details:  Valerie [name] 15-Sep-1990 [id number]  Patient ID: [name], female   DOB: 11/06/1990, 27 y.o.   MRN: [id number]   Nikki Dom 12/24/2017, 9:05 AM Made level 2 trauma visit

## 2017-12-25 ENCOUNTER — Telehealth: Payer: Self-pay

## 2017-12-25 NOTE — Telephone Encounter (Signed)
Patient called stating that she had a fall yesterday and was evaluated at MAU and discharged to home. Pt states she is very sore and "tight" from the fall and wants to know if there's anything she can take besides tylenol for the pain. Pt denies any fluid leaking or bleeding, she reports good fetal movement, denies contractions.

## 2017-12-29 ENCOUNTER — Inpatient Hospital Stay (HOSPITAL_COMMUNITY)
Admission: AD | Admit: 2017-12-29 | Discharge: 2017-12-30 | Disposition: A | Discharge status: Home / Self Care | Payer: Medicaid Other | Source: Ambulatory Visit | Attending: Obstetrics and Gynecology | Admitting: Obstetrics and Gynecology

## 2017-12-29 ENCOUNTER — Other Ambulatory Visit: Payer: Self-pay

## 2017-12-29 ENCOUNTER — Encounter (HOSPITAL_COMMUNITY): Payer: Self-pay | Admitting: *Deleted

## 2017-12-29 DIAGNOSIS — Z3A26 26 weeks gestation of pregnancy: Secondary | ICD-10-CM

## 2017-12-29 DIAGNOSIS — R109 Unspecified abdominal pain: Secondary | ICD-10-CM

## 2017-12-29 DIAGNOSIS — O2242 Hemorrhoids in pregnancy, second trimester: Secondary | ICD-10-CM | POA: Diagnosis not present

## 2017-12-29 DIAGNOSIS — O4692 Antepartum hemorrhage, unspecified, second trimester: Secondary | ICD-10-CM | POA: Insufficient documentation

## 2017-12-29 DIAGNOSIS — N898 Other specified noninflammatory disorders of vagina: Secondary | ICD-10-CM

## 2017-12-29 DIAGNOSIS — Z87891 Personal history of nicotine dependence: Secondary | ICD-10-CM | POA: Insufficient documentation

## 2017-12-29 DIAGNOSIS — O26892 Other specified pregnancy related conditions, second trimester: Secondary | ICD-10-CM

## 2017-12-29 HISTORY — DX: Acute vaginitis: B96.89

## 2017-12-29 HISTORY — DX: Other specified bacterial agents as the cause of diseases classified elsewhere: B96.89

## 2017-12-29 HISTORY — DX: Bell's palsy: G51.0

## 2017-12-29 HISTORY — DX: Acute vaginitis: N76.0

## 2017-12-29 LAB — URINALYSIS, ROUTINE W REFLEX MICROSCOPIC
Bilirubin Urine: NEGATIVE
GLUCOSE, UA: NEGATIVE mg/dL
HGB URINE DIPSTICK: NEGATIVE
Ketones, ur: 5 mg/dL — AB
NITRITE: NEGATIVE
PH: 5 (ref 5.0–8.0)
Protein, ur: NEGATIVE mg/dL
SPECIFIC GRAVITY, URINE: 1.027 (ref 1.005–1.030)

## 2017-12-29 NOTE — MAU Provider Note (Signed)
Chief Complaint:  Vaginal Bleeding   First Provider Initiated Contact with Patient 12/29/17 2339     HPI: Valerie [name] is a 27 y.o. [id number] at [redacted]w[redacted]d who presents to maternity admissions reporting vaginal bleeding. States she had a glob of brown blood in her underwear this evening. No bright red bleeding. Reports occasional abdominal tightening that occurs 2-3 times per hour today. No recent intercourse. Denies LOF, dysuria, n/v/d. Positive fetal movement.    Location: lower abdomen Quality: nagging discomfort Severity: 4/10 in pain scale Duration: 1 day Timing: intermittent, twice per hour Modifying factors: nothing makes better or worse Associated signs and symptoms: ?vaginal bleeding    Past Medical History:  Diagnosis Date  . Abnormal Pap smear   . Anemia   . Anxiety   . Bell's palsy   . BV (bacterial vaginosis)   . Eczema   . First degree heart block   . Gastric ulcer    2000  . Heart murmur   . Infection    UTI, chronic sinus inf  . Migraine   . Mitral valve regurgitation   . Ovarian cyst   . Tricuspid valve regurgitation   . UTI (urinary tract infection)    OB History  Gravida Para Term Preterm AB Living  4 1 1   2 1   SAB TAB Ectopic Multiple Live Births  2       1    # Outcome Date GA Lbr Len/2nd Weight Sex Delivery Anes PTL Lv  4 Current           3 Term 07/07/13 [redacted]w[redacted]d 10:05 / 01:11 2850 g M Vag-Spont EPI  LIV  2 SAB 02/16/12 [redacted]w[redacted]d         1 SAB 12/17/03 [redacted]w[redacted]d          Past Surgical History:  Procedure Laterality Date  . ADENOIDECTOMY    . COLPOSCOPY    . CRYOTHERAPY    . MYRINGOTOMY WITH TUBE PLACEMENT     Family History  Problem Relation Age of Onset  . Hypertension Mother   . Diabetes Father   . Hypertension Father   . Cancer Paternal Aunt        female  . Diabetes Paternal Uncle   . Diabetes Paternal Grandmother   . Hypertension Paternal Grandmother   . Diabetes Paternal Grandfather   . Hypertension Paternal Grandfather    Social  History   Tobacco Use  . Smoking status: Former Smoker    Packs/day: 0.25    Types: Cigarettes    Last attempt to quit: 08/05/2017    Years since quitting: 0.4  . Smokeless tobacco: Never Used  Substance Use Topics  . Alcohol use: Never    Frequency: Never  . Drug use: No   Allergies  Allergen Reactions  . Augmentin [Amoxicillin-Pot Clavulanate] Diarrhea and Nausea And Vomiting       . Ciprofloxacin Hives  . Doxycycline Hives  . Hydrocodone Hives  . Prednisone     Had a reaction to high doses and was told not to take again  . Raspberry Swelling    Throat swelling   No medications prior to admission.    I have reviewed patient's Past Medical Hx, Surgical Hx, Family Hx, Social Hx, medications and allergies.   ROS:  Review of Systems  Constitutional: Negative.   Gastrointestinal: Positive for abdominal pain and constipation. Negative for diarrhea, nausea and vomiting.  Genitourinary: Positive for vaginal bleeding. Negative for dysuria and vaginal discharge.  Physical Exam   Patient Vitals for the past 24 hrs:  BP Temp Temp src Pulse Resp Height Weight  12/29/17 2355 118/68 - - 83 16 - -  12/29/17 2245 117/69 97.9 F (36.6 C) Oral 86 18 5\' 6"  (1.676 m) 88.1 kg    Constitutional: Well-developed, well-nourished female in no acute distress.  Cardiovascular: normal rate & rhythm, no murmur Respiratory: normal effort, lung sounds clear throughout GI: Abd soft, non-tender, gravid appropriate for gestational age. Pos BS x 4 MS: Extremities nontender, no edema, normal ROM Neurologic: Alert and oriented x 4.  GU:      Pelvic: NEFG, physiologic discharge, no blood, cervix clean. Small amount of red staining around rectum. 0.5 cm flesh colored external hemorrhoids x 2. No active bleeding.   Dilation: Closed Effacement (%): Thick Cervical Position: Posterior Exam by:: Estanislado Spire NP  NST:  Baseline: 150 bpm, Variability: Good {> 6 bpm), Accelerations: Non-reactive but  appropriate for gestational age and Decelerations: Absent   Labs: Results for orders placed or performed during the hospital encounter of 12/29/17 (from the past 24 hour(s))  Urinalysis, Routine w reflex microscopic     Status: Abnormal   Collection Time: 12/29/17 11:05 PM  Result Value Ref Range   Color, Urine YELLOW YELLOW   APPearance HAZY (A) CLEAR   Specific Gravity, Urine 1.027 1.005 - 1.030   pH 5.0 5.0 - 8.0   Glucose, UA NEGATIVE NEGATIVE mg/dL   Hgb urine dipstick NEGATIVE NEGATIVE   Bilirubin Urine NEGATIVE NEGATIVE   Ketones, ur 5 (A) NEGATIVE mg/dL   Protein, ur NEGATIVE NEGATIVE mg/dL   Nitrite NEGATIVE NEGATIVE   Leukocytes, UA SMALL (A) NEGATIVE   RBC / HPF 0-5 0 - 5 RBC/hpf   WBC, UA 0-5 0 - 5 WBC/hpf   Bacteria, UA RARE (A) NONE SEEN   Squamous Epithelial / LPF 11-20 0 - 5   Mucus PRESENT    Ca Oxalate Crys, UA PRESENT     Imaging:  No results found.  MAU Course: Orders Placed This Encounter  Procedures  . Culture, OB Urine  . Urinalysis, Routine w reflex microscopic  . Discharge patient   No orders of the defined types were placed in this encounter.   MDM: RH positive.  Pt showed picture of blood in her underwear, tan mucoid discharge.  On exam, no blood in vagina. Small amount of physiologic discharge. Cervix pink & smooth without friability. Cervix closed/thick/firm Pt appears to have external hemorrhoids & some staining of blood around her rectum. Endorses constipation with last BM being today.   Assessment: 1. Abdominal pain during pregnancy in second trimester   2. [redacted] weeks gestation of pregnancy   3. Vaginal discharge during pregnancy in second trimester   4. Hemorrhoids during pregnancy in second trimester     Plan: Discharge home in stable condition.  Preterm Labor precautions and fetal kick counts Discussed tx of constipation & hemorrhoids Keep appt in office on Thursday  Allergies as of 12/30/2017      Reactions   Augmentin  [amoxicillin-pot Clavulanate] Diarrhea, Nausea And Vomiting       Ciprofloxacin Hives   Doxycycline Hives   Hydrocodone Hives   Prednisone    Had a reaction to high doses and was told not to take again   Raspberry Swelling   Throat swelling      Medication List    STOP taking these medications   terconazole 0.4 % vaginal cream Commonly known as:  TERAZOL  7     TAKE these medications   acetaminophen 500 MG tablet Commonly known as:  TYLENOL Take 2 tablets (1,000 mg total) by mouth every 6 (six) hours as needed. What changed:  Another medication with the same name was removed. Continue taking this medication, and follow the directions you see here.   docusate sodium 100 MG capsule Commonly known as:  COLACE Take 100 mg by mouth 2 (two) times daily.   Menthol (Topical Analgesic) 4 % Gel Applied per package instructions.   ondansetron 4 MG disintegrating tablet Commonly known as:  ZOFRAN-ODT Take 1 tablet (4 mg total) by mouth every 6 (six) hours as needed for nausea.   prenatal multivitamin Tabs tablet Take 1 tablet by mouth daily at 12 noon.   promethazine 25 MG tablet Commonly known as:  PHENERGAN Take 1 tablet (25 mg total) by mouth every 6 (six) hours as needed for nausea or vomiting.       Judeth Horn, NP 12/30/2017 5:04 AM

## 2017-12-29 NOTE — Discharge Instructions (Signed)
Safe Medications in Pregnancy   Acne: Benzoyl Peroxide Salicylic Acid  Backache/Headache: Tylenol: 2 regular strength every 4 hours OR              2 Extra strength every 6 hours  Colds/Coughs/Allergies: Benadryl (alcohol free) 25 mg every 6 hours as needed Breath right strips Claritin Cepacol throat lozenges Chloraseptic throat spray Cold-Eeze- up to three times per day Cough drops, alcohol free Flonase (by prescription only) Guaifenesin Mucinex Robitussin DM (plain only, alcohol free) Saline nasal spray/drops Sudafed (pseudoephedrine) & Actifed ** use only after [redacted] weeks gestation and if you do not have high blood pressure Tylenol Vicks Vaporub Zinc lozenges Zyrtec   Constipation: Colace Ducolax suppositories Fleet enema Glycerin suppositories Metamucil Milk of magnesia Miralax Senokot Smooth move tea  Diarrhea: Kaopectate Imodium A-D  *NO pepto Bismol  Hemorrhoids: Anusol Anusol HC Preparation H Tucks  Indigestion: Tums Maalox Mylanta Zantac  Pepcid  Insomnia: Benadryl (alcohol free) 25mg  every 6 hours as needed Tylenol PM Unisom, no Gelcaps  Leg Cramps: Tums MagGel  Nausea/Vomiting:  Bonine Dramamine Emetrol Ginger extract Sea bands Meclizine  Nausea medication to take during pregnancy:  Unisom (doxylamine succinate 25 mg tablets) Take one tablet daily at bedtime. If symptoms are not adequately controlled, the dose can be increased to a maximum recommended dose of two tablets daily (1/2 tablet in the morning, 1/2 tablet mid-afternoon and one at bedtime). Vitamin B6 100mg  tablets. Take one tablet twice a day (up to 200 mg per day).  Skin Rashes: Aveeno products Benadryl cream or 25mg  every 6 hours as needed Calamine Lotion 1% cortisone cream  Yeast infection: Gyne-lotrimin 7 Monistat 7  Gum/tooth pain: Anbesol  **If taking multiple medications, please check labels to avoid duplicating the same active ingredients **take  medication as directed on the label ** Do not exceed 4000 mg of tylenol in 24 hours **Do not take medications that contain aspirin or ibuprofen     Hemorrhoids Hemorrhoids are swollen veins in and around the rectum or anus. There are two types of hemorrhoids:  Internal hemorrhoids. These occur in the veins that are just inside the rectum. They may poke through to the outside and become irritated and painful.  External hemorrhoids. These occur in the veins that are outside of the anus and can be felt as a painful swelling or hard lump near the anus.  Most hemorrhoids do not cause serious problems, and they can be managed with home treatments such as diet and lifestyle changes. If home treatments do not help your symptoms, procedures can be done to shrink or remove the hemorrhoids. What are the causes? This condition is caused by increased pressure in the anal area. This pressure may result from various things, including:  Constipation.  Straining to have a bowel movement.  Diarrhea.  Pregnancy.  Obesity.  Sitting for long periods of time.  Heavy lifting or other activity that causes you to strain.  Anal sex.  What are the signs or symptoms? Symptoms of this condition include:  Pain.  Anal itching or irritation.  Rectal bleeding.  Leakage of stool (feces).  Anal swelling.  One or more lumps around the anus.  How is this diagnosed? This condition can often be diagnosed through a visual exam. Other exams or tests may also be done, such as:  Examination of the rectal area with a gloved hand (digital rectal exam).  Examination of the anal canal using a small tube (anoscope).  A blood test, if you have  lost a significant amount of blood.  A test to look inside the colon (sigmoidoscopy or colonoscopy).  How is this treated? This condition can usually be treated at home. However, various procedures may be done if dietary changes, lifestyle changes, and other home  treatments do not help your symptoms. These procedures can help make the hemorrhoids smaller or remove them completely. Some of these procedures involve surgery, and others do not. Common procedures include:  Rubber band ligation. Rubber bands are placed at the base of the hemorrhoids to cut off the blood supply to them.  Sclerotherapy. Medicine is injected into the hemorrhoids to shrink them.  Infrared coagulation. A type of light energy is used to get rid of the hemorrhoids.  Hemorrhoidectomy surgery. The hemorrhoids are surgically removed, and the veins that supply them are tied off.  Stapled hemorrhoidopexy surgery. A circular stapling device is used to remove the hemorrhoids and use staples to cut off the blood supply to them.  Follow these instructions at home: Eating and drinking  Eat foods that have a lot of fiber in them, such as whole grains, beans, nuts, fruits, and vegetables. Ask your health care provider about taking products that have added fiber (fiber supplements).  Drink enough fluid to keep your urine clear or pale yellow. Managing pain and swelling  Take warm sitz baths for 20 minutes, 3-4 times a day to ease pain and discomfort.  If directed, apply ice to the affected area. Using ice packs between sitz baths may be helpful. ? Put ice in a plastic bag. ? Place a towel between your skin and the bag. ? Leave the ice on for 20 minutes, 2-3 times a day. General instructions  Take over-the-counter and prescription medicines only as told by your health care provider.  Use medicated creams or suppositories as told.  Exercise regularly.  Go to the bathroom when you have the urge to have a bowel movement. Do not wait.  Avoid straining to have bowel movements.  Keep the anal area dry and clean. Use wet toilet paper or moist towelettes after a bowel movement.  Do not sit on the toilet for long periods of time. This increases blood pooling and pain. Contact a health  care provider if:  You have increasing pain and swelling that are not controlled by treatment or medicine.  You have uncontrolled bleeding.  You have difficulty having a bowel movement, or you are unable to have a bowel movement.  You have pain or inflammation outside the area of the hemorrhoids. This information is not intended to replace advice given to you by your health care provider. Make sure you discuss any questions you have with your health care provider. Document Released: 03/01/2000 Document Revised: 08/02/2015 Document Reviewed: 11/16/2014 Elsevier Interactive Patient Education  [employer].

## 2017-12-29 NOTE — MAU Note (Signed)
Pt presents to MAU c/o abdominal pain and some vaginal bleeding. Pt reports she had a fall down the stairs recently and was evlauated at [place] and baby had a good HR and she had some occasional ctx. Pt states now she has had some abdominal pain that is hard to explain as well as some brownish discharge that had discolored her underwear as well as some pink tinged blood on toilet paper. +FM.

## 2017-12-31 LAB — CULTURE, OB URINE

## 2018-01-01 ENCOUNTER — Ambulatory Visit (INDEPENDENT_AMBULATORY_CARE_PROVIDER_SITE_OTHER): Payer: Medicaid Other | Admitting: Obstetrics and Gynecology

## 2018-01-01 ENCOUNTER — Encounter: Payer: Self-pay | Admitting: Obstetrics and Gynecology

## 2018-01-01 ENCOUNTER — Other Ambulatory Visit: Payer: Medicaid Other

## 2018-01-01 VITALS — BP 105/73 | HR 93 | Wt 194.6 lb

## 2018-01-01 DIAGNOSIS — O0992 Supervision of high risk pregnancy, unspecified, second trimester: Secondary | ICD-10-CM

## 2018-01-01 DIAGNOSIS — O359XX0 Maternal care for (suspected) fetal abnormality and damage, unspecified, not applicable or unspecified: Secondary | ICD-10-CM

## 2018-01-01 DIAGNOSIS — I440 Atrioventricular block, first degree: Secondary | ICD-10-CM

## 2018-01-01 DIAGNOSIS — Z3009 Encounter for other general counseling and advice on contraception: Secondary | ICD-10-CM

## 2018-01-01 MED ORDER — CYCLOBENZAPRINE HCL 10 MG PO TABS: 10.0000 mg | ORAL_TABLET | Freq: Three times a day (TID) | ORAL | 0 refills | Status: DC | PRN

## 2018-01-01 NOTE — Progress Notes (Signed)
   PRENATAL VISIT NOTE  Subjective:  Valerie [name] is a 27 y.o. [phone] at [redacted]w[redacted]d being seen today for ongoing prenatal care.  She is currently monitored for the following issues for this high-risk pregnancy and has Former smoker, stopped smoking in distant past; Family history of diabetes in pregnancy; History of facial weakness/Neuro workup/Lesion on MRI w/out change; Skin tag of perianal region; Depressive disorder, not elsewhere classified; Supervision of high risk pregnancy, antepartum, second trimester; Heart block; Unwanted fertility; Palpitations; Cardiac murmur; First degree AV block; Hereditary disease in family possibly affecting fetus, antepartum; and Fetal abnormality affecting management of mother, antepartum on their problem list.  Patient reports significant back pain after fall, she has severe pain in lower back where she fell, and has tightness in upper back.  Contractions: Irritability. Vag. Bleeding: None.  Movement: Present. Denies leaking of fluid.   The following portions of the patient's history were reviewed and updated as appropriate: allergies, current medications, past family history, past medical history, past social history, past surgical history and problem list. Problem list updated.  Objective:   Vitals:   01/01/18 0902  BP: 105/73  Pulse: 93  Weight: 194 lb 9.6 oz (88.3 kg)    Fetal Status: Fetal Heart Rate (bpm): 145   Movement: Present     General:  Alert, oriented and cooperative. Patient is in no acute distress.  Skin: Skin is warm and dry. No rash noted.   Cardiovascular: Normal heart rate noted  Respiratory: Normal respiratory effort, no problems with respiration noted  Abdomen: Soft, gravid, appropriate for gestational age.  Pain/Pressure: Present     Pelvic: Cervical exam deferred        Extremities: Normal range of motion.  Edema: None  Mental Status: Normal mood and affect. Normal behavior. Normal judgment and thought content.   Assessment  and Plan:  Pregnancy: G4P1021 at [redacted]w[redacted]d  1. Supervision of high risk pregnancy, antepartum, second trimester Back pain after fall, to increase tylenol use and sent flexeril to pharmacy  2. Unwanted fertility Signed BTL papers today  3. Fetal abnormality affecting management of mother, antepartum, single or unspecified fetus No further f/u necessary per MFM  4. First degree AV block Stable, s/p cardiology consult  5. Anxiety/depression - she is feeling some anxiety creeping up, feels okay now but is wondering if she could start lexapro back later in pregnancy if she is feeling worse - provided reassurance - cannot take zoloft due to suicidal thoughts  Preterm labor symptoms and general obstetric precautions including but not limited to vaginal bleeding, contractions, leaking of fluid and fetal movement were reviewed in detail with the patient. Please refer to After Visit Summary for other counseling recommendations.  Return in about 2 weeks (around 01/15/2018) for OB visit (MD).  Future Appointments  Date Time Provider Department Center  01/01/2018 10:00 AM Conan Bowens, MD [facility] None    Conan Bowens, MD

## 2018-01-01 NOTE — Progress Notes (Signed)
Pt presents for ROB. Pt complains of lower back pain from a fall last week.

## 2018-01-02 LAB — CBC
HEMATOCRIT: 32.8 % — AB (ref 34.0–46.6)
HEMOGLOBIN: 11 g/dL — AB (ref 11.1–15.9)
MCH: 31.6 pg (ref 26.6–33.0)
MCHC: 33.5 g/dL (ref 31.5–35.7)
MCV: 94 fL (ref 79–97)
Platelets: 209 10*3/uL (ref 150–450)
RBC: 3.48 x10E6/uL — ABNORMAL LOW (ref 3.77–5.28)
RDW: 14.1 % (ref 12.3–15.4)
WBC: 8.5 10*3/uL (ref 3.4–10.8)

## 2018-01-02 LAB — RPR: RPR Ser Ql: NONREACTIVE

## 2018-01-02 LAB — GLUCOSE TOLERANCE, 2 HOURS W/ 1HR
Glucose, 1 hour: 141 mg/dL (ref 65–179)
Glucose, 2 hour: 106 mg/dL (ref 65–152)
Glucose, Fasting: 83 mg/dL (ref 65–91)

## 2018-01-02 LAB — HIV ANTIBODY (ROUTINE TESTING W REFLEX): HIV Screen 4th Generation wRfx: NONREACTIVE

## 2018-01-15 ENCOUNTER — Encounter (HOSPITAL_COMMUNITY): Payer: Self-pay

## 2018-01-15 ENCOUNTER — Encounter: Payer: Medicaid Other | Admitting: Obstetrics and Gynecology

## 2018-01-15 ENCOUNTER — Inpatient Hospital Stay (HOSPITAL_COMMUNITY)
Admission: AD | Admit: 2018-01-15 | Discharge: 2018-01-15 | Disposition: A | Discharge status: Home / Self Care | Payer: Medicaid Other | Source: Ambulatory Visit | Attending: Obstetrics & Gynecology | Admitting: Obstetrics & Gynecology

## 2018-01-15 ENCOUNTER — Other Ambulatory Visit: Payer: Self-pay

## 2018-01-15 DIAGNOSIS — O99343 Other mental disorders complicating pregnancy, third trimester: Secondary | ICD-10-CM | POA: Diagnosis not present

## 2018-01-15 DIAGNOSIS — Z9889 Other specified postprocedural states: Secondary | ICD-10-CM | POA: Insufficient documentation

## 2018-01-15 DIAGNOSIS — Z87891 Personal history of nicotine dependence: Secondary | ICD-10-CM | POA: Diagnosis not present

## 2018-01-15 DIAGNOSIS — F419 Anxiety disorder, unspecified: Secondary | ICD-10-CM | POA: Diagnosis not present

## 2018-01-15 DIAGNOSIS — Z885 Allergy status to narcotic agent status: Secondary | ICD-10-CM | POA: Insufficient documentation

## 2018-01-15 DIAGNOSIS — R197 Diarrhea, unspecified: Secondary | ICD-10-CM | POA: Insufficient documentation

## 2018-01-15 DIAGNOSIS — R0989 Other specified symptoms and signs involving the circulatory and respiratory systems: Secondary | ICD-10-CM | POA: Diagnosis not present

## 2018-01-15 DIAGNOSIS — Z833 Family history of diabetes mellitus: Secondary | ICD-10-CM | POA: Insufficient documentation

## 2018-01-15 DIAGNOSIS — R110 Nausea: Secondary | ICD-10-CM | POA: Diagnosis not present

## 2018-01-15 DIAGNOSIS — R109 Unspecified abdominal pain: Secondary | ICD-10-CM | POA: Diagnosis not present

## 2018-01-15 DIAGNOSIS — Z888 Allergy status to other drugs, medicaments and biological substances status: Secondary | ICD-10-CM | POA: Insufficient documentation

## 2018-01-15 DIAGNOSIS — Z79899 Other long term (current) drug therapy: Secondary | ICD-10-CM | POA: Insufficient documentation

## 2018-01-15 DIAGNOSIS — O26899 Other specified pregnancy related conditions, unspecified trimester: Secondary | ICD-10-CM

## 2018-01-15 DIAGNOSIS — O26893 Other specified pregnancy related conditions, third trimester: Secondary | ICD-10-CM | POA: Diagnosis present

## 2018-01-15 DIAGNOSIS — Z880 Allergy status to penicillin: Secondary | ICD-10-CM | POA: Diagnosis not present

## 2018-01-15 DIAGNOSIS — Z881 Allergy status to other antibiotic agents status: Secondary | ICD-10-CM | POA: Insufficient documentation

## 2018-01-15 DIAGNOSIS — O99513 Diseases of the respiratory system complicating pregnancy, third trimester: Secondary | ICD-10-CM | POA: Diagnosis not present

## 2018-01-15 LAB — URINALYSIS, ROUTINE W REFLEX MICROSCOPIC
Bacteria, UA: NONE SEEN
Bilirubin Urine: NEGATIVE
GLUCOSE, UA: NEGATIVE mg/dL
HGB URINE DIPSTICK: NEGATIVE
Ketones, ur: 5 mg/dL — AB
NITRITE: NEGATIVE
PH: 5 (ref 5.0–8.0)
Protein, ur: NEGATIVE mg/dL
Specific Gravity, Urine: 1.03 (ref 1.005–1.030)

## 2018-01-15 NOTE — MAU Note (Addendum)
Pt here with reports of contractions that started last night. States throughout the day, they have gotten stronger and closer together. States she has had about 6 an hour all day. Pt denies LOF or vaginal. Reports good fetal movement. Pt reports 4 episodes of watery diarrhea today. Denies vomiting

## 2018-01-15 NOTE — Discharge Instructions (Signed)

## 2018-01-15 NOTE — MAU Provider Note (Signed)
History     CSN: [id number]  Arrival date and time: 01/15/18 2033   First Provider Initiated Contact with Patient 01/15/18 2107      Chief Complaint  Patient presents with  . Contractions   Valerie [name] presents with abdominal pain that started last night She reports that she's had rhythmic tightening of her belly that starts in her back and moves around to the front of her belly like a tight band The pain that she thinks may be contractions has averaged about 6 per hour Some hours she has none at all and some hours, she has more than 6 Pain is not severe and hasn't gotten worse or more frequent She also started having diarrhea last night that continued this morning, then got a little better Overall she thinks she's had about 6 episodes of diarrhea Also has some mild respiratory symptoms, fatigue, decreased appetite and nausea Poor PO intake for the past 2 days Denies LOF/vaginal bleeding; good fetal movement   Past Medical History:  Diagnosis Date  . Abnormal Pap smear   . Anemia   . Anxiety   . Bell's palsy   . BV (bacterial vaginosis)   . Eczema   . First degree heart block   . Gastric ulcer    2000  . Heart murmur   . Infection    UTI, chronic sinus inf  . Migraine   . Mitral valve regurgitation   . Ovarian cyst   . Tricuspid valve regurgitation   . UTI (urinary tract infection)     Past Surgical History:  Procedure Laterality Date  . ADENOIDECTOMY    . COLPOSCOPY    . CRYOTHERAPY    . MYRINGOTOMY WITH TUBE PLACEMENT      Family History  Problem Relation Age of Onset  . Hypertension Mother   . Diabetes Father   . Hypertension Father   . Cancer Paternal Aunt        female  . Diabetes Paternal Uncle   . Diabetes Paternal Grandmother   . Hypertension Paternal Grandmother   . Diabetes Paternal Grandfather   . Hypertension Paternal Grandfather     Social History   Tobacco Use  . Smoking status: Former Smoker    Packs/day: 0.25    Types:  Cigarettes    Last attempt to quit: 08/05/2017    Years since quitting: 0.4  . Smokeless tobacco: Never Used  Substance Use Topics  . Alcohol use: Never    Frequency: Never  . Drug use: No    Allergies:  Allergies  Allergen Reactions  . Augmentin [Amoxicillin-Pot Clavulanate] Diarrhea and Nausea And Vomiting       . Ciprofloxacin Hives  . Doxycycline Hives  . Hydrocodone Hives  . Prednisone     Had a reaction to high doses and was told not to take again  . Raspberry Swelling    Throat swelling    Medications Prior to Admission  Medication Sig Dispense Refill Last Dose  . acetaminophen (TYLENOL) 500 MG tablet Take 2 tablets (1,000 mg total) by mouth every 6 (six) hours as needed. 30 tablet 0 Taking  . cyclobenzaprine (FLEXERIL) 10 MG tablet Take 1 tablet (10 mg total) by mouth every 8 (eight) hours as needed for muscle spasms. 20 tablet 0   . docusate sodium (COLACE) 100 MG capsule Take 100 mg by mouth 2 (two) times daily.    Taking  . Menthol, Topical Analgesic, (BIOFREEZE) 4 % GEL Applied per package instructions. 1 Tube  0 Taking  . ondansetron (ZOFRAN ODT) 4 MG disintegrating tablet Take 1 tablet (4 mg total) by mouth every 6 (six) hours as needed for nausea. 20 tablet 2 Taking  . Prenatal Vit-Fe Fumarate-FA (PRENATAL MULTIVITAMIN) TABS tablet Take 1 tablet by mouth daily at 12 noon.   Taking  . promethazine (PHENERGAN) 25 MG tablet Take 1 tablet (25 mg total) by mouth every 6 (six) hours as needed for nausea or vomiting. 30 tablet 2 Taking    Review of Systems  All other systems reviewed and are negative.  Physical Exam   Blood pressure 101/61, pulse 85, temperature 98.3 F (36.8 C), temperature source Oral, resp. rate 20, height 5\' 5"  (1.651 m), weight 87.5 kg, last menstrual period 06/30/2017, SpO2 99 %.  Physical Exam  Constitutional: She is oriented to person, place, and time. She appears well-developed and well-nourished. No distress.  HENT:  Head: Normocephalic  and atraumatic.  Eyes: Pupils are equal, round, and reactive to light. Right eye exhibits no discharge. Left eye exhibits no discharge. No scleral icterus.  Cardiovascular: Normal rate and regular rhythm.  Respiratory: Effort normal.  GI: Soft. There is no tenderness.  gravid  Genitourinary: Vagina normal and uterus normal.  Genitourinary Comments: Cervical exam: ft/thick/-3  Neurological: She is alert and oriented to person, place, and time.  Skin: She is not diaphoretic.  Psychiatric: She has a normal mood and affect. Her behavior is normal. Judgment and thought content normal.      MAU Course  Procedures  MDM Patient with intermittent abdominal pain  NST: baseline 140s, moderate variability, + acels, no decels, no contractions Reports that she has had the pain while she's here and she's heard the baby moving on the monitor when the pain comes, so now thinks it's fetal movement  Assessment and Plan  Abdominal pain affecting pregnancy - Plan: Discharge patient - cervical exam and NST reassuring that pt is not in preterm labor - discussed return precautions in detail - follow up at next scheduled appt or PRN    Gwenevere Abbot 01/15/2018, 10:47 PM

## 2018-01-19 ENCOUNTER — Inpatient Hospital Stay (HOSPITAL_COMMUNITY)
Admission: AD | Admit: 2018-01-19 | Discharge: 2018-01-19 | Disposition: A | Discharge status: Home / Self Care | Payer: Medicaid Other | Source: Ambulatory Visit | Attending: Obstetrics & Gynecology | Admitting: Obstetrics & Gynecology

## 2018-01-19 ENCOUNTER — Encounter: Payer: Self-pay | Admitting: Obstetrics

## 2018-01-19 ENCOUNTER — Ambulatory Visit (INDEPENDENT_AMBULATORY_CARE_PROVIDER_SITE_OTHER): Payer: Medicaid Other | Admitting: Obstetrics

## 2018-01-19 ENCOUNTER — Encounter (HOSPITAL_COMMUNITY): Payer: Self-pay | Admitting: *Deleted

## 2018-01-19 VITALS — BP 119/75 | HR 96 | Wt 193.4 lb

## 2018-01-19 DIAGNOSIS — O99353 Diseases of the nervous system complicating pregnancy, third trimester: Secondary | ICD-10-CM | POA: Insufficient documentation

## 2018-01-19 DIAGNOSIS — O0992 Supervision of high risk pregnancy, unspecified, second trimester: Secondary | ICD-10-CM

## 2018-01-19 DIAGNOSIS — Z3A29 29 weeks gestation of pregnancy: Secondary | ICD-10-CM | POA: Diagnosis not present

## 2018-01-19 DIAGNOSIS — G43719 Chronic migraine without aura, intractable, without status migrainosus: Secondary | ICD-10-CM

## 2018-01-19 DIAGNOSIS — O99340 Other mental disorders complicating pregnancy, unspecified trimester: Secondary | ICD-10-CM

## 2018-01-19 DIAGNOSIS — O9989 Other specified diseases and conditions complicating pregnancy, childbirth and the puerperium: Secondary | ICD-10-CM

## 2018-01-19 DIAGNOSIS — Z87891 Personal history of nicotine dependence: Secondary | ICD-10-CM | POA: Insufficient documentation

## 2018-01-19 DIAGNOSIS — Z23 Encounter for immunization: Secondary | ICD-10-CM

## 2018-01-19 DIAGNOSIS — F419 Anxiety disorder, unspecified: Secondary | ICD-10-CM

## 2018-01-19 DIAGNOSIS — Z880 Allergy status to penicillin: Secondary | ICD-10-CM | POA: Diagnosis not present

## 2018-01-19 DIAGNOSIS — G43019 Migraine without aura, intractable, without status migrainosus: Secondary | ICD-10-CM | POA: Insufficient documentation

## 2018-01-19 DIAGNOSIS — O359XX0 Maternal care for (suspected) fetal abnormality and damage, unspecified, not applicable or unspecified: Secondary | ICD-10-CM

## 2018-01-19 DIAGNOSIS — R51 Headache: Secondary | ICD-10-CM | POA: Diagnosis present

## 2018-01-19 LAB — URINALYSIS, ROUTINE W REFLEX MICROSCOPIC
Bilirubin Urine: NEGATIVE
Glucose, UA: NEGATIVE mg/dL
Hgb urine dipstick: NEGATIVE
KETONES UR: NEGATIVE mg/dL
Nitrite: NEGATIVE
PROTEIN: NEGATIVE mg/dL
Specific Gravity, Urine: 1.03 (ref 1.005–1.030)
pH: 5 (ref 5.0–8.0)

## 2018-01-19 MED ORDER — LACTATED RINGERS IV BOLUS
1000.0000 mL | Freq: Once | INTRAVENOUS | Status: AC
  Administered 2018-01-19: 1000 mL via INTRAVENOUS

## 2018-01-19 MED ORDER — METOCLOPRAMIDE HCL 5 MG/ML IJ SOLN
10.0000 mg | Freq: Once | INTRAMUSCULAR | Status: AC
  Administered 2018-01-19: 10 mg via INTRAVENOUS
  Filled 2018-01-19: qty 2

## 2018-01-19 MED ORDER — DIPHENHYDRAMINE HCL 50 MG/ML IJ SOLN
12.5000 mg | Freq: Once | INTRAMUSCULAR | Status: AC
  Administered 2018-01-19: 12.5 mg via INTRAVENOUS
  Filled 2018-01-19: qty 1

## 2018-01-19 MED ORDER — DEXAMETHASONE SODIUM PHOSPHATE 10 MG/ML IJ SOLN
10.0000 mg | Freq: Once | INTRAMUSCULAR | Status: AC
  Administered 2018-01-19: 10 mg via INTRAVENOUS
  Filled 2018-01-19: qty 1

## 2018-01-19 NOTE — MAU Note (Signed)
Hx of migraines- has lasted for 4 days.  Takes tylenol with minimal relief.  No hx of any hypertension.  Was seen in the office today and her BP was 118/78.  Has also seen spots-normal for her migraines.  Last took 1 gram of tylenol at 1300.  No LOF/VB.  +FM.  Possibly feeling occasional ctx since sitting in the lobby.

## 2018-01-19 NOTE — Progress Notes (Signed)
Subjective:  Valerie [name] is a 27 y.o. [phone] at [redacted]w[redacted]d being seen today for ongoing prenatal care.  She is currently monitored for the following issues for this high-risk pregnancy and has Former smoker, stopped smoking in distant past; Family history of diabetes in pregnancy; History of facial weakness/Neuro workup/Lesion on MRI w/out change; Skin tag of perianal region; Depressive disorder, not elsewhere classified; Supervision of high risk pregnancy, antepartum, second trimester; Heart block; Unwanted fertility; Palpitations; Cardiac murmur; First degree AV block; Hereditary disease in family possibly affecting fetus, antepartum; and Fetal abnormality affecting management of mother, antepartum on their problem list.  Patient reports headache and anxiety.  Contractions: Irregular. Vag. Bleeding: None.  Movement: Present. Denies leaking of fluid.   The following portions of the patient's history were reviewed and updated as appropriate: allergies, current medications, past family history, past medical history, past social history, past surgical history and problem list. Problem list updated.  Objective:   Vitals:   01/19/18 1659  BP: 119/75  Pulse: 96  Weight: 193 lb 6.4 oz (87.7 kg)    Fetal Status:     Movement: Present     General:  Alert, oriented and cooperative. Patient is in no acute distress.  Skin: Skin is warm and dry. No rash noted.   Cardiovascular: Normal heart rate noted  Respiratory: Normal respiratory effort, no problems with respiration noted  Abdomen: Soft, gravid, appropriate for gestational age. Pain/Pressure: Present     Pelvic:  Cervical exam deferred        Extremities: Normal range of motion.  Edema: None  Mental Status: Normal mood and affect. Normal behavior. Normal judgment and thought content.   Urinalysis:      Assessment and Plan:  Pregnancy: G4P1021 at [redacted]w[redacted]d  1. Supervision of high risk pregnancy, antepartum, second trimester Rx: - Tdap vaccine  greater than or equal to 7yo IM  2. Fetal abnormality affecting management of mother, antepartum, single or unspecified fetus Rx:  3. Anxiety disorder affecting pregnancy, antepartum Rx: - escitalopram (LEXAPRO) 10 MG tablet; Take 1 tablet (10 mg total) by mouth daily.  Dispense: 30 tablet; Refill: 11  4. Intractable chronic migraine without aura and without status migrainosus - sent to [facility] for Migraine Ttreatment  Preterm labor symptoms and general obstetric precautions including but not limited to vaginal bleeding, contractions, leaking of fluid and fetal movement were reviewed in detail with the patient. Please refer to After Visit Summary for other counseling recommendations.  Return in about 2 weeks (around 02/02/2018) for ROB.   Brock Bad, MD

## 2018-01-19 NOTE — Discharge Instructions (Signed)
Migraine Headache A migraine headache is an intense, throbbing pain on one side or both sides of the head. Migraines may also cause other symptoms, such as nausea, vomiting, and sensitivity to light and noise. What are the causes? Doing or taking certain things may also trigger migraines, such as:  Alcohol.  Smoking.  Medicines, such as: ? Medicine used to treat chest pain (nitroglycerine). ? Birth control pills. ? Estrogen pills. ? Certain blood pressure medicines.  Aged cheeses, chocolate, or caffeine.  Foods or drinks that contain nitrates, glutamate, aspartame, or tyramine.  Physical activity.  Other things that may trigger a migraine include:  Menstruation.  Pregnancy.  Hunger.  Stress, lack of sleep, too much sleep, or fatigue.  Weather changes.  What increases the risk? The following factors may make you more likely to experience migraine headaches:  Age. Risk increases with age.  Family history of migraine headaches.  Being Caucasian.  Depression and anxiety.  Obesity.  Being a woman.  Having a hole in the heart (patent foramen ovale) or other heart problems.  What are the signs or symptoms? The main symptom of this condition is pulsating or throbbing pain. Pain may:  Happen in any area of the head, such as on one side or both sides.  Interfere with daily activities.  Get worse with physical activity.  Get worse with exposure to bright lights or loud noises.  Other symptoms may include:  Nausea.  Vomiting.  Dizziness.  General sensitivity to bright lights, loud noises, or smells.  Before you get a migraine, you may get warning signs that a migraine is developing (aura). An aura may include:  Seeing flashing lights or having blind spots.  Seeing bright spots, halos, or zigzag lines.  Having tunnel vision or blurred vision.  Having numbness or a tingling feeling.  Having trouble talking.  Having muscle weakness.  How is this  diagnosed? A migraine headache can be diagnosed based on:  Your symptoms.  A physical exam.  Tests, such as CT scan or MRI of the head. These imaging tests can help rule out other causes of headaches.  Taking fluid from the spine (lumbar puncture) and analyzing it (cerebrospinal fluid analysis, or CSF analysis).  How is this treated? A migraine headache is usually treated with medicines that:  Relieve pain.  Relieve nausea.  Prevent migraines from coming back.  Treatment may also include:  Acupuncture.  Lifestyle changes like avoiding foods that trigger migraines.  Follow these instructions at home: Medicines  Take over-the-counter and prescription medicines only as told by your health care provider.  Do not drive or use heavy machinery while taking prescription pain medicine.  To prevent or treat constipation while you are taking prescription pain medicine, your health care provider may recommend that you: ? Drink enough fluid to keep your urine clear or pale yellow. ? Take over-the-counter or prescription medicines. ? Eat foods that are high in fiber, such as fresh fruits and vegetables, whole grains, and beans. ? Limit foods that are high in fat and processed sugars, such as fried and sweet foods. Lifestyle  Avoid alcohol use.  Do not use any products that contain nicotine or tobacco, such as cigarettes and e-cigarettes. If you need help quitting, ask your health care provider.  Get at least 8 hours of sleep every night.  Limit your stress. General instructions   Keep a journal to find out what may trigger your migraine headaches. For example, write down: ? What you eat and   drink. ? How much sleep you get. ? Any change to your diet or medicines.  If you have a migraine: ? Avoid things that make your symptoms worse, such as bright lights. ? It may help to lie down in a dark, quiet room. ? Do not drive or use heavy machinery. ? Ask your health care provider  what activities are safe for you while you are experiencing symptoms.  Keep all follow-up visits as told by your health care provider. This is important. Contact a health care provider if:  You develop symptoms that are different or more severe than your usual migraine symptoms. Get help right away if:  Your migraine becomes severe.  You have a fever.  You have a stiff neck.  You have vision loss.  Your muscles feel weak or like you cannot control them.  You start to lose your balance often.  You develop trouble walking.  You faint. This information is not intended to replace advice given to you by your health care provider. Make sure you discuss any questions you have with your health care provider. Document Released: 03/04/2005 Document Revised: 09/22/2015 Document Reviewed: 08/21/2015 Elsevier Interactive Patient Education  2017 [employer]   

## 2018-01-19 NOTE — MAU Provider Note (Signed)
History     CSN: [id number]  Arrival date and time: 01/19/18 4098   First Provider Initiated Contact with Patient 01/19/18 2020      Chief Complaint  Patient presents with  . Headache   HPI   Valerie [name] is a 27 y.o. female [phone] @ [redacted]w[redacted]d here in MAU with a 4 day history of migraine HA. Says the HA actually started 1 week ago however worsened into a migraine 4 days ago. Says she has vomited 2x today. Says her symptoms are consistent with migraines that she has had in the past. + fetal movement, no bleeding. Tried taking 1 gram of tylenol which helped only minimally. Has done the HA cocktail in the past which has worked well.   OB History    Gravida  4   Para  1   Term  1   Preterm      AB  2   Living  1     SAB  2   TAB      Ectopic      Multiple      Live Births  1           Past Medical History:  Diagnosis Date  . Abnormal Pap smear   . Anemia   . Anxiety   . Bell's palsy   . BV (bacterial vaginosis)   . Eczema   . First degree heart block   . Gastric ulcer    2000  . Heart murmur   . Infection    UTI, chronic sinus inf  . Migraine   . Mitral valve regurgitation   . Ovarian cyst   . Tricuspid valve regurgitation   . UTI (urinary tract infection)     Past Surgical History:  Procedure Laterality Date  . ADENOIDECTOMY    . COLPOSCOPY    . CRYOTHERAPY    . LEEP    . MYRINGOTOMY WITH TUBE PLACEMENT      Family History  Problem Relation Age of Onset  . Hypertension Mother   . Diabetes Father   . Hypertension Father   . Cancer Paternal Aunt        female  . Diabetes Paternal Uncle   . Diabetes Paternal Grandmother   . Hypertension Paternal Grandmother   . Diabetes Paternal Grandfather   . Hypertension Paternal Grandfather     Social History   Tobacco Use  . Smoking status: Former Smoker    Packs/day: 0.25    Types: Cigarettes    Last attempt to quit: 08/05/2017    Years since quitting: 0.4  . Smokeless tobacco:  Never Used  Substance Use Topics  . Alcohol use: Never    Frequency: Never  . Drug use: No    Allergies:  Allergies  Allergen Reactions  . Augmentin [Amoxicillin-Pot Clavulanate] Diarrhea and Nausea And Vomiting       . Ciprofloxacin Hives  . Doxycycline Hives  . Hydrocodone Hives  . Prednisone     Had a reaction to high doses and was told not to take again  . Raspberry Swelling    Throat swelling    Medications Prior to Admission  Medication Sig Dispense Refill Last Dose  . acetaminophen (TYLENOL) 500 MG tablet Take 2 tablets (1,000 mg total) by mouth every 6 (six) hours as needed. 30 tablet 0 01/19/2018 at Unknown time  . cyclobenzaprine (FLEXERIL) 10 MG tablet Take 1 tablet (10 mg total) by mouth every 8 (eight) hours as needed for  muscle spasms. 20 tablet 0 Past Week at Unknown time  . Menthol, Topical Analgesic, (BIOFREEZE) 4 % GEL Applied per package instructions. 1 Tube 0 Past Week at Unknown time  . ondansetron (ZOFRAN ODT) 4 MG disintegrating tablet Take 1 tablet (4 mg total) by mouth every 6 (six) hours as needed for nausea. 20 tablet 2 01/18/2018 at Unknown time  . Prenatal Vit-Fe Fumarate-FA (PRENATAL MULTIVITAMIN) TABS tablet Take 1 tablet by mouth daily at 12 noon.   01/19/2018 at Unknown time  . promethazine (PHENERGAN) 25 MG tablet Take 1 tablet (25 mg total) by mouth every 6 (six) hours as needed for nausea or vomiting. 30 tablet 2 Unknown at Unknown time   Results for orders placed or performed during the hospital encounter of 01/19/18 (from the past 48 hour(s))  Urinalysis, Routine w reflex microscopic     Status: Abnormal   Collection Time: 01/19/18  7:09 PM  Result Value Ref Range   Color, Urine YELLOW YELLOW   APPearance HAZY (A) CLEAR   Specific Gravity, Urine 1.030 1.005 - 1.030   pH 5.0 5.0 - 8.0   Glucose, UA NEGATIVE NEGATIVE mg/dL   Hgb urine dipstick NEGATIVE NEGATIVE   Bilirubin Urine NEGATIVE NEGATIVE   Ketones, ur NEGATIVE NEGATIVE mg/dL    Protein, ur NEGATIVE NEGATIVE mg/dL   Nitrite NEGATIVE NEGATIVE   Leukocytes, UA TRACE (A) NEGATIVE   RBC / HPF 0-5 0 - 5 RBC/hpf   WBC, UA 0-5 0 - 5 WBC/hpf   Bacteria, UA RARE (A) NONE SEEN   Squamous Epithelial / LPF 6-10 0 - 5   Mucus PRESENT     Comment: Performed at [facility], [address] [place], [place] [address]   Review of Systems  Constitutional: Negative for fever.  Eyes: Positive for photophobia.  Gastrointestinal: Positive for nausea and vomiting. Negative for diarrhea.  Neurological: Positive for headaches.   Physical Exam   Blood pressure 110/74, pulse 80, temperature 98.1 F (36.7 C), resp. rate 16, height 5\' 5"  (1.651 m), weight 88.1 kg, last menstrual period 06/30/2017, SpO2 100 %.  Physical Exam  Constitutional: She is oriented to person, place, and time. She appears well-developed and well-nourished. No distress.  HENT:  Head: Normocephalic.  Eyes: Pupils are equal, round, and reactive to light.  GI: Soft. She exhibits no distension. There is no tenderness. There is no rebound.  Musculoskeletal: Normal range of motion.  Neurological: She is alert and oriented to person, place, and time. GCS eye subscore is 4. GCS verbal subscore is 5. GCS motor subscore is 6.  Skin: Skin is warm. She is not diaphoretic.  Psychiatric: Her behavior is normal.   Fetal Tracing: Baseline: 135 bpm Variability: Moderate  Accelerations: 15x15 Decelerations: None Toco: None  MAU Course  Procedures None   MDM  HA cocktail given: Decadron>Benadryl>Reglan> LR bolus Patient much better. HA down from 6/10 to 2/10. BP WNL   Assessment and Plan  A:  1. Intractable migraine without aura and without status migrainosus   2. [redacted] weeks gestation of pregnancy     P:  Discharge home in stable condition Return to MAU if symptoms worsen Contact given to patient to see Nada Maclachlan PA for migraine management   Rasch, Harolyn Rutherford, NP 01/20/2018 10:17 AM

## 2018-01-19 NOTE — Progress Notes (Signed)
Patient reports headaches for the past 4 days; SOB while in the waiting room - anxiety, nesting;

## 2018-01-19 NOTE — Patient Instructions (Signed)
Tdap Vaccine (Tetanus, Diphtheria and Pertussis): What You Need to Know 1. Why get vaccinated? Tetanus, diphtheria and pertussis are very serious diseases. Tdap vaccine can protect us from these diseases. And, Tdap vaccine given to pregnant women can protect newborn babies against pertussis. TETANUS (Lockjaw) is rare in the [place] today. It causes painful muscle tightening and stiffness, usually all over the body.  It can lead to tightening of muscles in the head and neck so you can't open your mouth, swallow, or sometimes even breathe. Tetanus kills about 1 out of 10 people who are infected even after receiving the best medical care.  DIPHTHERIA is also rare in the [place] today. It can cause a thick coating to form in the back of the throat.  It can lead to breathing problems, heart failure, paralysis, and death.  PERTUSSIS (Whooping Cough) causes severe coughing spells, which can cause difficulty breathing, vomiting and disturbed sleep.  It can also lead to weight loss, incontinence, and rib fractures. Up to 2 in 100 adolescents and 5 in 100 adults with pertussis are hospitalized or have complications, which could include pneumonia or death.  These diseases are caused by bacteria. Diphtheria and pertussis are spread from person to person through secretions from coughing or sneezing. Tetanus enters the body through cuts, scratches, or wounds. Before vaccines, as many as 200,000 cases of diphtheria, 200,000 cases of pertussis, and hundreds of cases of tetanus, were reported in the [place] each year. Since vaccination began, reports of cases for tetanus and diphtheria have dropped by about 99% and for pertussis by about 80%. 2. Tdap vaccine Tdap vaccine can protect adolescents and adults from tetanus, diphtheria, and pertussis. One dose of Tdap is routinely given at age 11 or 12. People who did not get Tdap at that age should get it as soon as possible. Tdap is especially  important for healthcare professionals and anyone having close contact with a baby younger than 12 months. Pregnant women should get a dose of Tdap during every pregnancy, to protect the newborn from pertussis. Infants are most at risk for severe, life-threatening complications from pertussis. Another vaccine, called Td, protects against tetanus and diphtheria, but not pertussis. A Td booster should be given every 10 years. Tdap may be given as one of these boosters if you have never gotten Tdap before. Tdap may also be given after a severe cut or burn to prevent tetanus infection. Your doctor or the person giving you the vaccine can give you more information. Tdap may safely be given at the same time as other vaccines. 3. Some people should not get this vaccine  A person who has ever had a life-threatening allergic reaction after a previous dose of any diphtheria, tetanus or pertussis containing vaccine, OR has a severe allergy to any part of this vaccine, should not get Tdap vaccine. Tell the person giving the vaccine about any severe allergies.  Anyone who had coma or long repeated seizures within 7 days after a childhood dose of DTP or DTaP, or a previous dose of Tdap, should not get Tdap, unless a cause other than the vaccine was found. They can still get Td.  Talk to your doctor if you: ? have seizures or another nervous system problem, ? had severe pain or swelling after any vaccine containing diphtheria, tetanus or pertussis, ? ever had a condition called Guillain-Barr Syndrome (GBS), ? aren't feeling well on the day the shot is scheduled. 4. Risks With any medicine, including   vaccines, there is a chance of side effects. These are usually mild and go away on their own. Serious reactions are also possible but are rare. Most people who get Tdap vaccine do not have any problems with it. Mild problems following Tdap: (Did not interfere with activities)  Pain where the shot was given (about  3 in 4 adolescents or 2 in 3 adults)  Redness or swelling where the shot was given (about 1 person in 5)  Mild fever of at least 100.4F (up to about 1 in 25 adolescents or 1 in 100 adults)  Headache (about 3 or 4 people in 10)  Tiredness (about 1 person in 3 or 4)  Nausea, vomiting, diarrhea, stomach ache (up to 1 in 4 adolescents or 1 in 10 adults)  Chills, sore joints (about 1 person in 10)  Body aches (about 1 person in 3 or 4)  Rash, swollen glands (uncommon)  Moderate problems following Tdap: (Interfered with activities, but did not require medical attention)  Pain where the shot was given (up to 1 in 5 or 6)  Redness or swelling where the shot was given (up to about 1 in 16 adolescents or 1 in 12 adults)  Fever over 102F (about 1 in 100 adolescents or 1 in 250 adults)  Headache (about 1 in 7 adolescents or 1 in 10 adults)  Nausea, vomiting, diarrhea, stomach ache (up to 1 or 3 people in 100)  Swelling of the entire arm where the shot was given (up to about 1 in 500).  Severe problems following Tdap: (Unable to perform usual activities; required medical attention)  Swelling, severe pain, bleeding and redness in the arm where the shot was given (rare).  Problems that could happen after any vaccine:  People sometimes faint after a medical procedure, including vaccination. Sitting or lying down for about 15 minutes can help prevent fainting, and injuries caused by a fall. Tell your doctor if you feel dizzy, or have vision changes or ringing in the ears.  Some people get severe pain in the shoulder and have difficulty moving the arm where a shot was given. This happens very rarely.  Any medication can cause a severe allergic reaction. Such reactions from a vaccine are very rare, estimated at fewer than 1 in a million doses, and would happen within a few minutes to a few hours after the vaccination. As with any medicine, there is a very remote chance of a vaccine  causing a serious injury or death. The safety of vaccines is always being monitored. For more information, visit: www.cdc.gov/vaccinesafety/ 5. What if there is a serious problem? What should I look for? Look for anything that concerns you, such as signs of a severe allergic reaction, very high fever, or unusual behavior. Signs of a severe allergic reaction can include hives, swelling of the face and throat, difficulty breathing, a fast heartbeat, dizziness, and weakness. These would usually start a few minutes to a few hours after the vaccination. What should I do?  If you think it is a severe allergic reaction or other emergency that can't wait, call 9-1-1 or get the person to the nearest hospital. Otherwise, call your doctor.  Afterward, the reaction should be reported to the Vaccine Adverse Event Reporting System (VAERS). Your doctor might file this report, or you can do it yourself through the VAERS web site at www.vaers.hhs.gov, or by calling 1-800-822-7967. ? VAERS does not give medical advice. 6. The National Vaccine Injury Compensation Program The National   Vaccine Injury Compensation Program (VICP) is a federal program that was created to compensate people who may have been injured by certain vaccines. Persons who believe they may have been injured by a vaccine can learn about the program and about filing a claim by calling 1-800-338-2382 or visiting the VICP website at www.hrsa.gov/vaccinecompensation. There is a time limit to file a claim for compensation. 7. How can I learn more?  Ask your doctor. He or she can give you the vaccine package insert or suggest other sources of information.  Call your local or state health department.  Contact the Centers for Disease Control and Prevention (CDC): ? Call 1-800-232-4636 (1-800-CDC-INFO) or ? Visit CDC's website at www.cdc.gov/vaccines CDC Tdap Vaccine VIS (05/11/13) This information is not intended to replace advice given to you by your  health care provider. Make sure you discuss any questions you have with your health care provider. Document Released: 09/03/2011 Document Revised: 11/23/2015 Document Reviewed: 11/23/2015 Elsevier Interactive Patient Education  2017 Elsevier Inc.  

## 2018-01-20 ENCOUNTER — Encounter: Payer: Self-pay | Admitting: Obstetrics

## 2018-01-20 ENCOUNTER — Telehealth: Payer: Self-pay | Admitting: Obstetrics

## 2018-01-20 MED ORDER — ESCITALOPRAM OXALATE 10 MG PO TABS: 10.0000 mg | ORAL_TABLET | Freq: Every day | ORAL | 11 refills | Status: DC

## 2018-01-30 ENCOUNTER — Ambulatory Visit: Payer: Medicaid Other

## 2018-01-30 VITALS — BP 119/79 | HR 87 | Wt 191.4 lb

## 2018-01-30 DIAGNOSIS — O2340 Unspecified infection of urinary tract in pregnancy, unspecified trimester: Secondary | ICD-10-CM

## 2018-01-30 MED ORDER — NITROFURANTOIN MONOHYD MACRO 100 MG PO CAPS: 100.0000 mg | ORAL_CAPSULE | Freq: Two times a day (BID) | ORAL | 1 refills | Status: DC

## 2018-01-30 NOTE — Addendum Note (Signed)
Addended by: Kennon PortelaLASH, LATOYA M on: 01/30/2018 11:13 AM   Modules accepted: Orders

## 2018-01-30 NOTE — Progress Notes (Signed)
SUBJECTIVE: Valerie [name] is a 27 y.o. female who complains of urinary frequency, urgency and dysuria x a few  days, without flank pain, fever, chills, or abnormal vaginal discharge or bleeding.   OBJECTIVE: Appears well, in no apparent distress.  Vital signs are normal. Urine dipstick shows not done.  DUE TO BEING ORANGE FROM MEDICATION PT TOOK   ASSESSMENT: Dysuria  PLAN: Treatment per orders.  Call or return to clinic prn if these symptoms worsen or fail to improve as anticipated. Consulted w/ provider in office will treat pt today due to sx's of burning.

## 2018-02-01 LAB — URINE CULTURE, OB REFLEX

## 2018-02-01 LAB — CULTURE, OB URINE

## 2018-02-04 ENCOUNTER — Encounter: Payer: Self-pay | Admitting: Obstetrics

## 2018-02-04 ENCOUNTER — Ambulatory Visit (INDEPENDENT_AMBULATORY_CARE_PROVIDER_SITE_OTHER): Payer: Medicaid Other | Admitting: Obstetrics

## 2018-02-04 VITALS — BP 117/79 | HR 87 | Wt 192.8 lb

## 2018-02-04 DIAGNOSIS — I440 Atrioventricular block, first degree: Secondary | ICD-10-CM

## 2018-02-04 DIAGNOSIS — O0992 Supervision of high risk pregnancy, unspecified, second trimester: Secondary | ICD-10-CM

## 2018-02-04 DIAGNOSIS — K219 Gastro-esophageal reflux disease without esophagitis: Secondary | ICD-10-CM

## 2018-02-04 MED ORDER — OMEPRAZOLE 20 MG PO CPDR: 20.0000 mg | DELAYED_RELEASE_CAPSULE | Freq: Two times a day (BID) | ORAL | 5 refills | Status: DC

## 2018-02-04 NOTE — Progress Notes (Signed)
Pt is here for ROB. G4P1 4671w2d.

## 2018-02-16 ENCOUNTER — Encounter: Payer: Self-pay | Admitting: Obstetrics

## 2018-02-16 ENCOUNTER — Ambulatory Visit (INDEPENDENT_AMBULATORY_CARE_PROVIDER_SITE_OTHER): Payer: Medicaid Other | Admitting: Obstetrics

## 2018-02-16 VITALS — BP 112/78 | HR 82 | Wt 193.0 lb

## 2018-02-16 DIAGNOSIS — O0992 Supervision of high risk pregnancy, unspecified, second trimester: Secondary | ICD-10-CM

## 2018-02-16 DIAGNOSIS — D508 Other iron deficiency anemias: Secondary | ICD-10-CM

## 2018-02-16 DIAGNOSIS — O359XX0 Maternal care for (suspected) fetal abnormality and damage, unspecified, not applicable or unspecified: Secondary | ICD-10-CM

## 2018-02-16 DIAGNOSIS — I440 Atrioventricular block, first degree: Secondary | ICD-10-CM

## 2018-02-16 NOTE — Progress Notes (Signed)
Subjective:  Valerie RougeSarah B Ponciano is a 27 y.o. [phone] [id number] at 7175w0d being seen today for ongoing prenatal care.  She is currently monitored for the following issues for this high-risk pregnancy and has Former smoker, stopped smoking in distant past; Family history of diabetes in pregnancy; History of facial weakness/Neuro workup/Lesion on MRI w/out change; Skin tag of perianal region; Depressive disorder, not elsewhere classified; Supervision of high risk pregnancy, antepartum, second trimester; Heart block; Unwanted fertility; Palpitations; Cardiac murmur; First degree AV block; Hereditary disease in family possibly affecting fetus, antepartum; and Fetal abnormality affecting management of mother, antepartum on their problem list.  Patient reports backache.  Contractions: Irregular. Vag. Bleeding: None.  Movement: Present. Denies leaking of fluid.   The following portions of the patient's history were reviewed and updated as appropriate: allergies, current medications, past family history, past medical history, past social history, past surgical history and problem list. Problem list updated.  Objective:   Vitals:   02/16/18 1555  BP: 112/78  Pulse: 82  Weight: 193 lb (87.5 kg)    Fetal Status: Fetal Heart Rate (bpm): 150   Movement: Present     General:  Alert, oriented and cooperative. Patient is in no acute distress.  Skin: Skin is warm and dry. No rash noted.   Cardiovascular: Normal heart rate noted  Respiratory: Normal respiratory effort, no problems with respiration noted  Abdomen: Soft, gravid, appropriate for gestational age. Pain/Pressure: Present     Pelvic:  Cervical exam deferred        Extremities: Normal range of motion.  Edema: None  Mental Status: Normal mood and affect. Normal behavior. Normal judgment and thought content.   Urinalysis:      Assessment and Plan:  Pregnancy: G4P1021 at 7175w0d  1. Supervision of high risk pregnancy, antepartum, second trimester  2. Fetal  abnormality affecting management of mother, antepartum, single or unspecified fetus  3. First degree AV block  4. Iron deficiency anemia secondary to inadequate dietary iron intake Rx: - Ferritin   Preterm labor symptoms and general obstetric precautions including but not limited to vaginal bleeding, contractions, leaking of fluid and fetal movement were reviewed in detail with the patient. Please refer to After Visit Summary for other counseling recommendations.  Return in about 2 weeks (around 03/02/2018) for ROB.   Brock [name], Charles A, MD

## 2018-02-17 ENCOUNTER — Other Ambulatory Visit: Payer: Self-pay | Admitting: Obstetrics

## 2018-02-17 ENCOUNTER — Telehealth: Payer: Self-pay

## 2018-02-17 DIAGNOSIS — D508 Other iron deficiency anemias: Secondary | ICD-10-CM

## 2018-02-17 LAB — FERRITIN: Ferritin: 11 ng/mL — ABNORMAL LOW (ref 15–150)

## 2018-02-17 MED ORDER — FERROUS SULFATE 325 (65 FE) MG PO TABS: 325.0000 mg | ORAL_TABLET | Freq: Two times a day (BID) | ORAL | 5 refills | Status: DC

## 2018-02-17 NOTE — Telephone Encounter (Signed)
Advised of results and rx sent 

## 2018-02-24 ENCOUNTER — Inpatient Hospital Stay (HOSPITAL_COMMUNITY)
Admission: AD | Admit: 2018-02-24 | Discharge: 2018-02-24 | Disposition: A | Discharge status: Home / Self Care | Payer: Medicaid Other | Attending: Family Medicine | Admitting: Family Medicine

## 2018-02-24 ENCOUNTER — Encounter (HOSPITAL_COMMUNITY): Payer: Self-pay | Admitting: *Deleted

## 2018-02-24 DIAGNOSIS — O4703 False labor before 37 completed weeks of gestation, third trimester: Secondary | ICD-10-CM | POA: Diagnosis not present

## 2018-02-24 DIAGNOSIS — Z87891 Personal history of nicotine dependence: Secondary | ICD-10-CM | POA: Diagnosis not present

## 2018-02-24 DIAGNOSIS — Z8249 Family history of ischemic heart disease and other diseases of the circulatory system: Secondary | ICD-10-CM | POA: Diagnosis not present

## 2018-02-24 DIAGNOSIS — Z79899 Other long term (current) drug therapy: Secondary | ICD-10-CM | POA: Diagnosis not present

## 2018-02-24 DIAGNOSIS — O99343 Other mental disorders complicating pregnancy, third trimester: Secondary | ICD-10-CM | POA: Diagnosis not present

## 2018-02-24 DIAGNOSIS — F419 Anxiety disorder, unspecified: Secondary | ICD-10-CM | POA: Diagnosis not present

## 2018-02-24 DIAGNOSIS — R011 Cardiac murmur, unspecified: Secondary | ICD-10-CM | POA: Insufficient documentation

## 2018-02-24 DIAGNOSIS — O479 False labor, unspecified: Secondary | ICD-10-CM

## 2018-02-24 DIAGNOSIS — Z8719 Personal history of other diseases of the digestive system: Secondary | ICD-10-CM | POA: Diagnosis not present

## 2018-02-24 DIAGNOSIS — O99413 Diseases of the circulatory system complicating pregnancy, third trimester: Secondary | ICD-10-CM | POA: Diagnosis not present

## 2018-02-24 DIAGNOSIS — I340 Nonrheumatic mitral (valve) insufficiency: Secondary | ICD-10-CM | POA: Diagnosis not present

## 2018-02-24 DIAGNOSIS — R102 Pelvic and perineal pain: Secondary | ICD-10-CM | POA: Diagnosis present

## 2018-02-24 DIAGNOSIS — I440 Atrioventricular block, first degree: Secondary | ICD-10-CM | POA: Insufficient documentation

## 2018-02-24 DIAGNOSIS — Z3A34 34 weeks gestation of pregnancy: Secondary | ICD-10-CM | POA: Insufficient documentation

## 2018-02-24 LAB — URINALYSIS, ROUTINE W REFLEX MICROSCOPIC
Bilirubin Urine: NEGATIVE
GLUCOSE, UA: NEGATIVE mg/dL
HGB URINE DIPSTICK: NEGATIVE
Ketones, ur: NEGATIVE mg/dL
NITRITE: NEGATIVE
PROTEIN: 30 mg/dL — AB
SPECIFIC GRAVITY, URINE: 1.028 (ref 1.005–1.030)
pH: 5 (ref 5.0–8.0)

## 2018-02-24 NOTE — MAU Provider Note (Addendum)
History     CSN: [id number]  Arrival date and time: 02/24/18 2028   First Provider Initiated Contact with Patient 02/24/18 2111      Chief Complaint  Patient presents with  . Vaginal Pain   Valerie [name] is a 27 y.o. [phone] at [redacted]w[redacted]d who presents today with vaginal pressure and an overall concern that "she doesn't feel right". She states that last week her son had a tonsillectomy, and this has been very stressful. She states that they have had to go back to the ER for fluids and pain management for him, and that he has been not coping well. Since then she has had a lot more pelvic pressure. She thinks she may be having contraction. She denies any VB or LOF. She reports normal fetal movement. Next OB visit is next week.   Vaginal Pain  The patient's pertinent negatives include no pelvic pain or vaginal discharge. This is a new problem. The current episode started yesterday. The problem occurs intermittently. The problem has been unchanged. Pain severity now: 4/10. She is pregnant. Associated symptoms include diarrhea (sporatic ). Pertinent negatives include no chills, dysuria, fever, frequency, nausea or vomiting. The vaginal discharge was normal. There has been no bleeding. The symptoms are aggravated by activity. She has tried nothing for the symptoms.    OB History    Gravida  4   Para  1   Term  1   Preterm      AB  2   Living  1     SAB  2   TAB      Ectopic      Multiple      Live Births  1           Past Medical History:  Diagnosis Date  . Abnormal Pap smear   . Anemia   . Anxiety   . Bell's palsy   . BV (bacterial vaginosis)   . Eczema   . First degree heart block   . Gastric ulcer    2000  . Heart murmur   . Infection    UTI, chronic sinus inf  . Migraine   . Mitral valve regurgitation   . Ovarian cyst   . Tricuspid valve regurgitation   . UTI (urinary tract infection)     Past Surgical History:  Procedure Laterality Date  .  ADENOIDECTOMY    . COLPOSCOPY    . CRYOTHERAPY    . LEEP    . MYRINGOTOMY WITH TUBE PLACEMENT      Family History  Problem Relation Age of Onset  . Hypertension Mother   . Diabetes Father   . Hypertension Father   . Cancer Paternal Aunt        female  . Diabetes Paternal Uncle   . Diabetes Paternal Grandmother   . Hypertension Paternal Grandmother   . Diabetes Paternal Grandfather   . Hypertension Paternal Grandfather     Social History   Tobacco Use  . Smoking status: Former Smoker    Packs/day: 0.25    Types: Cigarettes    Last attempt to quit: 08/05/2017    Years since quitting: 0.5  . Smokeless tobacco: Never Used  Substance Use Topics  . Alcohol use: Never    Frequency: Never  . Drug use: No    Allergies:  Allergies  Allergen Reactions  . Augmentin [Amoxicillin-Pot Clavulanate] Diarrhea and Nausea And Vomiting       . Ciprofloxacin Hives  . Doxycycline  Hives  . Hydrocodone Hives  . Prednisone     Had a reaction to high doses and was told not to take again  . Raspberry Swelling    Throat swelling    Medications Prior to Admission  Medication Sig Dispense Refill Last Dose  . acetaminophen (TYLENOL) 500 MG tablet Take 2 tablets (1,000 mg total) by mouth every 6 (six) hours as needed. 30 tablet 0 Past Week at Unknown time  . cyclobenzaprine (FLEXERIL) 10 MG tablet Take 1 tablet (10 mg total) by mouth every 8 (eight) hours as needed for muscle spasms. 20 tablet 0 Past Month at Unknown time  . escitalopram (LEXAPRO) 10 MG tablet Take 1 tablet (10 mg total) by mouth daily. 30 tablet 11 02/24/2018 at Unknown time  . Menthol, Topical Analgesic, (BIOFREEZE) 4 % GEL Applied per package instructions. 1 Tube 0 02/23/2018 at Unknown time  . ondansetron (ZOFRAN) 8 MG tablet Take by mouth every 8 (eight) hours as needed for nausea or vomiting.   02/23/2018 at Unknown time  . Prenatal Vit-Fe Fumarate-FA (PRENATAL MULTIVITAMIN) TABS tablet Take 1 tablet by mouth daily at 12  noon.   02/24/2018 at Unknown time  . promethazine (PHENERGAN) 25 MG tablet Take 1 tablet (25 mg total) by mouth every 6 (six) hours as needed for nausea or vomiting. 30 tablet 2 Past Month at Unknown time  . ferrous sulfate 325 (65 FE) MG tablet Take 1 tablet (325 mg total) by mouth 2 (two) times daily with a meal. 60 tablet 5 More than a month at Unknown time  . nitrofurantoin, macrocrystal-monohydrate, (MACROBID) 100 MG capsule Take 1 capsule (100 mg total) by mouth 2 (two) times daily. 14 capsule 1 Taking  . omeprazole (PRILOSEC) 20 MG capsule Take 1 capsule (20 mg total) by mouth 2 (two) times daily before a meal. 60 capsule 5 Unknown at Unknown time    Review of Systems  Constitutional: Negative for chills and fever.  Gastrointestinal: Positive for diarrhea (sporatic ). Negative for nausea and vomiting.  Genitourinary: Positive for vaginal pain. Negative for dysuria, frequency, pelvic pain, vaginal bleeding and vaginal discharge.   Physical Exam   Blood pressure 111/69, pulse 96, temperature 98.2 F (36.8 C), temperature source Oral, height 5\' 5"  (1.651 m), weight 88.6 kg, last menstrual period 06/30/2017.  Physical Exam  Nursing note and vitals reviewed. Constitutional: She is oriented to person, place, and time. She appears well-developed and well-nourished. No distress.  HENT:  Head: Normocephalic.  Cardiovascular: Normal rate.  Respiratory: Effort normal.  GI: Soft. There is no tenderness. There is no rebound.  Genitourinary:  Genitourinary Comments:  Dilation: 1 Effacement (%): 50 Station: -1 Presentation: Vertex Exam by:: Thressa Sheller CNM    Neurological: She is alert and oriented to person, place, and time.  Skin: Skin is warm and dry.  Psychiatric: She has a normal mood and affect.    NST:  Baseline: 145 Variability: moderate Accels: 15x15 Decels: none Toco: occasional   Results for orders placed or performed during the hospital encounter of 02/24/18 (from  the past 24 hour(s))  Urinalysis, Routine w reflex microscopic     Status: Abnormal   Collection Time: 02/24/18  8:55 PM  Result Value Ref Range   Color, Urine YELLOW YELLOW   APPearance HAZY (A) CLEAR   Specific Gravity, Urine 1.028 1.005 - 1.030   pH 5.0 5.0 - 8.0   Glucose, UA NEGATIVE NEGATIVE mg/dL   Hgb urine dipstick NEGATIVE NEGATIVE   Bilirubin  Urine NEGATIVE NEGATIVE   Ketones, ur NEGATIVE NEGATIVE mg/dL   Protein, ur 30 (A) NEGATIVE mg/dL   Nitrite NEGATIVE NEGATIVE   Leukocytes, UA SMALL (A) NEGATIVE   RBC / HPF 0-5 0 - 5 RBC/hpf   WBC, UA 6-10 0 - 5 WBC/hpf   Bacteria, UA RARE (A) NONE SEEN   Squamous Epithelial / LPF 6-10 0 - 5   Mucus PRESENT     MAU Course  Procedures  MDM Patient with no change over more than one hour. Contractions irregular. Discussed PTL precautions.   Assessment and Plan   1. Braxton Hicks contractions   2. Threatened premature labor in third trimester   3. [redacted] weeks gestation of pregnancy    DC home Comfort measures reviewed  3rd Trimester precautions  PTL precautions  Fetal kick counts RX: none  Return to MAU as needed FU with OB as planned  Follow-up Information    [facility] Follow up.   Contact information: [address] [place] [id number] [phone]           Thressa ShellerHeather Aspyn Warnke 02/24/2018, 9:13 PM

## 2018-02-24 NOTE — MAU Note (Signed)
Pt states she cant really put a finger on what is wrong she just does not feel right.

## 2018-02-24 NOTE — Discharge Instructions (Signed)
Braxton Hicks Contractions °Contractions of the uterus can occur throughout pregnancy, but they are not always a sign that you are in labor. You may have practice contractions called Braxton Hicks contractions. These false labor contractions are sometimes confused with true labor. °What are Braxton Hicks contractions? °Braxton Hicks contractions are tightening movements that occur in the muscles of the uterus before labor. Unlike true labor contractions, these contractions do not result in opening (dilation) and thinning of the cervix. Toward the end of pregnancy (32-34 weeks), Braxton Hicks contractions can happen more often and may become stronger. These contractions are sometimes difficult to tell apart from true labor because they can be very uncomfortable. You should not feel embarrassed if you go to the hospital with false labor. °Sometimes, the only way to tell if you are in true labor is for your health care provider to look for changes in the cervix. The health care provider will do a physical exam and may monitor your contractions. If you are not in true labor, the exam should show that your cervix is not dilating and your water has not broken. °If there are other health problems associated with your pregnancy, it is completely safe for you to be sent home with false labor. You may continue to have Braxton Hicks contractions until you go into true labor. °How to tell the difference between true labor and false labor °True labor °· Contractions last 30-70 seconds. °· Contractions become very regular. °· Discomfort is usually felt in the top of the uterus, and it spreads to the lower abdomen and low back. °· Contractions do not go away with walking. °· Contractions usually become more intense and increase in frequency. °· The cervix dilates and gets thinner. °False labor °· Contractions are usually shorter and not as strong as true labor contractions. °· Contractions are usually irregular. °· Contractions  are often felt in the front of the lower abdomen and in the groin. °· Contractions may go away when you walk around or change positions while lying down. °· Contractions get weaker and are shorter-lasting as time goes on. °· The cervix usually does not dilate or become thin. °Follow these instructions at home: °· Take over-the-counter and prescription medicines only as told by your health care provider. °· Keep up with your usual exercises and follow other instructions from your health care provider. °· Eat and drink lightly if you think you are going into labor. °· If Braxton Hicks contractions are making you uncomfortable: °? Change your position from lying down or resting to walking, or change from walking to resting. °? Sit and rest in a tub of warm water. °? Drink enough fluid to keep your urine pale yellow. Dehydration may cause these contractions. °? Do slow and deep breathing several times an hour. °· Keep all follow-up prenatal visits as told by your health care provider. This is important. °Contact a health care provider if: °· You have a fever. °· You have continuous pain in your abdomen. °Get help right away if: °· Your contractions become stronger, more regular, and closer together. °· You have fluid leaking or gushing from your vagina. °· You pass blood-tinged mucus (bloody show). °· You have bleeding from your vagina. °· You have low back pain that you never had before. °· You feel your baby’s head pushing down and causing pelvic pressure. °· Your baby is not moving inside you as much as it used to. °Summary °· Contractions that occur before labor are called Braxton   Hicks contractions, false labor, or practice contractions. °· Braxton Hicks contractions are usually shorter, weaker, farther apart, and less regular than true labor contractions. True labor contractions usually become progressively stronger and regular and they become more frequent. °· Manage discomfort from Braxton Hicks contractions by  changing position, resting in a warm bath, drinking plenty of water, or practicing deep breathing. °This information is not intended to replace advice given to you by your health care provider. Make sure you discuss any questions you have with your health care provider. °Document Released: 07/18/2016 Document Revised: 07/18/2016 Document Reviewed: 07/18/2016 °Elsevier Interactive Patient Education © 2018 [employer] ° °

## 2018-02-24 NOTE — MAU Note (Signed)
PT SAYS  SHE FEELS PRESSURE  IN VAG .- STARTED  THIS AM - WORSE TONIGHT.   FEELING  SOME MILD  UC'S.   FEELS BABY MOVING.   PNC  WITH [facility] .     HAD VE  HERE  LAST VISIT.   DENIES HSV .  HAD MRSA- IN 2010.

## 2018-02-24 NOTE — MAU Note (Signed)
SAYS ON  Thursday -  HER 29 YO SON  HAS BEEN PUSHING   ON  HER ABD - AND KICKED  HER .

## 2018-02-26 LAB — CULTURE, OB URINE

## 2018-03-02 ENCOUNTER — Ambulatory Visit (INDEPENDENT_AMBULATORY_CARE_PROVIDER_SITE_OTHER): Payer: Medicaid Other | Admitting: Obstetrics

## 2018-03-02 ENCOUNTER — Encounter: Payer: Self-pay | Admitting: Obstetrics

## 2018-03-02 VITALS — BP 118/77 | HR 97 | Wt 195.3 lb

## 2018-03-02 DIAGNOSIS — O359XX0 Maternal care for (suspected) fetal abnormality and damage, unspecified, not applicable or unspecified: Secondary | ICD-10-CM

## 2018-03-02 DIAGNOSIS — O0992 Supervision of high risk pregnancy, unspecified, second trimester: Secondary | ICD-10-CM

## 2018-03-02 DIAGNOSIS — I440 Atrioventricular block, first degree: Secondary | ICD-10-CM

## 2018-03-02 NOTE — Progress Notes (Signed)
Subjective:  Valerie [name] is a 27 y.o. [phone] at 1360w0d being seen today for ongoing prenatal care.  She is currently monitored for the following issues for this high-risk pregnancy and has Former smoker, stopped smoking in distant past; Family history of diabetes in pregnancy; History of facial weakness/Neuro workup/Lesion on MRI w/out change; Skin tag of perianal region; Depressive disorder, not elsewhere classified; Supervision of high risk pregnancy, antepartum, second trimester; Heart block; Unwanted fertility; Palpitations; Cardiac murmur; First degree AV block; Hereditary disease in family possibly affecting fetus, antepartum; and Fetal abnormality affecting management of mother, antepartum on their problem list.  Patient reports no complaints.  Contractions: Irregular. Vag. Bleeding: None.  Movement: Present. Denies leaking of fluid.   The following portions of the patient's history were reviewed and updated as appropriate: allergies, current medications, past family history, past medical history, past social history, past surgical history and problem list. Problem list updated.  Objective:   Vitals:   03/02/18 1554  BP: 118/77  Pulse: 97  Weight: 195 lb 4.8 oz (88.6 kg)    Fetal Status:     Movement: Present     General:  Alert, oriented and cooperative. Patient is in no acute distress.  Skin: Skin is warm and dry. No rash noted.   Cardiovascular: Normal heart rate noted  Respiratory: Normal respiratory effort, no problems with respiration noted  Abdomen: Soft, gravid, appropriate for gestational age. Pain/Pressure: Present     Pelvic:  Cervical exam deferred        Extremities: Normal range of motion.  Edema: Trace  Mental Status: Normal mood and affect. Normal behavior. Normal judgment and thought content.   Urinalysis:        A/P:  1. Supervision of high risk pregnancy, antepartum, second trimester   2. Fetal abnormality affecting management of mother, antepartum,  single or unspecified fetus - EIF on ultrasound.  Normal NIPS  3. First degree AV block    Preterm labor symptoms and general obstetric precautions including but not limited to vaginal bleeding, contractions, leaking of fluid and fetal movement were reviewed in detail with the patient. Please refer to After Visit Summary for other counseling recommendations.  Return in about 1 week (around 03/09/2018) for ROB.   Brock [name], Geoffery Aultman A, MD

## 2018-03-04 ENCOUNTER — Inpatient Hospital Stay (HOSPITAL_COMMUNITY)
Admission: AD | Admit: 2018-03-04 | Discharge: 2018-03-04 | Disposition: A | Discharge status: Home / Self Care | Payer: Medicaid Other | Source: Ambulatory Visit | Attending: Obstetrics & Gynecology | Admitting: Obstetrics & Gynecology

## 2018-03-04 ENCOUNTER — Encounter (HOSPITAL_COMMUNITY): Payer: Self-pay | Admitting: *Deleted

## 2018-03-04 DIAGNOSIS — R51 Headache: Secondary | ICD-10-CM

## 2018-03-04 DIAGNOSIS — Z87891 Personal history of nicotine dependence: Secondary | ICD-10-CM | POA: Diagnosis not present

## 2018-03-04 DIAGNOSIS — O26893 Other specified pregnancy related conditions, third trimester: Secondary | ICD-10-CM | POA: Diagnosis not present

## 2018-03-04 DIAGNOSIS — Z880 Allergy status to penicillin: Secondary | ICD-10-CM | POA: Diagnosis not present

## 2018-03-04 DIAGNOSIS — Z3A35 35 weeks gestation of pregnancy: Secondary | ICD-10-CM | POA: Diagnosis not present

## 2018-03-04 DIAGNOSIS — R519 Headache, unspecified: Secondary | ICD-10-CM

## 2018-03-04 DIAGNOSIS — N898 Other specified noninflammatory disorders of vagina: Secondary | ICD-10-CM | POA: Insufficient documentation

## 2018-03-04 LAB — URINALYSIS, ROUTINE W REFLEX MICROSCOPIC
GLUCOSE, UA: NEGATIVE mg/dL
Hgb urine dipstick: NEGATIVE
Ketones, ur: 5 mg/dL — AB
Nitrite: NEGATIVE
PROTEIN: 30 mg/dL — AB
Specific Gravity, Urine: 1.03 (ref 1.005–1.030)
pH: 5 (ref 5.0–8.0)

## 2018-03-04 MED ORDER — ACETAMINOPHEN 325 MG PO TABS
650.0000 mg | ORAL_TABLET | Freq: Once | ORAL | Status: AC
  Administered 2018-03-04: 650 mg via ORAL
  Filled 2018-03-04: qty 2

## 2018-03-04 NOTE — MAU Provider Note (Signed)
Chief Complaint:  Rupture of Membranes   First Provider Initiated Contact with Patient 03/04/18 2044     HPI  HPI: Valerie [name] is a 27 y.o. [phone] at 70w2dwho presents to maternity admissions reporting leaking of fluid once before shower.   No contractions. She reports good fetal movement, denies vaginal bleeding, vaginal itching/burning, urinary symptoms, h/a, dizziness, n/v, diarrhea, constipation or fever/chills.  .  RN Note: Possible SROM-Had a gush of fluid as she was getting in the shower-doesn't feel like she had urinated.  Reports feeling some leak out since-not a lot.  Reports normal fetal movement.  No VB.  Also reports some ctx-normal for her.    Past Medical History: Past Medical History:  Diagnosis Date  . Abnormal Pap smear   . Anemia   . Anxiety   . Bell's palsy   . BV (bacterial vaginosis)   . Eczema   . First degree heart block   . Gastric ulcer    2000  . Heart murmur   . Infection    UTI, chronic sinus inf  . Migraine   . Mitral valve regurgitation   . Ovarian cyst   . Tricuspid valve regurgitation   . UTI (urinary tract infection)     Past obstetric history: OB History  Gravida Para Term Preterm AB Living  4 1 1   2 1   SAB TAB Ectopic Multiple Live Births  2       1    # Outcome Date GA Lbr Len/2nd Weight Sex Delivery Anes PTL Lv  4 Current           3 Term 07/07/13 [redacted]w[redacted]d 10:05 / 01:11 2850 g M Vag-Spont EPI  LIV  2 SAB 02/16/12 [redacted]w[redacted]d         1 SAB 12/17/03 [redacted]w[redacted]d           Past Surgical History: Past Surgical History:  Procedure Laterality Date  . ADENOIDECTOMY    . COLPOSCOPY    . CRYOTHERAPY    . LEEP    . MYRINGOTOMY WITH TUBE PLACEMENT      Family History: Family History  Problem Relation Age of Onset  . Hypertension Mother   . Diabetes Father   . Hypertension Father   . Cancer Paternal Aunt        female  . Diabetes Paternal Uncle   . Diabetes Paternal Grandmother   . Hypertension Paternal Grandmother   . Diabetes  Paternal Grandfather   . Hypertension Paternal Grandfather     Social History: Social History   Tobacco Use  . Smoking status: Former Smoker    Packs/day: 0.25    Types: Cigarettes    Last attempt to quit: 08/05/2017    Years since quitting: 0.5  . Smokeless tobacco: Never Used  Substance Use Topics  . Alcohol use: Never    Frequency: Never  . Drug use: No    Allergies:  Allergies  Allergen Reactions  . Augmentin [Amoxicillin-Pot Clavulanate] Diarrhea and Nausea And Vomiting       . Ciprofloxacin Hives  . Doxycycline Hives  . Hydrocodone Hives  . Prednisone     Had a reaction to high doses and was told not to take again  . Raspberry Swelling    Throat swelling    Meds:  Medications Prior to Admission  Medication Sig Dispense Refill Last Dose  . acetaminophen (TYLENOL) 500 MG tablet Take 2 tablets (1,000 mg total) by mouth every 6 (six) hours as needed.  30 tablet 0 Past Week at Unknown time  . cyclobenzaprine (FLEXERIL) 10 MG tablet Take 1 tablet (10 mg total) by mouth every 8 (eight) hours as needed for muscle spasms. 20 tablet 0 Past Month at Unknown time  . escitalopram (LEXAPRO) 10 MG tablet Take 1 tablet (10 mg total) by mouth daily. 30 tablet 11 02/24/2018 at Unknown time  . ferrous sulfate 325 (65 FE) MG tablet Take 1 tablet (325 mg total) by mouth 2 (two) times daily with a meal. 60 tablet 5 More than a month at Unknown time  . Menthol, Topical Analgesic, (BIOFREEZE) 4 % GEL Applied per package instructions. 1 Tube 0 02/23/2018 at Unknown time  . omeprazole (PRILOSEC) 20 MG capsule Take 1 capsule (20 mg total) by mouth 2 (two) times daily before a meal. 60 capsule 5 Unknown at Unknown time  . ondansetron (ZOFRAN) 8 MG tablet Take by mouth every 8 (eight) hours as needed for nausea or vomiting.   02/23/2018 at Unknown time  . Prenatal Vit-Fe Fumarate-FA (PRENATAL MULTIVITAMIN) TABS tablet Take 1 tablet by mouth daily at 12 noon.   02/24/2018 at Unknown time  .  promethazine (PHENERGAN) 25 MG tablet Take 1 tablet (25 mg total) by mouth every 6 (six) hours as needed for nausea or vomiting. 30 tablet 2 Past Month at Unknown time    I have reviewed patient's Past Medical Hx, Surgical Hx, Family Hx, Social Hx, medications and allergies.   ROS:  Review of Systems  Constitutional: Negative for chills and fever.  Respiratory: Negative for shortness of breath.   Gastrointestinal: Negative for abdominal pain.  Genitourinary: Positive for vaginal discharge. Negative for dysuria and vaginal bleeding.   Other systems negative  Physical Exam   Patient Vitals for the past 24 hrs:  BP Temp Pulse Resp Height Weight  03/04/18 2043 126/73 - 97 - - -  03/04/18 2021 131/72 98.6 F (37 C) 100 19 5\' 5"  (1.651 m) 89 kg   Constitutional: Well-developed, well-nourished female in no acute distress.  Cardiovascular: normal rate and rhythm Respiratory: normal effort, clear to auscultation bilaterally GI: Abd soft, non-tender, gravid appropriate for gestational age.   No rebound or guarding. MS: Extremities nontender, no edema, normal ROM Neurologic: Alert and oriented x 4.  GU: Neg CVAT.  PELVIC EXAM: Cervix pink, visually closed, without lesion, scant white creamy discharge, vaginal walls and external genitalia normal     NO POOLING   Dilation: 1.5 Exam by:: Wynelle BourgeoisMarie Cherlynn Popiel, cnm   FHT:  Baseline 140 , moderate variability, accelerations present, no decelerations Contractions: Infrequent, Irregular    Labs: --/--/O POS (10/09 [id number]) Results for orders placed or performed during the hospital encounter of 03/04/18 (from the past 24 hour(s))  Urinalysis, Routine w reflex microscopic     Status: Abnormal   Collection Time: 03/04/18  8:31 PM  Result Value Ref Range   Color, Urine AMBER (A) YELLOW   APPearance HAZY (A) CLEAR   Specific Gravity, Urine 1.030 1.005 - 1.030   pH 5.0 5.0 - 8.0   Glucose, UA NEGATIVE NEGATIVE mg/dL   Hgb urine dipstick NEGATIVE  NEGATIVE   Bilirubin Urine SMALL (A) NEGATIVE   Ketones, ur 5 (A) NEGATIVE mg/dL   Protein, ur 30 (A) NEGATIVE mg/dL   Nitrite NEGATIVE NEGATIVE   Leukocytes, UA MODERATE (A) NEGATIVE   RBC / HPF 0-5 0 - 5 RBC/hpf   WBC, UA 11-20 0 - 5 WBC/hpf   Bacteria, UA RARE (A) NONE SEEN  Squamous Epithelial / LPF 21-50 0 - 5   Mucus PRESENT     Imaging:  No results found.  MAU Course/MDM: I have ordered labs and reviewed results.  NST reviewed, reactive, category I Treatments in MAU included EFM.    Assessment: SIUP at [redacted]w[redacted]d Vaginal discharge, no evidence of ROM Reactive fetal heart rate tracing  Plan: Discharge home Labor precautions and fetal kick counts Follow up in Office for prenatal visits and recheck of status  Encouraged to return here or to other Urgent Care/ED if she develops worsening of symptoms, increase in pain, fever, or other concerning symptoms.   Pt stable at time of discharge.  Wynelle Bourgeois CNM, MSN Certified Nurse-Midwife 03/04/2018 8:44 PM

## 2018-03-04 NOTE — Discharge Instructions (Signed)

## 2018-03-04 NOTE — MAU Note (Signed)
Possible SROM-Had a gush of fluid as she was getting in the shower-doesn't feel like she had urinated.  Reports feeling some leak out since-not a lot.  Reports normal fetal movement.  No VB.  Also reports some ctx-normal for her.

## 2018-03-08 ENCOUNTER — Inpatient Hospital Stay (HOSPITAL_COMMUNITY)
Admission: AD | Admit: 2018-03-08 | Discharge: 2018-03-08 | Disposition: A | Discharge status: Home / Self Care | Payer: Medicaid Other | Source: Ambulatory Visit | Attending: Neurology | Admitting: Neurology

## 2018-03-08 ENCOUNTER — Encounter (HOSPITAL_COMMUNITY): Payer: Self-pay

## 2018-03-08 DIAGNOSIS — O368130 Decreased fetal movements, third trimester, not applicable or unspecified: Secondary | ICD-10-CM | POA: Insufficient documentation

## 2018-03-08 DIAGNOSIS — O99343 Other mental disorders complicating pregnancy, third trimester: Secondary | ICD-10-CM | POA: Insufficient documentation

## 2018-03-08 DIAGNOSIS — F329 Major depressive disorder, single episode, unspecified: Secondary | ICD-10-CM | POA: Insufficient documentation

## 2018-03-08 DIAGNOSIS — Z3A35 35 weeks gestation of pregnancy: Secondary | ICD-10-CM | POA: Diagnosis not present

## 2018-03-08 DIAGNOSIS — Z87891 Personal history of nicotine dependence: Secondary | ICD-10-CM | POA: Diagnosis not present

## 2018-03-08 NOTE — MAU Provider Note (Signed)
Chief Complaint  Patient presents with  . Decreased Fetal Movement     First Provider Initiated Contact with Patient 03/08/18 1522      S: Valerie [name]  is a 27 y.o. y.o. year old [id number] female at 6744w6d weeks gestation who presents to MAU reporting decreased fetal movement since 1145. Feeling fetal mvmt since arrival to MAU.   Contractions: Denies Vaginal bleeding: Denies Leaking of fluid: Denies  Patient Active Problem List   Diagnosis Date Noted  . Hereditary disease in family possibly affecting fetus, antepartum 11/10/2017  . Fetal abnormality affecting management of mother, antepartum 11/10/2017  . Palpitations 10/16/2017  . Cardiac murmur 10/16/2017  . First degree AV block 10/16/2017  . Supervision of high risk pregnancy, antepartum, second trimester 10/06/2017  . Heart block 10/06/2017  . Unwanted fertility 10/06/2017  . Skin tag of perianal region 12/14/2013  . Depressive disorder, not elsewhere classified 12/14/2013  . Former smoker, stopped smoking in distant past 11/27/2012  . Family history of diabetes in pregnancy 11/27/2012  . History of facial weakness/Neuro workup/Lesion on MRI w/out change 11/27/2012    O:  Patient Vitals for the past 24 hrs:  BP Temp Temp src Pulse Resp Height Weight  03/08/18 1458 122/75 98.1 F (36.7 C) Oral (!) 101 18 5\' 5"  (1.651 m) 89 kg   General: NAD Heart: Regular rate Lungs: Normal rate and effort Abd: Soft, NT, Gravid, S=D Pelvic: NEFG, no blood.     NST performed EFM: 155, reactive Toco: rare, painless  A: 4844w6d week IUP Decreased fetal movement resolved with reactive NST.  P: Discharge home in stable condition. Preterm Labor precautions and fetal kick counts. Follow-up as scheduled for prenatal visit or sooner as needed if symptoms worsen. Return to maternity admissions as needed if symptoms worsen.  Katrinka BlazingSmith, [place], [place] 03/08/2018 3:51 PM  2

## 2018-03-08 NOTE — MAU Note (Signed)
Has felt some movements but last big movement was at 1145. Feels less then normal and smaller  No LOF, no bleeding, no ctx

## 2018-03-08 NOTE — Discharge Instructions (Signed)

## 2018-03-09 ENCOUNTER — Encounter: Payer: Medicaid Other | Admitting: Obstetrics

## 2018-03-16 ENCOUNTER — Encounter: Payer: Self-pay | Admitting: Obstetrics

## 2018-03-16 ENCOUNTER — Other Ambulatory Visit (HOSPITAL_COMMUNITY)
Admission: RE | Admit: 2018-03-16 | Discharge: 2018-03-16 | Disposition: A | Discharge status: Home / Self Care | Payer: Medicaid Other | Source: Ambulatory Visit | Attending: Obstetrics | Admitting: Obstetrics

## 2018-03-16 ENCOUNTER — Ambulatory Visit (INDEPENDENT_AMBULATORY_CARE_PROVIDER_SITE_OTHER): Payer: Medicaid Other | Admitting: Obstetrics

## 2018-03-16 VITALS — BP 113/79 | HR 91 | Wt 195.8 lb

## 2018-03-16 DIAGNOSIS — O0992 Supervision of high risk pregnancy, unspecified, second trimester: Secondary | ICD-10-CM | POA: Insufficient documentation

## 2018-03-16 DIAGNOSIS — O359XX0 Maternal care for (suspected) fetal abnormality and damage, unspecified, not applicable or unspecified: Secondary | ICD-10-CM

## 2018-03-16 DIAGNOSIS — O0990 Supervision of high risk pregnancy, unspecified, unspecified trimester: Secondary | ICD-10-CM

## 2018-03-16 DIAGNOSIS — O0993 Supervision of high risk pregnancy, unspecified, third trimester: Secondary | ICD-10-CM

## 2018-03-16 DIAGNOSIS — I440 Atrioventricular block, first degree: Secondary | ICD-10-CM

## 2018-03-16 LAB — OB RESULTS CONSOLE GC/CHLAMYDIA: Gonorrhea: NEGATIVE

## 2018-03-16 NOTE — Progress Notes (Signed)
Pt presents for ROB. Pt has no other concerns. 

## 2018-03-16 NOTE — Progress Notes (Signed)
Subjective:  Valerie [name] is [name] 27 y.o. [phone] at 767w0d being seen today for ongoing prenatal care.  She is currently monitored for the following issues for this high-risk pregnancy and has Former smoker, stopped smoking in distant past; Family history of diabetes in pregnancy; History of facial weakness/Neuro workup/Lesion on MRI w/out change; Skin tag of perianal region; Depressive disorder, not elsewhere classified; Supervision of high risk pregnancy, antepartum, second trimester; Heart block; Unwanted fertility; Palpitations; Cardiac murmur; First degree AV block; Hereditary disease in family possibly affecting fetus, antepartum; and Fetal abnormality affecting management of mother, antepartum on their problem list.  Patient reports no complaints.  Contractions: Irregular. Vag. Bleeding: None.  Movement: Present. Denies leaking of fluid.   The following portions of the patient's history were reviewed and updated as appropriate: allergies, current medications, past family history, past medical history, past social history, past surgical history and problem list. Problem list updated.  Objective:   Vitals:   03/16/18 1601  BP: 113/79  Pulse: 91  Weight: 195 lb 12.8 oz (88.8 kg)    Fetal Status:     Movement: Present     General:  Alert, oriented and cooperative. Patient is in no acute distress.  Skin: Skin is warm and dry. No rash noted.   Cardiovascular: Normal heart rate noted  Respiratory: Normal respiratory effort, no problems with respiration noted  Abdomen: Soft, gravid, appropriate for gestational age. Pain/Pressure: Present     Pelvic:  Cervical exam deferred        Extremities: Normal range of motion.  Edema: Trace  Mental Status: Normal mood and affect. Normal behavior. Normal judgment and thought content.   Urinalysis:      Assessment and Plan:  Pregnancy: [id number] at 8267w0d  1. Supervision of high risk pregnancy, antepartum, second trimester Rx: - Strep Gp B NAA -  Cervicovaginal ancillary only( [facility])  Term labor symptoms and general obstetric precautions including but not limited to vaginal bleeding, contractions, leaking of fluid and fetal movement were reviewed in detail with the patient. Please refer to After Visit Summary for other counseling recommendations.  Return in about 1 week (around 03/23/2018) for ROB.   Valerie [name] Valerie [name], Valerie Bitting A, MD

## 2018-03-17 ENCOUNTER — Encounter: Payer: Self-pay | Admitting: Obstetrics

## 2018-03-18 LAB — STREP GP B NAA: Strep Gp B NAA: NEGATIVE

## 2018-03-18 NOTE — L&D Delivery Note (Signed)
Patient: Valerie [name] MRN: [id number]  GBS status: negative  Patient is a 28 y.o. now [id number] s/p NSVD at [redacted]w[redacted]d, who was admitted for active labor. SROM 11h 95m prior to delivery with clear fluid.    Delivery Note At 9:13 AM a viable female was delivered via  (Presentation: LOA ).  APGAR: 9,9 ; weight pending  .   Placenta status: intact, spontaneous.  Cord: 3 vessel    Anesthesia: Epidural  Episiotomy: None  Lacerations: periurethral hemostatic  Suture Repair: n/a Est. Blood Loss (mL):  33ml  Mom to postpartum.  Baby to Couplet care / Skin to Skin.  [name] 04/01/2018, 9:41 AM   Head delivered LOA. No nuchal cord present. compound presentation noted. Shoulder and body delivered in usual fashion. Infant with spontaneous cry, placed on mother's abdomen, dried and bulb suctioned. Cord clamped x 2 after 1-minute delay, and cut by family member. Cord blood drawn. Placenta delivered spontaneously with gentle cord traction. Fundus firm with massage and Pitocin. Perineum inspected and found to be intact.

## 2018-03-20 LAB — CERVICOVAGINAL ANCILLARY ONLY
BACTERIAL VAGINITIS: NEGATIVE
Candida vaginitis: POSITIVE — AB
Chlamydia: NEGATIVE
Neisseria Gonorrhea: NEGATIVE
Trichomonas: NEGATIVE

## 2018-03-22 ENCOUNTER — Inpatient Hospital Stay (HOSPITAL_COMMUNITY)
Admission: AD | Admit: 2018-03-22 | Discharge: 2018-03-22 | Disposition: A | Discharge status: Home / Self Care | Payer: Medicaid Other | Source: Ambulatory Visit | Attending: Obstetrics and Gynecology | Admitting: Obstetrics and Gynecology

## 2018-03-22 ENCOUNTER — Encounter (HOSPITAL_COMMUNITY): Payer: Self-pay | Admitting: *Deleted

## 2018-03-22 ENCOUNTER — Other Ambulatory Visit: Payer: Self-pay | Admitting: Obstetrics

## 2018-03-22 DIAGNOSIS — O479 False labor, unspecified: Secondary | ICD-10-CM | POA: Diagnosis not present

## 2018-03-22 DIAGNOSIS — O471 False labor at or after 37 completed weeks of gestation: Secondary | ICD-10-CM | POA: Insufficient documentation

## 2018-03-22 DIAGNOSIS — B373 Candidiasis of vulva and vagina: Secondary | ICD-10-CM

## 2018-03-22 DIAGNOSIS — Z3A37 37 weeks gestation of pregnancy: Secondary | ICD-10-CM | POA: Insufficient documentation

## 2018-03-22 DIAGNOSIS — B3731 Acute candidiasis of vulva and vagina: Secondary | ICD-10-CM

## 2018-03-22 MED ORDER — TERCONAZOLE 0.4 % VA CREA: 1.0000 | TOPICAL_CREAM | Freq: Every day | VAGINAL | 0 refills | Status: DC

## 2018-03-22 NOTE — MAU Provider Note (Signed)
S: Ms. Valerie [name] is a 28 y.o. [phone] at [redacted]w[redacted]d  who presents to [facility]U today complaining contractions q 8 minutes since 1800. She denies vaginal bleeding. She denies LOF. She reports normal fetal movement.  She reports losing her mucous plug and the RN on the phone told her to come in  O: BP 107/61 (BP Location: Right Arm)   Pulse 72   Temp 98.4 F (36.9 C) (Oral)   Resp 16   Ht 5\' 5"  (1.651 m)   Wt 89.4 kg   LMP 06/30/2017   BMI 32.78 kg/m  GENERAL: Well-developed, well-nourished female in no acute distress.  HEAD: Normocephalic, atraumatic.  CHEST: Normal effort of breathing, regular heart rate ABDOMEN: Soft, nontender, gravid  Cervical exam:  Dilation: 2 Effacement (%): 70 Cervical Position: Posterior Station: -2 Presentation: Vertex Exam by:: K. Marijo File   Fetal Monitoring: Baseline: 130 Variability: moderate Accelerations: 15x15 Decelerations: none Contractions: none   A: [facility]UP at [redacted]w[redacted]d  False labor  P: Discharge home Reassurance of losing mucous plug provided Labor precautions reviewed Patient can return to MAU as needed  Rolm Bookbinder, [place] 03/22/2018 11:09 PM

## 2018-03-22 NOTE — Discharge Instructions (Signed)
Braxton Hicks Contractions °Contractions of the uterus can occur throughout pregnancy, but they are not always a sign that you are in labor. You may have practice contractions called Braxton Hicks contractions. These false labor contractions are sometimes confused with true labor. °What are Braxton Hicks contractions? °Braxton Hicks contractions are tightening movements that occur in the muscles of the uterus before labor. Unlike true labor contractions, these contractions do not result in opening (dilation) and thinning of the cervix. Toward the end of pregnancy (32-34 weeks), Braxton Hicks contractions can happen more often and may become stronger. These contractions are sometimes difficult to tell apart from true labor because they can be very uncomfortable. You should not feel embarrassed if you go to the hospital with false labor. °Sometimes, the only way to tell if you are in true labor is for your health care provider to look for changes in the cervix. The health care provider will do a physical exam and may monitor your contractions. If you are not in true labor, the exam should show that your cervix is not dilating and your water has not broken. °If there are no other health problems associated with your pregnancy, it is completely safe for you to be sent home with false labor. You may continue to have Braxton Hicks contractions until you go into true labor. °How to tell the difference between true labor and false labor °True labor °· Contractions last 30-70 seconds. °· Contractions become very regular. °· Discomfort is usually felt in the top of the uterus, and it spreads to the lower abdomen and low back. °· Contractions do not go away with walking. °· Contractions usually become more intense and increase in frequency. °· The cervix dilates and gets thinner. °False labor °· Contractions are usually shorter and not as strong as true labor contractions. °· Contractions are usually irregular. °· Contractions  are often felt in the front of the lower abdomen and in the groin. °· Contractions may go away when you walk around or change positions while lying down. °· Contractions get weaker and are shorter-lasting as time goes on. °· The cervix usually does not dilate or become thin. °Follow these instructions at home: ° °· Take over-the-counter and prescription medicines only as told by your health care provider. °· Keep up with your usual exercises and follow other instructions from your health care provider. °· Eat and drink lightly if you think you are going into labor. °· If Braxton Hicks contractions are making you uncomfortable: °? Change your position from lying down or resting to walking, or change from walking to resting. °? Sit and rest in a tub of warm water. °? Drink enough fluid to keep your urine pale yellow. Dehydration may cause these contractions. °? Do slow and deep breathing several times an hour. °· Keep all follow-up prenatal visits as told by your health care provider. This is important. °Contact a health care provider if: °· You have a fever. °· You have continuous pain in your abdomen. °Get help right away if: °· Your contractions become stronger, more regular, and closer together. °· You have fluid leaking or gushing from your vagina. °· You pass blood-tinged mucus (bloody show). °· You have bleeding from your vagina. °· You have low back pain that you never had before. °· You feel your baby’s head pushing down and causing pelvic pressure. °· Your baby is not moving inside you as much as it used to. °Summary °· Contractions that occur before labor are   called Braxton Hicks contractions, false labor, or practice contractions. °· Braxton Hicks contractions are usually shorter, weaker, farther apart, and less regular than true labor contractions. True labor contractions usually become progressively stronger and regular, and they become more frequent. °· Manage discomfort from Braxton Hicks contractions  by changing position, resting in a warm bath, drinking plenty of water, or practicing deep breathing. °This information is not intended to replace advice given to you by your health care provider. Make sure you discuss any questions you have with your health care provider. °Document Released: 07/18/2016 Document Revised: 12/17/2016 Document Reviewed: 07/18/2016 °Elsevier Interactive Patient Education © 2019 Elsevier Inc. ° °Safe Medications in Pregnancy  ° °Acne: °Benzoyl Peroxide °Salicylic Acid ° °Backache/Headache: °Tylenol: 2 regular strength every 4 hours OR °             2 Extra strength every 6 hours ° °Colds/Coughs/Allergies: °Benadryl (alcohol free) 25 mg every 6 hours as needed °Breath right strips °Claritin °Cepacol throat lozenges °Chloraseptic throat spray °Cold-Eeze- up to three times per day °Cough drops, alcohol free °Flonase (by prescription only) °Guaifenesin °Mucinex °Robitussin DM (plain only, alcohol free) °Saline nasal spray/drops °Sudafed (pseudoephedrine) & Actifed ** use only after [redacted] weeks gestation and if you do not have high blood pressure °Tylenol °Vicks Vaporub °Zinc lozenges °Zyrtec  ° °Constipation: °Colace °Ducolax suppositories °Fleet enema °Glycerin suppositories °Metamucil °Milk of magnesia °Miralax °Senokot °Smooth move tea ° °Diarrhea: °Kaopectate °Imodium A-D ° °*NO pepto Bismol ° °Hemorrhoids: °Anusol °Anusol HC °Preparation H °Tucks ° °Indigestion: °Tums °Maalox °Mylanta °Zantac  °Pepcid ° °Insomnia: °Benadryl (alcohol free) 25mg every 6 hours as needed °Tylenol PM °Unisom, no Gelcaps ° °Leg Cramps: °Tums °MagGel ° °Nausea/Vomiting:  °Bonine °Dramamine °Emetrol °Ginger extract °Sea bands °Meclizine  °Nausea medication to take during pregnancy:  °Unisom (doxylamine succinate 25 mg tablets) Take one tablet daily at bedtime. If symptoms are not adequately controlled, the dose can be increased to a maximum recommended dose of two tablets daily (1/2 tablet in the morning, 1/2  tablet mid-afternoon and one at bedtime). °Vitamin B6 100mg tablets. Take one tablet twice a day (up to 200 mg per day). ° °Skin Rashes: °Aveeno products °Benadryl cream or 25mg every 6 hours as needed °Calamine Lotion °1% cortisone cream ° °Yeast infection: °Gyne-lotrimin 7 °Monistat 7 ° ° °**If taking multiple medications, please check labels to avoid duplicating the same active ingredients °**take medication as directed on the label °** Do not exceed 4000 mg of tylenol in 24 hours °**Do not take medications that contain aspirin or ibuprofen ° ° ° ° °

## 2018-03-25 ENCOUNTER — Encounter: Payer: Medicaid Other | Admitting: Obstetrics

## 2018-03-25 ENCOUNTER — Inpatient Hospital Stay (HOSPITAL_COMMUNITY)
Admission: AD | Admit: 2018-03-25 | Discharge: 2018-03-25 | Disposition: A | Discharge status: Home / Self Care | Payer: Medicaid Other | Attending: Obstetrics and Gynecology | Admitting: Obstetrics and Gynecology

## 2018-03-25 ENCOUNTER — Encounter (HOSPITAL_COMMUNITY): Payer: Self-pay

## 2018-03-25 ENCOUNTER — Other Ambulatory Visit: Payer: Self-pay

## 2018-03-25 DIAGNOSIS — G43909 Migraine, unspecified, not intractable, without status migrainosus: Secondary | ICD-10-CM | POA: Insufficient documentation

## 2018-03-25 DIAGNOSIS — Z880 Allergy status to penicillin: Secondary | ICD-10-CM | POA: Insufficient documentation

## 2018-03-25 DIAGNOSIS — O26893 Other specified pregnancy related conditions, third trimester: Secondary | ICD-10-CM

## 2018-03-25 DIAGNOSIS — G43009 Migraine without aura, not intractable, without status migrainosus: Secondary | ICD-10-CM

## 2018-03-25 DIAGNOSIS — Z3A38 38 weeks gestation of pregnancy: Secondary | ICD-10-CM | POA: Insufficient documentation

## 2018-03-25 DIAGNOSIS — O99353 Diseases of the nervous system complicating pregnancy, third trimester: Secondary | ICD-10-CM | POA: Insufficient documentation

## 2018-03-25 DIAGNOSIS — Z87891 Personal history of nicotine dependence: Secondary | ICD-10-CM | POA: Diagnosis not present

## 2018-03-25 MED ORDER — METOCLOPRAMIDE HCL 5 MG/ML IJ SOLN
10.0000 mg | Freq: Once | INTRAMUSCULAR | Status: AC
  Administered 2018-03-25: 10 mg via INTRAVENOUS
  Filled 2018-03-25: qty 2

## 2018-03-25 MED ORDER — SODIUM CHLORIDE 0.9 % IV SOLN
INTRAVENOUS | Status: DC
  Administered 2018-03-25: 19:00:00 via INTRAVENOUS

## 2018-03-25 MED ORDER — METOCLOPRAMIDE HCL 10 MG PO TABS: 10.0000 mg | ORAL_TABLET | Freq: Three times a day (TID) | ORAL | 0 refills | Status: DC | PRN

## 2018-03-25 MED ORDER — DIPHENHYDRAMINE HCL 50 MG/ML IJ SOLN
25.0000 mg | Freq: Once | INTRAMUSCULAR | Status: AC
  Administered 2018-03-25: 25 mg via INTRAVENOUS
  Filled 2018-03-25: qty 1

## 2018-03-25 MED ORDER — DEXAMETHASONE SODIUM PHOSPHATE 10 MG/ML IJ SOLN
10.0000 mg | Freq: Once | INTRAMUSCULAR | Status: AC
  Administered 2018-03-25: 10 mg via INTRAVENOUS
  Filled 2018-03-25: qty 1

## 2018-03-25 NOTE — MAU Note (Signed)
Urine in lab 

## 2018-03-25 NOTE — MAU Note (Signed)
Has a migraine, started yesterday.  Is light sensitive. Has  Had some irreg/sporadic ctxs, no bleeding or leaking.  Reports good movement.

## 2018-03-25 NOTE — Discharge Instructions (Signed)
Migraine Headache A migraine headache is an intense, throbbing pain on one side or both sides of the head. Migraines may also cause other symptoms, such as nausea, vomiting, and sensitivity to light and noise. What are the causes? Doing or taking certain things may also trigger migraines, such as:  Alcohol.  Smoking.  Medicines, such as: ? Medicine used to treat chest pain (nitroglycerine). ? Birth control pills. ? Estrogen pills. ? Certain blood pressure medicines.  Aged cheeses, chocolate, or caffeine.  Foods or drinks that contain nitrates, glutamate, aspartame, or tyramine.  Physical activity. Other things that may trigger a migraine include:  Menstruation.  Pregnancy.  Hunger.  Stress, lack of sleep, too much sleep, or fatigue.  Weather changes. What increases the risk? The following factors may make you more likely to experience migraine headaches:  Age. Risk increases with age.  Family history of migraine headaches.  Being Caucasian.  Depression and anxiety.  Obesity.  Being a woman.  Having a hole in the heart (patent foramen ovale) or other heart problems. What are the signs or symptoms? The main symptom of this condition is pulsating or throbbing pain. Pain may:  Happen in any area of the head, such as on one side or both sides.  Interfere with daily activities.  Get worse with physical activity.  Get worse with exposure to bright lights or loud noises. Other symptoms may include:  Nausea.  Vomiting.  Dizziness.  General sensitivity to bright lights, loud noises, or smells. Before you get a migraine, you may get warning signs that a migraine is developing (aura). An aura may include:  Seeing flashing lights or having blind spots.  Seeing bright spots, halos, or zigzag lines.  Having tunnel vision or blurred vision.  Having numbness or a tingling feeling.  Having trouble talking.  Having muscle weakness. How is this diagnosed? A  migraine headache can be diagnosed based on:  Your symptoms.  A physical exam.  Tests, such as CT scan or MRI of the head. These imaging tests can help rule out other causes of headaches.  Taking fluid from the spine (lumbar puncture) and analyzing it (cerebrospinal fluid analysis, or CSF analysis). How is this treated? A migraine headache is usually treated with medicines that:  Relieve pain.  Relieve nausea.  Prevent migraines from coming back. Treatment may also include:  Acupuncture.  Lifestyle changes like avoiding foods that trigger migraines. Follow these instructions at home: Medicines  Take over-the-counter and prescription medicines only as told by your health care provider.  Do not drive or use heavy machinery while taking prescription pain medicine.  To prevent or treat constipation while you are taking prescription pain medicine, your health care provider may recommend that you: ? Drink enough fluid to keep your urine clear or pale yellow. ? Take over-the-counter or prescription medicines. ? Eat foods that are high in fiber, such as fresh fruits and vegetables, whole grains, and beans. ? Limit foods that are high in fat and processed sugars, such as fried and sweet foods. Lifestyle  Avoid alcohol use.  Do not use any products that contain nicotine or tobacco, such as cigarettes and e-cigarettes. If you need help quitting, ask your health care provider.  Get at least 8 hours of sleep every night.  Limit your stress. General instructions      Keep a journal to find out what may trigger your migraine headaches. For example, write down: ? What you eat and drink. ? How much sleep you  get. ? Any change to your diet or medicines.  If you have a migraine: ? Avoid things that make your symptoms worse, such as bright lights. ? It may help to lie down in a dark, quiet room. ? Do not drive or use heavy machinery. ? Ask your health care provider what  activities are safe for you while you are experiencing symptoms.  Keep all follow-up visits as told by your health care provider. This is important. Contact a health care provider if:  You develop symptoms that are different or more severe than your usual migraine symptoms. Get help right away if:  Your migraine becomes severe.  You have a fever.  You have a stiff neck.  You have vision loss.  Your muscles feel weak or like you cannot control them.  You start to lose your balance often.  You develop trouble walking.  You faint. This information is not intended to replace advice given to you by your health care provider. Make sure you discuss any questions you have with your health care provider. Document Released: 03/04/2005 Document Revised: 09/22/2015 Document Reviewed: 08/21/2015 Elsevier Interactive Patient Education  2019 Elsevier Inc.  Ball CorporationBraxton Hicks Contractions Contractions of the uterus can occur throughout pregnancy, but they are not always a sign that you are in labor. You may have practice contractions called Braxton Hicks contractions. These false labor contractions are sometimes confused with true labor. What are Deberah PeltonBraxton Hicks contractions? Braxton Hicks contractions are tightening movements that occur in the muscles of the uterus before labor. Unlike true labor contractions, these contractions do not result in opening (dilation) and thinning of the cervix. Toward the end of pregnancy (32-34 weeks), Braxton Hicks contractions can happen more often and may become stronger. These contractions are sometimes difficult to tell apart from true labor because they can be very uncomfortable. You should not feel embarrassed if you go to the hospital with false labor. Sometimes, the only way to tell if you are in true labor is for your health care provider to look for changes in the cervix. The health care provider will do a physical exam and may monitor your contractions. If you  are not in true labor, the exam should show that your cervix is not dilating and your water has not broken. If there are no other health problems associated with your pregnancy, it is completely safe for you to be sent home with false labor. You may continue to have Braxton Hicks contractions until you go into true labor. How to tell the difference between true labor and false labor True labor  Contractions last 30-70 seconds.  Contractions become very regular.  Discomfort is usually felt in the top of the uterus, and it spreads to the lower abdomen and low back.  Contractions do not go away with walking.  Contractions usually become more intense and increase in frequency.  The cervix dilates and gets thinner. False labor  Contractions are usually shorter and not as strong as true labor contractions.  Contractions are usually irregular.  Contractions are often felt in the front of the lower abdomen and in the groin.  Contractions may go away when you walk around or change positions while lying down.  Contractions get weaker and are shorter-lasting as time goes on.  The cervix usually does not dilate or become thin. Follow these instructions at home:   Take over-the-counter and prescription medicines only as told by your health care provider.  Keep up with your usual exercises and  follow other instructions from your health care provider.  Eat and drink lightly if you think you are going into labor.  If Braxton Hicks contractions are making you uncomfortable: ? Change your position from lying down or resting to walking, or change from walking to resting. ? Sit and rest in a tub of warm water. ? Drink enough fluid to keep your urine pale yellow. Dehydration may cause these contractions. ? Do slow and deep breathing several times an hour.  Keep all follow-up prenatal visits as told by your health care provider. This is important. Contact a health care provider if:  You have  a fever.  You have continuous pain in your abdomen. Get help right away if:  Your contractions become stronger, more regular, and closer together.  You have fluid leaking or gushing from your vagina.  You pass blood-tinged mucus (bloody show).  You have bleeding from your vagina.  You have low back pain that you never had before.  You feel your babys head pushing down and causing pelvic pressure.  Your baby is not moving inside you as much as it used to. Summary  Contractions that occur before labor are called Braxton Hicks contractions, false labor, or practice contractions.  Braxton Hicks contractions are usually shorter, weaker, farther apart, and less regular than true labor contractions. True labor contractions usually become progressively stronger and regular, and they become more frequent.  Manage discomfort from [facility] Hospital[facility] Hicks contractions by changing position, resting in a warm bath, drinking plenty of water, or practicing deep breathing. This information is not intended to replace advice given to you by your health care provider. Make sure you discuss any questions you have with your health care provider. Document Released: 07/18/2016 Document Revised: 12/17/2016 Document Reviewed: 07/18/2016 Elsevier Interactive Patient Education  2019 Elsevier Inc.  Fetal Movement Counts Patient Name: ________________________________________________ Patient Due Date: ____________________ What is a fetal movement count?  A fetal movement count is the number of times that you feel your baby move during a certain amount of time. This may also be called a fetal kick count. A fetal movement count is recommended for every pregnant woman. You may be asked to start counting fetal movements as early as week 28 of your pregnancy. Pay attention to when your baby is most active. You may notice your baby's sleep and wake cycles. You may also notice things that make your baby move more. You should do  a fetal movement count:  When your baby is normally most active.  At the same time each day. A good time to count movements is while you are resting, after having something to eat and drink. How do I count fetal movements? 1. Find a quiet, comfortable area. Sit, or lie down on your side. 2. Write down the date, the start time and stop time, and the number of movements that you felt between those two times. Take this information with you to your health care visits. 3. For 2 hours, count kicks, flutters, swishes, rolls, and jabs. You should feel at least 10 movements during 2 hours. 4. You may stop counting after you have felt 10 movements. 5. If you do not feel 10 movements in 2 hours, have something to eat and drink. Then, keep resting and counting for 1 hour. If you feel at least 4 movements during that hour, you may stop counting. Contact a health care provider if:  You feel fewer than 4 movements in 2 hours.  Your baby is not  moving like he or she usually does. Date: ____________ Start time: ____________ Stop time: ____________ Movements: ____________ Date: ____________ Start time: ____________ Stop time: ____________ Movements: ____________ Date: ____________ Start time: ____________ Stop time: ____________ Movements: ____________ Date: ____________ Start time: ____________ Stop time: ____________ Movements: ____________ Date: ____________ Start time: ____________ Stop time: ____________ Movements: ____________ Date: ____________ Start time: ____________ Stop time: ____________ Movements: ____________ Date: ____________ Start time: ____________ Stop time: ____________ Movements: ____________ Date: ____________ Start time: ____________ Stop time: ____________ Movements: ____________ Date: ____________ Start time: ____________ Stop time: ____________ Movements: ____________ This information is not intended to replace advice given to you by your health care provider. Make sure you discuss  any questions you have with your health care provider. Document Released: 04/03/2006 Document Revised: 11/01/2015 Document Reviewed: 04/13/2015 Elsevier Interactive Patient Education  2019 [employer].

## 2018-03-25 NOTE — MAU Provider Note (Signed)
History    Chief Complaint  Patient presents with  . Headache   Valerie [name] is a [id number] 64 YOF at 110w2d who presents for migraine HA since yesterday afternoon. She has a long hx of migraines before and during pregnancy and feels that this HA is consistent with previous migraines. She describes the HA as pounding with tension. Reports light sensitivity and floaters, which is typical for her migraines. No other visual changes. Reports nausea. She has taken Tylenol and Zofran with no relief. She denies dizziness, CP, SOB, abdominal pain, changes in LE edema. Reports good fetal movement unchanged irregular contractions, and no vaginal bleeding/discarge. She has been previously seen in the MAU for migraine and a headache cocktail was effective.    OB History    Gravida  4   Para  1   Term  1   Preterm      AB  2   Living  1     SAB  2   TAB      Ectopic      Multiple      Live Births  1           Past Medical History:  Diagnosis Date  . Abnormal Pap smear   . Anemia   . Anxiety   . 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 BV (bacterial vaginosis)   . Eczema   . First degree heart block   . Gastric ulcer    2000  . Heart murmur   . Infection    UTI, chronic sinus inf  . Migraine   . Mitral valve regurgitation   . Ovarian cyst   . Tricuspid valve regurgitation   . UTI (urinary tract infection)     Past Surgical History:  Procedure Laterality Date  . ADENOIDECTOMY    . COLPOSCOPY    . CRYOTHERAPY    . LEEP    . MYRINGOTOMY WITH TUBE PLACEMENT      Family History  Problem Relation Age of Onset  . Hypertension Mother   . Diabetes Father   . Hypertension Father   . Cancer Paternal Aunt        female  . Diabetes Paternal Uncle   . Diabetes Paternal Grandmother   . Hypertension Paternal Grandmother   . Diabetes Paternal Grandfather   . Hypertension Paternal Grandfather     Social History   Tobacco Use  . Smoking status:  Former Smoker    Packs/day: 0.25    Types: Cigarettes    Last attempt to quit: 08/05/2017    Years since quitting: 0.6  . Smokeless tobacco: Never Used  Substance Use Topics  . Alcohol use: Never    Frequency: Never  . Drug use: No    Allergies:  Allergies  Allergen Reactions  . Augmentin [Amoxicillin-Pot Clavulanate] Diarrhea and Nausea And Vomiting       . Ciprofloxacin Hives  . Doxycycline Hives  . Hydrocodone Hives  . Prednisone     Had a reaction to high doses and was told not to take again  . Raspberry Swelling    Throat swelling    Medications Prior to Admission  Medication Sig Dispense Refill Last Dose  . acetaminophen (TYLENOL) 500 MG tablet Take 2 tablets (1,000 mg total) by mouth every 6 (six) hours as needed. 30 tablet 0 Taking  . cyclobenzaprine (FLEXERIL) 10 MG tablet Take 1 tablet (10 mg total) by mouth every 8 (  eight) hours as needed for muscle spasms. 20 tablet 0 Taking  . escitalopram (LEXAPRO) 10 MG tablet Take 1 tablet (10 mg total) by mouth daily. 30 tablet 11 Taking  . ferrous sulfate 325 (65 FE) MG tablet Take 1 tablet (325 mg total) by mouth 2 (two) times daily with a meal. 60 tablet 5 Taking  . Menthol, Topical Analgesic, (BIOFREEZE) 4 % GEL Applied per package instructions. 1 Tube 0 Taking  . omeprazole (PRILOSEC) 20 MG capsule Take 1 capsule (20 mg total) by mouth 2 (two) times daily before a meal. 60 capsule 5 Taking  . ondansetron (ZOFRAN) 8 MG tablet Take by mouth every 8 (eight) hours as needed for nausea or vomiting.   Taking  . Prenatal Vit-Fe Fumarate-FA (PRENATAL MULTIVITAMIN) TABS tablet Take 1 tablet by mouth daily at 12 noon.   Taking  . promethazine (PHENERGAN) 25 MG tablet Take 1 tablet (25 mg total) by mouth every 6 (six) hours as needed for nausea or vomiting. 30 tablet 2 Taking  . terconazole (TERAZOL 7) 0.4 % vaginal cream Place 1 applicator vaginally at bedtime. 45 g 0     Review of Systems  Constitutional: Negative for fever.   HENT: Negative.   Eyes: Positive for photophobia. Negative for blurred vision and double vision.       Floaters  Respiratory: Negative.   Cardiovascular: Negative.   Gastrointestinal: Positive for nausea. Negative for abdominal pain, constipation and diarrhea.  Genitourinary: Negative.   Skin: Negative.   Neurological: Positive for headaches. Negative for dizziness, sensory change, speech change, focal weakness and weakness.   Physical Exam Blood pressure 117/78, pulse 100, temperature 98.1 F (36.7 C), temperature source Oral, resp. rate 17, weight 88.9 kg, last menstrual period 06/30/2017, SpO2 99 %. Physical Exam  Constitutional: She is oriented to person, place, and time. She appears well-nourished.  Cardiovascular: Normal rate and regular rhythm.  Systolic murmur, known hx   Respiratory: Effort normal and breath sounds normal. No respiratory distress. She has no wheezes. She has no rales.  GI: There is no abdominal tenderness. There is no guarding.  Musculoskeletal:        General: No edema.  Neurological: She is oriented to person, place, and time. She exhibits normal muscle tone. Coordination normal.  Skin: Skin is warm and dry.  Psychiatric: She has a normal mood and affect.    MAU Course Procedures  MDM - HA cocktail (Decadron, Benadryl, Reglan, LR bolus).   Assessment and Plan: - Pt reports significant improvement in her migraine after HA cocktail. Discharged home.

## 2018-03-25 NOTE — MAU Provider Note (Signed)
Chief Complaint:  Headache   First Provider Initiated Contact with Patient 03/25/18 1821     HPI: Valerie [name] is a 28 y.o. [id number] at 829w2d who presents to maternity admissions reporting headache. Patient with history of migraines & states this feels like a migraine. Headache started yesterday. Frontal throbbing pain that is worse with lights. Some blurred vision that is typical of her migraines. Took tylenol today without relief. Previously received IV headache cocktail with good response.  Denies fever/chills, epigastric pain, n/v, vaginal bleeding, or LOF. Some irregular contractions. [facility]'t [facility] d/t her headache.  Normal fetal movement.   Location: head Quality: pounding Severity: 8/10 in pain scale Duration: 1 day Timing: constant Modifying factors: worse with lights. Not improved with tylenol Associated signs and symptoms: none  Past Medical History:  Diagnosis Date  . Abnormal Pap smear   . Anemia   . Anxiety   . 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 BV (bacterial vaginosis)   . Eczema   . First degree heart block   . Gastric ulcer    2000  . Heart murmur   . Infection    UTI, chronic sinus inf  . Migraine   . Mitral valve regurgitation   . Ovarian cyst   . Tricuspid valve regurgitation   . UTI (urinary tract infection)    OB History  Gravida Para Term Preterm AB Living  4 1 1   2 1   SAB TAB Ectopic Multiple Live Births  2       1    # Outcome Date GA Lbr Len/2nd Weight Sex Delivery Anes PTL Lv  4 Current           3 Term 07/07/13 8773w5d 10:05 / 01:11 2850 g M Vag-Spont EPI  LIV  2 SAB 02/16/12 7521w0d         1 SAB 12/17/03 6882w0d          Past Surgical History:  Procedure Laterality Date  . ADENOIDECTOMY    . COLPOSCOPY    . CRYOTHERAPY    . LEEP    . MYRINGOTOMY WITH TUBE PLACEMENT     Family History  Problem Relation Age of Onset  . Hypertension Mother   . Diabetes Father   . Hypertension  Father   . Cancer Paternal Aunt        female  . Diabetes Paternal Uncle   . Diabetes Paternal Grandmother   . Hypertension Paternal Grandmother   . Diabetes Paternal Grandfather   . Hypertension Paternal Grandfather    Social History   Tobacco Use  . Smoking status: Former Smoker    Packs/day: 0.25    Types: Cigarettes    Last attempt to quit: 08/05/2017    Years since quitting: 0.6  . Smokeless tobacco: Never Used  Substance Use Topics  . Alcohol use: Never    Frequency: Never  . Drug use: No   Allergies  Allergen Reactions  . Augmentin [Amoxicillin-Pot Clavulanate] Diarrhea and Nausea And Vomiting       . Ciprofloxacin Hives  . Doxycycline Hives  . Hydrocodone Hives  . Prednisone     Had a reaction to high doses and was told not to take again  . Raspberry Swelling    Throat swelling   Medications Prior to Admission  Medication Sig Dispense Refill Last Dose  . acetaminophen (TYLENOL) 500 MG tablet Take  2 tablets (1,000 mg total) by mouth every 6 (six) hours as needed. 30 tablet 0 Taking  . cyclobenzaprine (FLEXERIL) 10 MG tablet Take 1 tablet (10 mg total) by mouth every 8 (eight) hours as needed for muscle spasms. 20 tablet 0 Taking  . escitalopram (LEXAPRO) 10 MG tablet Take 1 tablet (10 mg total) by mouth daily. 30 tablet 11 Taking  . ferrous sulfate 325 (65 FE) MG tablet Take 1 tablet (325 mg total) by mouth 2 (two) times daily with a meal. 60 tablet 5 Taking  . Menthol, Topical Analgesic, (BIOFREEZE) 4 % GEL Applied per package instructions. 1 Tube 0 Taking  . omeprazole (PRILOSEC) 20 MG capsule Take 1 capsule (20 mg total) by mouth 2 (two) times daily before a meal. 60 capsule 5 Taking  . ondansetron (ZOFRAN) 8 MG tablet Take by mouth every 8 (eight) hours as needed for nausea or vomiting.   Taking  . Prenatal Vit-Fe Fumarate-FA (PRENATAL MULTIVITAMIN) TABS tablet Take 1 tablet by mouth daily at 12 noon.   Taking  . promethazine (PHENERGAN) 25 MG tablet Take 1  tablet (25 mg total) by mouth every 6 (six) hours as needed for nausea or vomiting. 30 tablet 2 Taking  . terconazole (TERAZOL 7) 0.4 % vaginal cream Place 1 applicator vaginally at bedtime. 45 g 0     I have reviewed patient's Past Medical Hx, Surgical Hx, Family Hx, Social Hx, medications and allergies.   ROS:  Review of Systems  Constitutional: Negative.   Eyes: Positive for visual disturbance.  Gastrointestinal: Negative.   Genitourinary: Negative.   Neurological: Positive for headaches.    Physical Exam   Patient Vitals for the past 24 hrs:  BP Temp Temp src Pulse Resp SpO2 Weight  03/25/18 2001 112/72 - - 73 18 98 % -  03/25/18 1746 117/78 98.1 F (36.7 C) Oral 100 17 99 % 88.9 kg    Constitutional: Well-developed, well-nourished female in no acute distress.  Cardiovascular: normal rate & rhythm, no murmur Respiratory: normal effort, lung sounds clear throughout GI: Abd soft, non-tender, gravid appropriate for gestational age. Pos BS x 4 MS: Extremities nontender, no edema, normal ROM Neurologic: Alert and oriented x 4.   NST:  Baseline: 155 bpm, Variability: Good {> 6 bpm), Accelerations: Reactive and Decelerations: Absent   Labs: No results found for this or any previous visit (from the past 24 hour(s)).  Imaging:  No results found.  MAU Course: Orders Placed This Encounter  Procedures  . Insert peripheral IV  . Discharge patient   Meds ordered this encounter  Medications  . AND Linked Order Group   . 0.9 %  sodium chloride infusion   . diphenhydrAMINE (BENADRYL) injection 25 mg   . metoCLOPramide (REGLAN) injection 10 mg   . dexamethasone (DECADRON) injection 10 mg     Pt has taken before without issue.  . metoCLOPramide (REGLAN) 10 MG tablet    Sig: Take 1 tablet (10 mg total) by mouth every 8 (eight) hours as needed (migraine headache).    Dispense:  30 tablet    Refill:  0    Order Specific Question:   Supervising Provider    Answer:   Samara Snide    MDM: Reactive NST Headache cocktail given (decadron, reglan, benadryl). Pt reports improvement in symptoms and requesting to be discharged home. Will rx reglan to have at home to use in conjunction with tylenol for future headaches. Encouraged to call [facility] to reschedule  ob appt that was missed today.   Assessment: 1. Migraine without aura and without status migrainosus, not intractable   2. [redacted] weeks gestation of pregnancy     Plan: Discharge home in stable condition.  Labor precautions and fetal kick counts Follow-up Information    [facility] Follow up.   Contact information: [address] [place] [address] [phone]          Allergies as of 03/25/2018      Reactions   Augmentin [amoxicillin-pot Clavulanate] Diarrhea, Nausea And Vomiting       Ciprofloxacin Hives   Doxycycline Hives   Hydrocodone Hives   Prednisone    Had a reaction to high doses and was told not to take again   Raspberry Swelling   Throat swelling      Medication List    STOP taking these medications   promethazine 25 MG tablet Commonly known as:  PHENERGAN     TAKE these medications   acetaminophen 500 MG tablet Commonly known as:  TYLENOL Take 2 tablets (1,000 mg total) by mouth every 6 (six) hours as needed.   cyclobenzaprine 10 MG tablet Commonly known as:  FLEXERIL Take 1 tablet (10 mg total) by mouth every 8 (eight) hours as needed for muscle spasms.   escitalopram 10 MG tablet Commonly known as:  LEXAPRO Take 1 tablet (10 mg total) by mouth daily.   ferrous sulfate 325 (65 FE) MG tablet Take 1 tablet (325 mg total) by mouth 2 (two) times daily with a meal.   Menthol (Topical Analgesic) 4 % Gel Commonly known as:  BIOFREEZE Applied per package instructions.   metoCLOPramide 10 MG tablet Commonly known as:  REGLAN Take 1 tablet (10 mg total) by mouth every 8 (eight) hours as needed (migraine headache).    omeprazole 20 MG capsule Commonly known as:  PRILOSEC Take 1 capsule (20 mg total) by mouth 2 (two) times daily before a meal.   ondansetron 8 MG tablet Commonly known as:  ZOFRAN Take by mouth every 8 (eight) hours as needed for nausea or vomiting.   prenatal multivitamin Tabs tablet Take 1 tablet by mouth daily at 12 noon.   terconazole 0.4 % vaginal cream Commonly known as:  TERAZOL 7 Place 1 applicator vaginally at bedtime.       Judeth Horn, NP 03/25/2018 8:27 PM

## 2018-03-27 ENCOUNTER — Other Ambulatory Visit: Payer: Self-pay | Admitting: Obstetrics

## 2018-03-27 DIAGNOSIS — K029 Dental caries, unspecified: Secondary | ICD-10-CM

## 2018-03-27 DIAGNOSIS — K047 Periapical abscess without sinus: Secondary | ICD-10-CM

## 2018-03-27 MED ORDER — CLINDAMYCIN HCL 300 MG PO CAPS: 300.0000 mg | ORAL_CAPSULE | Freq: Three times a day (TID) | ORAL | 0 refills | Status: DC

## 2018-03-27 MED ORDER — OXYCODONE-ACETAMINOPHEN 5-325 MG PO TABS: 1.0000 | ORAL_TABLET | ORAL | 0 refills | Status: DC | PRN

## 2018-03-28 ENCOUNTER — Encounter (HOSPITAL_COMMUNITY): Payer: Self-pay

## 2018-03-28 ENCOUNTER — Inpatient Hospital Stay (HOSPITAL_COMMUNITY)
Admission: AD | Admit: 2018-03-28 | Discharge: 2018-03-28 | Disposition: A | Discharge status: Home / Self Care | Payer: Medicaid Other | Source: Ambulatory Visit | Attending: Family Medicine | Admitting: Family Medicine

## 2018-03-28 ENCOUNTER — Other Ambulatory Visit: Payer: Self-pay

## 2018-03-28 DIAGNOSIS — Z3A38 38 weeks gestation of pregnancy: Secondary | ICD-10-CM | POA: Diagnosis not present

## 2018-03-28 DIAGNOSIS — Z87891 Personal history of nicotine dependence: Secondary | ICD-10-CM | POA: Insufficient documentation

## 2018-03-28 DIAGNOSIS — N949 Unspecified condition associated with female genital organs and menstrual cycle: Secondary | ICD-10-CM | POA: Diagnosis not present

## 2018-03-28 DIAGNOSIS — R109 Unspecified abdominal pain: Secondary | ICD-10-CM | POA: Diagnosis present

## 2018-03-28 DIAGNOSIS — O26893 Other specified pregnancy related conditions, third trimester: Secondary | ICD-10-CM | POA: Insufficient documentation

## 2018-03-28 LAB — URINALYSIS, ROUTINE W REFLEX MICROSCOPIC
Bacteria, UA: NONE SEEN
Bilirubin Urine: NEGATIVE
Glucose, UA: NEGATIVE mg/dL
Hgb urine dipstick: NEGATIVE
Ketones, ur: NEGATIVE mg/dL
Nitrite: NEGATIVE
Protein, ur: NEGATIVE mg/dL
Specific Gravity, Urine: 1.025 (ref 1.005–1.030)
pH: 5 (ref 5.0–8.0)

## 2018-03-28 NOTE — MAU Note (Signed)
Woke up with abscessed  Tooth, upper right. OB called in Clindamycin, this is a new antibiotic.  Took it this morning.  Feels dizzy, can't catch her breath, burning from the inside out.  There is pain in her pelvic region, feels like she is splitting open.  Back on sacrum-- pushing out and wraps around hips.  Baby is moving fine.

## 2018-03-28 NOTE — MAU Provider Note (Signed)
Chief Complaint:  Pelvic Pain and Back Pain   First Provider Initiated Contact with Patient 03/28/18 1445     HPI: Valerie [name] is a 28 y.o. [id number] at [redacted]w[redacted]d who presents to maternity admissions reporting abdominal pain & pelvic pressure. Symptoms started today. Comes & goes. Feels like when pain comes she gets hot and her heart rate races. Denies LOF, vaginal bleeding, CP, or SOB. Normal fetal movement.  Pelvic pressure and back pain is worse with walking.   Location: back/pelvis Quality: pressure & sharp Severity: 5/10 in pain scale Duration: <1 day Timing: intermittent Modifying factors: worse with walking Associated signs and symptoms: heart races with pain   Past Medical History:  Diagnosis Date  . Abnormal Pap smear   . Anemia   . Anxiety   . 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 BV (bacterial vaginosis)   . Eczema   . First degree heart block   . Gastric ulcer    2000  . Heart murmur   . Infection    UTI, chronic sinus inf  . Migraine   . Mitral valve regurgitation   . Ovarian cyst   . Tricuspid valve regurgitation   . UTI (urinary tract infection)    OB History  Gravida Para Term Preterm AB Living  4 1 1   2 1   SAB TAB Ectopic Multiple Live Births  2       1    # Outcome Date GA Lbr Len/2nd Weight Sex Delivery Anes PTL Lv  4 Current           3 Term 07/07/13 [redacted]w[redacted]d 10:05 / 01:11 2850 g M Vag-Spont EPI  LIV  2 SAB 02/16/12 [redacted]w[redacted]d         1 SAB 12/17/03 [redacted]w[redacted]d          Past Surgical History:  Procedure Laterality Date  . ADENOIDECTOMY    . COLPOSCOPY    . CRYOTHERAPY    . LEEP    . MYRINGOTOMY WITH TUBE PLACEMENT     Family History  Problem Relation Age of Onset  . Hypertension Mother   . Diabetes Father   . Hypertension Father   . Cancer Paternal Aunt        female  . Diabetes Paternal Uncle   . Diabetes Paternal Grandmother   . Hypertension Paternal Grandmother   . Diabetes Paternal Grandfather   . Hypertension Paternal  Grandfather    Social History   Tobacco Use  . Smoking status: Former Smoker    Packs/day: 0.25    Types: Cigarettes    Last attempt to quit: 08/05/2017    Years since quitting: 0.6  . Smokeless tobacco: Never Used  Substance Use Topics  . Alcohol use: Never    Frequency: Never  . Drug use: No   Allergies  Allergen Reactions  . Augmentin [Amoxicillin-Pot Clavulanate] Diarrhea and Nausea And Vomiting       . Ciprofloxacin Hives  . Doxycycline Hives  . Hydrocodone Hives  . Prednisone     Had a reaction to high doses and was told not to take again  . Raspberry Swelling    Throat swelling   No medications prior to admission.    I have reviewed patient's Past Medical Hx, Surgical Hx, Family Hx, Social Hx, medications and allergies.   ROS:  Review of Systems  Constitutional: Negative.   Cardiovascular: Positive for palpitations. Negative for chest pain.  Gastrointestinal:  Positive for abdominal pain. Negative for constipation, diarrhea, nausea and vomiting.  Genitourinary: Positive for pelvic pain. Negative for vaginal bleeding and vaginal discharge.  Musculoskeletal: Positive for back pain.    Physical Exam   Patient Vitals for the past 24 hrs:  BP Pulse Resp  03/28/18 1617 - - 18  03/28/18  1347 120/74 106 18  03/28/18 1604 112/68 99 18    Constitutional: Well-developed, well-nourished female in no acute distress.  Cardiovascular: normal rate & rhythm, no murmur Respiratory: normal effort, lung sounds clear throughout GI: Abd soft, non-tender, gravid appropriate for gestational age. Pos BS x 4 MS: Extremities nontender, no edema, normal ROM Neurologic: Alert and oriented x 4.  GU:      Pelvic: NEFG, physiologic discharge, no blood, cervix clean.   Dilation: 3 Effacement (%): 50 Cervical Position: Middle Station: -2 Presentation: Undeterminable Exam by:: Marvel PlanJessica Hashem RN   NST:  Baseline: 150 bpm, Variability: Good {> 6 bpm), Accelerations: Reactive and  Decelerations: Absent   Labs: Results for orders placed or performed during the hospital encounter of 03/28/18 (from the past 48 hour(s))  Urinalysis, Routine w reflex microscopic     Status: Abnormal   Collection Time: 03/28/18  1:55 PM  Result Value Ref Range   Color, Urine YELLOW YELLOW   APPearance HAZY (A) CLEAR   Specific Gravity, Urine 1.025 1.005 - 1.030   pH 5.0 5.0 - 8.0   Glucose, UA NEGATIVE NEGATIVE mg/dL   Hgb urine dipstick NEGATIVE NEGATIVE   Bilirubin Urine NEGATIVE NEGATIVE   Ketones, ur NEGATIVE NEGATIVE mg/dL   Protein, ur NEGATIVE NEGATIVE mg/dL   Nitrite NEGATIVE NEGATIVE   Leukocytes, UA SMALL (A) NEGATIVE   RBC / HPF 0-5 0 - 5 RBC/hpf   WBC, UA 6-10 0 - 5 WBC/hpf   Bacteria, UA NONE SEEN NONE SEEN   Squamous Epithelial / LPF 6-10 0 - 5   Mucus PRESENT     Comment: Performed at [facility], [address] [place], [place] [id number]     Imaging:  No results found.  MAU Course: Orders Placed This Encounter  Procedures  . Urinalysis, Routine w reflex microscopic  . Discharge patient   No orders of the defined types were placed in this encounter.   MDM: Reactive NST VSS, NAD From description of pain, pt likely having response to contractions. Watched for 1+ hour without change in cervical exam. Pt reassured.  Assessment: 1. Pelvic pressure in pregnancy, antepartum, third trimester   2. [redacted] weeks gestation of pregnancy     Plan: Discharge home in stable condition.  Labor precautions and fetal kick counts   Allergies as of 03/28/2018      Reactions   Augmentin [amoxicillin-pot Clavulanate] Diarrhea, Nausea And Vomiting       Ciprofloxacin Hives   Doxycycline Hives   Hydrocodone Hives   Prednisone    Had a reaction to high doses and was told not to take again   Raspberry Swelling   Throat swelling      Medication List    TAKE these medications   acetaminophen 500 MG tablet Commonly known as:  TYLENOL Take 2 tablets (1,000 mg  total) by mouth every 6 (six) hours as needed.   clindamycin 300 MG capsule Commonly known as:  CLEOCIN Take 1 capsule (300 mg total) by mouth 3 (three) times daily.   cyclobenzaprine 10 MG tablet Commonly known as:  FLEXERIL Take 1 tablet (10 mg total) by mouth every 8 (eight)  hours as needed for muscle spasms.   escitalopram 10 MG tablet Commonly known as:  LEXAPRO Take 1 tablet (10 mg total) by mouth daily.   ferrous sulfate 325 (65 FE) MG tablet Take 1 tablet (325 mg total) by mouth 2 (two) times daily with a meal.   Menthol (Topical Analgesic) 4 % Gel Commonly known as:  BIOFREEZE Applied per package instructions.   metoCLOPramide 10 MG tablet Commonly known as:  REGLAN Take 1 tablet (10 mg total) by mouth every 8 (eight) hours as needed (migraine headache).   omeprazole 20 MG capsule Commonly known as:  PRILOSEC Take 1 capsule (20 mg total) by mouth 2 (two) times daily before a meal.   ondansetron 8 MG tablet Commonly known as:  ZOFRAN Take by mouth every 8 (eight) hours as needed for nausea or vomiting.   oxyCODONE-acetaminophen 5-325 MG tablet Commonly known as:  PERCOCET/ROXICET Take 1 tablet by mouth every 4 (four) hours as needed for severe pain.   prenatal multivitamin Tabs tablet Take 1 tablet by mouth daily at 12 noon.   terconazole 0.4 % vaginal cream Commonly known as:  TERAZOL 7 Place 1 applicator vaginally at bedtime.       Judeth Horn, NP 03/29/2018 2:14 PM

## 2018-03-28 NOTE — Discharge Instructions (Signed)

## 2018-03-30 ENCOUNTER — Encounter (HOSPITAL_COMMUNITY): Payer: Self-pay | Admitting: *Deleted

## 2018-03-30 ENCOUNTER — Inpatient Hospital Stay (HOSPITAL_COMMUNITY)
Admission: AD | Admit: 2018-03-30 | Discharge: 2018-03-30 | Disposition: A | Discharge status: Home / Self Care | Payer: Medicaid Other | Attending: Obstetrics and Gynecology | Admitting: Obstetrics and Gynecology

## 2018-03-30 ENCOUNTER — Encounter: Payer: Self-pay | Admitting: Obstetrics

## 2018-03-30 ENCOUNTER — Inpatient Hospital Stay (HOSPITAL_BASED_OUTPATIENT_CLINIC_OR_DEPARTMENT_OTHER): Payer: Medicaid Other

## 2018-03-30 ENCOUNTER — Ambulatory Visit (INDEPENDENT_AMBULATORY_CARE_PROVIDER_SITE_OTHER): Payer: Medicaid Other | Admitting: Obstetrics

## 2018-03-30 ENCOUNTER — Other Ambulatory Visit: Payer: Self-pay

## 2018-03-30 VITALS — BP 112/77 | HR 81 | Wt 198.8 lb

## 2018-03-30 DIAGNOSIS — Z3A39 39 weeks gestation of pregnancy: Secondary | ICD-10-CM

## 2018-03-30 DIAGNOSIS — O99413 Diseases of the circulatory system complicating pregnancy, third trimester: Secondary | ICD-10-CM | POA: Diagnosis not present

## 2018-03-30 DIAGNOSIS — O368390 Maternal care for abnormalities of the fetal heart rate or rhythm, unspecified trimester, not applicable or unspecified: Secondary | ICD-10-CM | POA: Diagnosis present

## 2018-03-30 DIAGNOSIS — O0992 Supervision of high risk pregnancy, unspecified, second trimester: Secondary | ICD-10-CM | POA: Diagnosis not present

## 2018-03-30 DIAGNOSIS — O368330 Maternal care for abnormalities of the fetal heart rate or rhythm, third trimester, not applicable or unspecified: Secondary | ICD-10-CM

## 2018-03-30 DIAGNOSIS — Z87891 Personal history of nicotine dependence: Secondary | ICD-10-CM | POA: Insufficient documentation

## 2018-03-30 DIAGNOSIS — R42 Dizziness and giddiness: Secondary | ICD-10-CM

## 2018-03-30 LAB — URINALYSIS, ROUTINE W REFLEX MICROSCOPIC
Bilirubin Urine: NEGATIVE
Glucose, UA: NEGATIVE mg/dL
Hgb urine dipstick: NEGATIVE
Ketones, ur: NEGATIVE mg/dL
Leukocytes, UA: NEGATIVE
Nitrite: NEGATIVE
PROTEIN: NEGATIVE mg/dL
Specific Gravity, Urine: 1.024 (ref 1.005–1.030)
pH: 5 (ref 5.0–8.0)

## 2018-03-30 NOTE — Discharge Instructions (Signed)
·   You were seen in the MAU for elevated heart rate in your baby. We monitored your and the heart rate looked great. It actually look like accelerations in your baby's heart rate which is normal and very reassuring. We did an ultrasound called a BPP to check on baby's tone and breathing which was also normal.  The EKG or check on your heart was normal.   Drink plenty of fluids and eat small, regular meals.   Return to MAU if worsening symptoms, contractions, vaginal bleeding, or decreased movement in your baby.

## 2018-03-30 NOTE — MAU Provider Note (Addendum)
History    CSN: [id number] Arrival date and time: 03/30/18 1656 First Provider Initiated Contact with Patient 03/30/18 1723     Chief Complaint  Patient presents with  . fetal monitoring   HPI 28yo [id number] at [redacted]w[redacted]d who presents from clinic for elevated fetal heart rate in clinic. States she was seen in clinic for routine visit and had elevated FHT so was told to come to MAU for evaluation. Denies contractions, vaginal bleeding, increased discharge. States having tingling in fingers and toes and was told it may be swelling. Feels like something just isn't right. Felt dizzy and nauseous earlier today. Felt "a burning throughout" her body today, feels better lying under air vents. Denies headaches, blurry vision. Describes dizziness as room is spinning and feeling off balance.  Normal appetite, eating regularly because taking clindamycin for tooth abscess. Not drinking as much as she could. Denies any itching in hands or feet.   Mother is with her today. Her mother states she is very worried because of Monai's history of a possible brain stem bleed? Versus Bell's palsy as a teenager. States she presented with blurry vision, drooping of left side of face and ringing in her ears. Was started on steroids then sent to neurologist. Reports being told either bleed, stroke, or bell's palsy but MRIs were all normal. Mother states she gets worried something like that is going on everytime she complains of being dizzy. States her daughter usually gets up and does things on her own but has been more tired.   OB History    Gravida  4   Para  1   Term  1   Preterm      AB  2   Living  1     SAB  2   TAB      Ectopic      Multiple      Live Births  1           Past Medical History:  Diagnosis Date  . Abnormal Pap smear   . Anemia   . Anxiety   . 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 BV (bacterial vaginosis)   . Eczema   . First degree heart block   . Gastric  ulcer    2000  . Heart murmur   . Infection    UTI, chronic sinus inf  . Migraine   . Mitral valve regurgitation   . Ovarian cyst   . Tricuspid valve regurgitation   . UTI (urinary tract infection)     Past Surgical History:  Procedure Laterality Date  . ADENOIDECTOMY    . COLPOSCOPY    . CRYOTHERAPY    . LEEP    . MYRINGOTOMY WITH TUBE PLACEMENT      Family History  Problem Relation Age of Onset  . Hypertension Mother   . Diabetes Father   . Hypertension Father   . Cancer Paternal Aunt        female  . Diabetes Paternal Uncle   . Diabetes Paternal Grandmother   . Hypertension Paternal Grandmother   . Diabetes Paternal Grandfather   . Hypertension Paternal Grandfather     Social History   Tobacco Use  . Smoking status: Former Smoker    Packs/day: 0.25    Types: Cigarettes    Last attempt to quit: 08/05/2017    Years since quitting: 0.6  . Smokeless tobacco: Never Used  Substance Use Topics  .  Alcohol use: Never    Frequency: Never  . Drug use: No    Allergies:  Allergies  Allergen Reactions  . Augmentin [Amoxicillin-Pot Clavulanate] Diarrhea and Nausea And Vomiting       . Ciprofloxacin Hives  . Doxycycline Hives  . Hydrocodone Hives  . Prednisone     Had a reaction to high doses and was told not to take again  . Raspberry Swelling    Throat swelling    Medications Prior to Admission  Medication Sig Dispense Refill Last Dose  . acetaminophen (TYLENOL) 500 MG tablet Take 2 tablets (1,000 mg total) by mouth every 6 (six) hours as needed. (Patient not taking: Reported on 03/30/2018) 30 tablet 0 Not Taking  . clindamycin (CLEOCIN) 300 MG capsule Take 1 capsule (300 mg total) by mouth 3 (three) times daily. 30 capsule 0 Taking  . cyclobenzaprine (FLEXERIL) 10 MG tablet Take 1 tablet (10 mg total) by mouth every 8 (eight) hours as needed for muscle spasms. (Patient not taking: Reported on 03/30/2018) 20 tablet 0 Not Taking  . escitalopram (LEXAPRO) 10 MG  tablet Take 1 tablet (10 mg total) by mouth daily. 30 tablet 11 Taking  . ferrous sulfate 325 (65 FE) MG tablet Take 1 tablet (325 mg total) by mouth 2 (two) times daily with a meal. (Patient not taking: Reported on 03/30/2018) 60 tablet 5 Not Taking  . Menthol, Topical Analgesic, (BIOFREEZE) 4 % GEL Applied per package instructions. (Patient not taking: Reported on 03/30/2018) 1 Tube 0 Not Taking  . metoCLOPramide (REGLAN) 10 MG tablet Take 1 tablet (10 mg total) by mouth every 8 (eight) hours as needed (migraine headache). 30 tablet 0 Taking  . omeprazole (PRILOSEC) 20 MG capsule Take 1 capsule (20 mg total) by mouth 2 (two) times daily before a meal. 60 capsule 5 Taking  . ondansetron (ZOFRAN) 8 MG tablet Take by mouth every 8 (eight) hours as needed for nausea or vomiting.   Not Taking  . oxyCODONE-acetaminophen (PERCOCET/ROXICET) 5-325 MG tablet Take 1 tablet by mouth every 4 (four) hours as needed for severe pain. 20 tablet 0 Taking  . Prenatal Vit-Fe Fumarate-FA (PRENATAL MULTIVITAMIN) TABS tablet Take 1 tablet by mouth daily at 12 noon.   Taking  . terconazole (TERAZOL 7) 0.4 % vaginal cream Place 1 applicator vaginally at bedtime. (Patient not taking: Reported on 03/30/2018) 45 g 0 Not Taking    Review of Systems  Constitutional: Positive for activity change, appetite change and fatigue. Negative for fever.  HENT: Positive for dental problem, sore throat (wondering if has thrush in throat?) and trouble swallowing.   Eyes: Negative for visual disturbance.  Respiratory: Positive for shortness of breath (when feels dizzy). Negative for chest tightness.   Cardiovascular: Negative for chest pain and palpitations.  Gastrointestinal: Negative for abdominal pain.  Genitourinary: Negative for dysuria, flank pain, vaginal bleeding and vaginal discharge.  Musculoskeletal: Positive for back pain (back contractions occasional).  Skin: Negative for rash.  Neurological: Positive for dizziness and  light-headedness. Negative for syncope and headaches.  Psychiatric/Behavioral: Positive for sleep disturbance. The patient is nervous/anxious.    Physical Exam   Blood pressure 122/80, pulse (!) 102, temperature 98.4 F (36.9 C), resp. rate 16, last menstrual period 06/30/2017, SpO2 99 %.  Physical Exam  Nursing note and vitals reviewed. Constitutional: She is oriented to person, place, and time. She appears well-developed and well-nourished. No distress.  HENT:  Head: Normocephalic and atraumatic.  Mouth/Throat: Oropharynx is clear and moist. No  oropharyngeal exudate.  Eyes: Conjunctivae and EOM are normal. No scleral icterus.  Neck: Neck supple. No thyromegaly present.  Cardiovascular: Normal rate, regular rhythm, normal heart sounds and intact distal pulses.  No murmur heard. Respiratory: Effort normal and breath sounds normal. She has no wheezes.  GI: Soft. There is no abdominal tenderness. There is no guarding.  Gravid, appropriate fundal height  Musculoskeletal:        General: No edema.  Lymphadenopathy:    She has no cervical adenopathy.  Neurological: She is alert and oriented to person, place, and time. No cranial nerve deficit.  5/5 strength UEs  Skin: Skin is warm and dry. No rash noted.  Psychiatric: She has a normal mood and affect. Her behavior is normal.   MAU Course  Procedures  MDM -- reactive NST: 150s/mod/+a/-d, not having contractions -- will do orthostatic BP and EKG given dizziness and history of heart block - EKG with NSR, borderline 1st degree heart block (PR interval 200), inverted T-waves in leads 3,4 but no reciprocal changes  normal axis, no obvious ischemic changes  -- continues to have reactive tracing with accelerations up to 180-190s but returns to baseline in 150s -- consulted with Dr. Alysia Penna who reviewed strip and recommended BPP, will check now -- induction already scheduled for next week (elective)  -- BPP reassuring 8/8  Assessment and  Plan  27yo [id number] at [redacted]w[redacted]d who presents from clinic because of elevated fetal heart rate. Normal, reactive NST here with BPP 8/8. Some elevations in heart rate with accelerations but returns to normal baseline in 150s. Counseled on return precautions. Encouraged increased fluids, minimal caffeine intake, small and regular meals. Has follow-up appointment for next week and elective induction scheduled. All questions answered, mother and patient in agreement with the plan.   Tamera Stands, DO  03/30/2018, 5:52 PM

## 2018-03-30 NOTE — MAU Note (Signed)
Urine in lab 

## 2018-03-30 NOTE — Progress Notes (Signed)
Pt presents for ROB c/o fingers and toes tingling.

## 2018-03-30 NOTE — Progress Notes (Signed)
Subjective:  Valerie [name] is a 28 y.o. [phone] at [redacted]w[redacted]d being seen today for ongoing prenatal care.  She is currently monitored for the following issues for this high-risk pregnancy and has Former smoker, stopped smoking in distant past; Family history of diabetes in pregnancy; History of facial weakness/Neuro workup/Lesion on MRI w/out change; Skin tag of perianal region; Depressive disorder, not elsewhere classified; Supervision of high risk pregnancy, antepartum, second trimester; Heart block; Unwanted fertility; Palpitations; Cardiac murmur; First degree AV block; Hereditary disease in family possibly affecting fetus, antepartum; and Fetal abnormality affecting management of mother, antepartum on their problem list.  Patient reports tingling of fingers and toes and just " feeling miserable with occasional palpatations ".  Contractions: Irregular. Vag. Bleeding: None.  Movement: Present. Denies leaking of fluid.   The following portions of the patient's history were reviewed and updated as appropriate: allergies, current medications, past family history, past medical history, past social history, past surgical history and problem list. Problem list updated.  Objective:   Vitals:   03/30/18 1510  BP: 112/77  Pulse: 81  Weight: 198 lb 12.8 oz (90.2 kg)    Fetal Status: Fetal Heart Rate (bpm): nst   Movement: Present     General:  Alert, oriented and cooperative. Patient is in no acute distress.  Skin: Skin is warm and dry. No rash noted.   Cardiovascular: Normal heart rate noted  Respiratory: Normal respiratory effort, no problems with respiration noted  Abdomen: Soft, gravid, appropriate for gestational age. Pain/Pressure: Present     Pelvic:  Cervical exam deferred        Extremities: Normal range of motion.  Edema: Trace  Mental Status: Normal mood and affect. Normal behavior. Normal judgment and thought content.   Urinalysis:      Assessment and Plan:  Pregnancy: G4P1021 at  [redacted]w[redacted]d  1. Supervision of high risk pregnancy, antepartum, second trimester Rx: - Fetal nonstress test:  Reactive.  FHR baseline 150-160 bpm.  15x15 accels.  Occasional sharp variables.  Term labor symptoms and general obstetric precautions including but not limited to vaginal bleeding, contractions, leaking of fluid and fetal movement were reviewed in detail with the patient. Please refer to After Visit Summary for other counseling recommendations.  Return in about 1 week (around 04/06/2018) for ROB.   Brock Bad, MD

## 2018-03-30 NOTE — MAU Note (Signed)
Pt presents to MAU for fetal monitoring, Was evaluated by Dr Clearance Coots today and FHR tachy. Denies any VB or LOF. +FM

## 2018-03-31 ENCOUNTER — Telehealth (HOSPITAL_COMMUNITY): Payer: Self-pay | Admitting: *Deleted

## 2018-03-31 ENCOUNTER — Encounter (HOSPITAL_COMMUNITY): Payer: Self-pay | Admitting: *Deleted

## 2018-03-31 ENCOUNTER — Inpatient Hospital Stay (HOSPITAL_COMMUNITY)
Admission: AD | Admit: 2018-03-31 | Discharge: 2018-04-02 | DRG: 796 | Disposition: A | Discharge status: Home / Self Care | Payer: Medicaid Other | Attending: Obstetrics and Gynecology | Admitting: Obstetrics and Gynecology

## 2018-03-31 DIAGNOSIS — Z3490 Encounter for supervision of normal pregnancy, unspecified, unspecified trimester: Secondary | ICD-10-CM

## 2018-03-31 DIAGNOSIS — I440 Atrioventricular block, first degree: Secondary | ICD-10-CM | POA: Diagnosis present

## 2018-03-31 DIAGNOSIS — D649 Anemia, unspecified: Secondary | ICD-10-CM | POA: Diagnosis present

## 2018-03-31 DIAGNOSIS — K047 Periapical abscess without sinus: Secondary | ICD-10-CM | POA: Diagnosis present

## 2018-03-31 DIAGNOSIS — O9942 Diseases of the circulatory system complicating childbirth: Secondary | ICD-10-CM | POA: Diagnosis present

## 2018-03-31 DIAGNOSIS — O26893 Other specified pregnancy related conditions, third trimester: Secondary | ICD-10-CM | POA: Diagnosis present

## 2018-03-31 DIAGNOSIS — Z3A39 39 weeks gestation of pregnancy: Secondary | ICD-10-CM

## 2018-03-31 DIAGNOSIS — O326XX0 Maternal care for compound presentation, not applicable or unspecified: Principal | ICD-10-CM | POA: Diagnosis present

## 2018-03-31 DIAGNOSIS — Z302 Encounter for sterilization: Secondary | ICD-10-CM

## 2018-03-31 DIAGNOSIS — O9902 Anemia complicating childbirth: Secondary | ICD-10-CM | POA: Diagnosis present

## 2018-03-31 DIAGNOSIS — Z87891 Personal history of nicotine dependence: Secondary | ICD-10-CM

## 2018-03-31 HISTORY — DX: Major depressive disorder, single episode, unspecified: F32.9

## 2018-03-31 HISTORY — DX: Depression, unspecified: F32.A

## 2018-03-31 LAB — POCT FERN TEST: POCT Fern Test: POSITIVE

## 2018-03-31 MED ORDER — LACTATED RINGERS IV SOLN
INTRAVENOUS | Status: DC
  Administered 2018-04-01 (×4): via INTRAVENOUS

## 2018-03-31 NOTE — MAU Note (Signed)
PT SAYS SROM  AT 10PM-   CLEAR .  WHILE SITTING ON COUCH- STOOD AND FLUID CAME OUT.  AND STILL COMING OUT .   PNC  WITH FAMINA.         VE  ON SAT IN MAU - 3 CM.   DENIES HSV .   HAD MRSA IN 2010.  GBS- NEG

## 2018-03-31 NOTE — Telephone Encounter (Signed)
Preadmission screen  

## 2018-04-01 ENCOUNTER — Encounter (HOSPITAL_COMMUNITY)
Admission: AD | Disposition: A | Discharge status: Home / Self Care | Payer: Self-pay | Source: Home / Self Care | Attending: Obstetrics and Gynecology

## 2018-04-01 ENCOUNTER — Inpatient Hospital Stay (HOSPITAL_COMMUNITY): Payer: Medicaid Other | Admitting: Anesthesiology

## 2018-04-01 ENCOUNTER — Other Ambulatory Visit: Payer: Self-pay

## 2018-04-01 ENCOUNTER — Encounter (HOSPITAL_COMMUNITY): Payer: Self-pay

## 2018-04-01 DIAGNOSIS — Z3A39 39 weeks gestation of pregnancy: Secondary | ICD-10-CM

## 2018-04-01 DIAGNOSIS — Z3490 Encounter for supervision of normal pregnancy, unspecified, unspecified trimester: Secondary | ICD-10-CM

## 2018-04-01 DIAGNOSIS — Z87891 Personal history of nicotine dependence: Secondary | ICD-10-CM | POA: Diagnosis not present

## 2018-04-01 DIAGNOSIS — D649 Anemia, unspecified: Secondary | ICD-10-CM | POA: Diagnosis present

## 2018-04-01 DIAGNOSIS — O9902 Anemia complicating childbirth: Secondary | ICD-10-CM | POA: Diagnosis present

## 2018-04-01 DIAGNOSIS — O9942 Diseases of the circulatory system complicating childbirth: Secondary | ICD-10-CM | POA: Diagnosis present

## 2018-04-01 DIAGNOSIS — K047 Periapical abscess without sinus: Secondary | ICD-10-CM | POA: Diagnosis present

## 2018-04-01 DIAGNOSIS — O26893 Other specified pregnancy related conditions, third trimester: Secondary | ICD-10-CM | POA: Diagnosis present

## 2018-04-01 DIAGNOSIS — Z302 Encounter for sterilization: Secondary | ICD-10-CM | POA: Diagnosis not present

## 2018-04-01 DIAGNOSIS — I440 Atrioventricular block, first degree: Secondary | ICD-10-CM | POA: Diagnosis present

## 2018-04-01 DIAGNOSIS — O326XX0 Maternal care for compound presentation, not applicable or unspecified: Secondary | ICD-10-CM | POA: Diagnosis present

## 2018-04-01 HISTORY — PX: TUBAL LIGATION: SHX77

## 2018-04-01 LAB — CBC
HCT: 33.8 % — ABNORMAL LOW (ref 36.0–46.0)
Hemoglobin: 11 g/dL — ABNORMAL LOW (ref 12.0–15.0)
MCH: 32 pg (ref 26.0–34.0)
MCHC: 32.5 g/dL (ref 30.0–36.0)
MCV: 98.3 fL (ref 80.0–100.0)
Platelets: 245 10*3/uL (ref 150–400)
RBC: 3.44 MIL/uL — AB (ref 3.87–5.11)
RDW: 14.5 % (ref 11.5–15.5)
WBC: 10.9 10*3/uL — ABNORMAL HIGH (ref 4.0–10.5)
nRBC: 0 % (ref 0.0–0.2)

## 2018-04-01 LAB — TYPE AND SCREEN
ABO/RH(D): O POS
Antibody Screen: NEGATIVE

## 2018-04-01 LAB — RPR: RPR Ser Ql: NONREACTIVE

## 2018-04-01 SURGERY — LIGATION, FALLOPIAN TUBE, POSTPARTUM
Anesthesia: Epidural | Site: Abdomen | Wound class: Clean Contaminated

## 2018-04-01 MED ORDER — METOCLOPRAMIDE HCL 5 MG/ML IJ SOLN: 10.0000 mg | Freq: Once | INTRAMUSCULAR | Status: DC | PRN

## 2018-04-01 MED ORDER — DEXAMETHASONE SODIUM PHOSPHATE 4 MG/ML IJ SOLN
INTRAMUSCULAR | Status: DC | PRN
  Administered 2018-04-01: 4 mg via INTRAVENOUS

## 2018-04-01 MED ORDER — SOD CITRATE-CITRIC ACID 500-334 MG/5ML PO SOLN
30.0000 mL | ORAL | Status: DC | PRN
  Administered 2018-04-01: 30 mL via ORAL
  Filled 2018-04-01: qty 15

## 2018-04-01 MED ORDER — COCONUT OIL OIL
1.0000 "application " | TOPICAL_OIL | Status: DC | PRN
  Administered 2018-04-02: 1 via TOPICAL

## 2018-04-01 MED ORDER — ACETAMINOPHEN 325 MG PO TABS: 650.0000 mg | ORAL_TABLET | ORAL | Status: DC | PRN

## 2018-04-01 MED ORDER — FENTANYL CITRATE (PF) 100 MCG/2ML IJ SOLN
INTRAMUSCULAR | Status: DC | PRN
  Administered 2018-04-01: 100 ug via INTRAVENOUS

## 2018-04-01 MED ORDER — BUPIVACAINE HCL (PF) 0.25 % IJ SOLN
INTRAMUSCULAR | Status: DC | PRN
  Administered 2018-04-01: 20 mL

## 2018-04-01 MED ORDER — LACTATED RINGERS IV SOLN
500.0000 mL | Freq: Once | INTRAVENOUS | Status: AC
  Administered 2018-04-01: 500 mL via INTRAVENOUS

## 2018-04-01 MED ORDER — KETOROLAC TROMETHAMINE 30 MG/ML IJ SOLN
INTRAMUSCULAR | Status: AC
  Filled 2018-04-01: qty 1

## 2018-04-01 MED ORDER — DIPHENHYDRAMINE HCL 50 MG/ML IJ SOLN: 12.5000 mg | INTRAMUSCULAR | Status: DC | PRN

## 2018-04-01 MED ORDER — LIDOCAINE HCL (PF) 1 % IJ SOLN
INTRAMUSCULAR | Status: DC | PRN
  Administered 2018-04-01 (×2): 5 mL via EPIDURAL

## 2018-04-01 MED ORDER — SIMETHICONE 80 MG PO CHEW
80.0000 mg | CHEWABLE_TABLET | ORAL | Status: DC | PRN
  Administered 2018-04-01 – 2018-04-02 (×2): 80 mg via ORAL
  Filled 2018-04-01 (×2): qty 1

## 2018-04-01 MED ORDER — PRENATAL MULTIVITAMIN CH
1.0000 | ORAL_TABLET | Freq: Every day | ORAL | Status: DC
  Administered 2018-04-02: 1 via ORAL
  Filled 2018-04-01: qty 1

## 2018-04-01 MED ORDER — BENZOCAINE-MENTHOL 20-0.5 % EX AERO: 1.0000 "application " | INHALATION_SPRAY | CUTANEOUS | Status: DC | PRN

## 2018-04-01 MED ORDER — ONDANSETRON HCL 4 MG/2ML IJ SOLN: 4.0000 mg | INTRAMUSCULAR | Status: DC | PRN

## 2018-04-01 MED ORDER — MEPERIDINE HCL 25 MG/ML IJ SOLN: 6.2500 mg | INTRAMUSCULAR | Status: DC | PRN

## 2018-04-01 MED ORDER — OXYTOCIN 40 UNITS IN NORMAL SALINE INFUSION - SIMPLE MED: 1.0000 m[IU]/min | INTRAVENOUS | Status: DC

## 2018-04-01 MED ORDER — ONDANSETRON HCL 4 MG PO TABS: 4.0000 mg | ORAL_TABLET | ORAL | Status: DC | PRN

## 2018-04-01 MED ORDER — MIDAZOLAM HCL 2 MG/2ML IJ SOLN
INTRAMUSCULAR | Status: DC | PRN
  Administered 2018-04-01: 2 mg via INTRAVENOUS

## 2018-04-01 MED ORDER — SODIUM CHLORIDE 0.9 % IR SOLN
Status: DC | PRN
  Administered 2018-04-01: 1

## 2018-04-01 MED ORDER — OXYCODONE-ACETAMINOPHEN 5-325 MG PO TABS: 1.0000 | ORAL_TABLET | ORAL | Status: DC | PRN

## 2018-04-01 MED ORDER — SENNOSIDES-DOCUSATE SODIUM 8.6-50 MG PO TABS
2.0000 | ORAL_TABLET | ORAL | Status: DC
  Administered 2018-04-01: 2 via ORAL
  Filled 2018-04-01: qty 2

## 2018-04-01 MED ORDER — PHENYLEPHRINE 40 MCG/ML (10ML) SYRINGE FOR IV PUSH (FOR BLOOD PRESSURE SUPPORT): 80.0000 ug | PREFILLED_SYRINGE | INTRAVENOUS | Status: DC | PRN

## 2018-04-01 MED ORDER — FENTANYL 2.5 MCG/ML BUPIVACAINE 1/10 % EPIDURAL INFUSION (WH - ANES)
INTRAMUSCULAR | Status: AC
  Filled 2018-04-01: qty 100

## 2018-04-01 MED ORDER — DIBUCAINE 1 % RE OINT: 1.0000 "application " | TOPICAL_OINTMENT | RECTAL | Status: DC | PRN

## 2018-04-01 MED ORDER — ONDANSETRON HCL 4 MG/2ML IJ SOLN: 4.0000 mg | Freq: Four times a day (QID) | INTRAMUSCULAR | Status: DC | PRN

## 2018-04-01 MED ORDER — ZOLPIDEM TARTRATE 5 MG PO TABS: 5.0000 mg | ORAL_TABLET | Freq: Every evening | ORAL | Status: DC | PRN

## 2018-04-01 MED ORDER — FENTANYL CITRATE (PF) 100 MCG/2ML IJ SOLN
100.0000 ug | INTRAMUSCULAR | Status: DC | PRN
  Administered 2018-04-01: 100 ug via INTRAVENOUS
  Filled 2018-04-01: qty 2

## 2018-04-01 MED ORDER — FLEET ENEMA 7-19 GM/118ML RE ENEM: 1.0000 | ENEMA | Freq: Every day | RECTAL | Status: DC | PRN

## 2018-04-01 MED ORDER — KETOROLAC TROMETHAMINE 30 MG/ML IJ SOLN
INTRAMUSCULAR | Status: DC | PRN
  Administered 2018-04-01: 30 mg via INTRAVENOUS

## 2018-04-01 MED ORDER — SODIUM BICARBONATE 8.4 % IV SOLN
INTRAVENOUS | Status: AC
  Filled 2018-04-01: qty 50

## 2018-04-01 MED ORDER — FERROUS SULFATE 325 (65 FE) MG PO TABS
325.0000 mg | ORAL_TABLET | Freq: Two times a day (BID) | ORAL | Status: DC
  Administered 2018-04-01 – 2018-04-02 (×3): 325 mg via ORAL
  Filled 2018-04-01 (×3): qty 1

## 2018-04-01 MED ORDER — LIDOCAINE-EPINEPHRINE (PF) 2 %-1:200000 IJ SOLN
INTRAMUSCULAR | Status: AC
  Filled 2018-04-01: qty 20

## 2018-04-01 MED ORDER — LIDOCAINE HCL (PF) 1 % IJ SOLN: 30.0000 mL | INTRAMUSCULAR | Status: DC | PRN

## 2018-04-01 MED ORDER — OXYTOCIN 40 UNITS IN NORMAL SALINE INFUSION - SIMPLE MED
2.5000 [IU]/h | INTRAVENOUS | Status: DC
  Filled 2018-04-01: qty 1000

## 2018-04-01 MED ORDER — ONDANSETRON HCL 4 MG/2ML IJ SOLN
INTRAMUSCULAR | Status: DC | PRN
  Administered 2018-04-01: 4 mg via INTRAVENOUS

## 2018-04-01 MED ORDER — EPHEDRINE 5 MG/ML INJ: 10.0000 mg | INTRAVENOUS | Status: DC | PRN

## 2018-04-01 MED ORDER — OXYTOCIN BOLUS FROM INFUSION
500.0000 mL | Freq: Once | INTRAVENOUS | Status: AC
  Administered 2018-04-01: 500 mL via INTRAVENOUS

## 2018-04-01 MED ORDER — ESCITALOPRAM OXALATE 10 MG PO TABS
10.0000 mg | ORAL_TABLET | Freq: Every day | ORAL | Status: DC
  Administered 2018-04-01 – 2018-04-02 (×2): 10 mg via ORAL
  Filled 2018-04-01 (×3): qty 1

## 2018-04-01 MED ORDER — FENTANYL CITRATE (PF) 250 MCG/5ML IJ SOLN
INTRAMUSCULAR | Status: AC
  Filled 2018-04-01: qty 5

## 2018-04-01 MED ORDER — ACETAMINOPHEN 325 MG PO TABS
650.0000 mg | ORAL_TABLET | ORAL | Status: DC | PRN
  Administered 2018-04-01 – 2018-04-02 (×3): 650 mg via ORAL
  Filled 2018-04-01 (×3): qty 2

## 2018-04-01 MED ORDER — PHENYLEPHRINE 40 MCG/ML (10ML) SYRINGE FOR IV PUSH (FOR BLOOD PRESSURE SUPPORT)
PREFILLED_SYRINGE | INTRAVENOUS | Status: AC
  Filled 2018-04-01: qty 10

## 2018-04-01 MED ORDER — FENTANYL 2.5 MCG/ML BUPIVACAINE 1/10 % EPIDURAL INFUSION (WH - ANES)
14.0000 mL/h | INTRAMUSCULAR | Status: DC | PRN
  Administered 2018-04-01: 14 mL/h via EPIDURAL

## 2018-04-01 MED ORDER — FENTANYL CITRATE (PF) 100 MCG/2ML IJ SOLN: 25.0000 ug | INTRAMUSCULAR | Status: DC | PRN

## 2018-04-01 MED ORDER — TERBUTALINE SULFATE 1 MG/ML IJ SOLN: 0.2500 mg | Freq: Once | INTRAMUSCULAR | Status: DC | PRN

## 2018-04-01 MED ORDER — IBUPROFEN 600 MG PO TABS
600.0000 mg | ORAL_TABLET | Freq: Four times a day (QID) | ORAL | Status: DC
  Administered 2018-04-01 – 2018-04-02 (×5): 600 mg via ORAL
  Filled 2018-04-01 (×5): qty 1

## 2018-04-01 MED ORDER — DIPHENHYDRAMINE HCL 25 MG PO CAPS: 25.0000 mg | ORAL_CAPSULE | Freq: Four times a day (QID) | ORAL | Status: DC | PRN

## 2018-04-01 MED ORDER — LACTATED RINGERS IV SOLN: 500.0000 mL | INTRAVENOUS | Status: DC | PRN

## 2018-04-01 MED ORDER — TETANUS-DIPHTH-ACELL PERTUSSIS 5-2.5-18.5 LF-MCG/0.5 IM SUSP: 0.5000 mL | Freq: Once | INTRAMUSCULAR | Status: DC

## 2018-04-01 MED ORDER — MIDAZOLAM HCL 2 MG/2ML IJ SOLN
INTRAMUSCULAR | Status: AC
  Filled 2018-04-01: qty 2

## 2018-04-01 MED ORDER — OXYCODONE-ACETAMINOPHEN 5-325 MG PO TABS: 2.0000 | ORAL_TABLET | ORAL | Status: DC | PRN

## 2018-04-01 MED ORDER — OXYCODONE HCL 5 MG PO TABS
5.0000 mg | ORAL_TABLET | ORAL | Status: DC | PRN
  Administered 2018-04-01 – 2018-04-02 (×4): 5 mg via ORAL
  Filled 2018-04-01 (×4): qty 1

## 2018-04-01 MED ORDER — WITCH HAZEL-GLYCERIN EX PADS: 1.0000 "application " | MEDICATED_PAD | CUTANEOUS | Status: DC | PRN

## 2018-04-01 MED ORDER — FENTANYL CITRATE (PF) 100 MCG/2ML IJ SOLN
INTRAMUSCULAR | Status: AC
  Filled 2018-04-01: qty 2

## 2018-04-01 MED ORDER — BUPIVACAINE HCL (PF) 0.25 % IJ SOLN
INTRAMUSCULAR | Status: AC
  Filled 2018-04-01: qty 30

## 2018-04-01 MED ORDER — LIDOCAINE-EPINEPHRINE (PF) 2 %-1:200000 IJ SOLN
INTRAMUSCULAR | Status: DC | PRN
  Administered 2018-04-01: 5 mL via PERINEURAL
  Administered 2018-04-01: 10 mL via PERINEURAL

## 2018-04-01 SURGICAL SUPPLY — 26 items
APL SKNCLS STERI-STRIP NONHPOA (GAUZE/BANDAGES/DRESSINGS) ×1
BENZOIN TINCTURE PRP APPL 2/3 (GAUZE/BANDAGES/DRESSINGS) ×2 IMPLANT
BLADE SURG 11 STRL SS (BLADE) ×3 IMPLANT
CLIP FILSHIE TUBAL LIGA STRL (Clip) ×2 IMPLANT
CLOSURE STERI STRIP 1/2 X4 (GAUZE/BANDAGES/DRESSINGS) ×2 IMPLANT
DRESSING OPSITE X SMALL 2X3 (GAUZE/BANDAGES/DRESSINGS) ×2 IMPLANT
DRSG OPSITE POSTOP 3X4 (GAUZE/BANDAGES/DRESSINGS) ×3 IMPLANT
DURAPREP 26ML APPLICATOR (WOUND CARE) ×3 IMPLANT
GLOVE BIOGEL PI IND STRL 7.0 (GLOVE) ×1 IMPLANT
GLOVE BIOGEL PI IND STRL 7.5 (GLOVE) ×1 IMPLANT
GLOVE BIOGEL PI INDICATOR 7.0 (GLOVE) ×2
GLOVE BIOGEL PI INDICATOR 7.5 (GLOVE) ×2
GLOVE ECLIPSE 7.5 STRL STRAW (GLOVE) ×3 IMPLANT
GOWN STRL REUS W/TWL LRG LVL3 (GOWN DISPOSABLE) ×6 IMPLANT
NEEDLE HYPO 22GX1.5 SAFETY (NEEDLE) ×3 IMPLANT
NS IRRIG 1000ML POUR BTL (IV SOLUTION) ×3 IMPLANT
PACK ABDOMINAL MINOR (CUSTOM PROCEDURE TRAY) ×3 IMPLANT
PROTECTOR NERVE ULNAR (MISCELLANEOUS) ×3 IMPLANT
SPONGE LAP 18X18 X RAY DECT (DISPOSABLE) ×2 IMPLANT
SPONGE LAP 4X18 RFD (DISPOSABLE) IMPLANT
SUT PLAIN 0 NONE (SUTURE) ×3 IMPLANT
SUT VICRYL 0 UR6 27IN ABS (SUTURE) ×3 IMPLANT
SUT VICRYL 4-0 PS2 18IN ABS (SUTURE) ×3 IMPLANT
SYR CONTROL 10ML LL (SYRINGE) ×3 IMPLANT
TOWEL OR 17X24 6PK STRL BLUE (TOWEL DISPOSABLE) ×6 IMPLANT
TRAY FOLEY W/BAG SLVR 14FR (SET/KITS/TRAYS/PACK) ×3 IMPLANT

## 2018-04-01 NOTE — Anesthesia Postprocedure Evaluation (Addendum)
Anesthesia Post Note  Patient: Valerie [name]  Procedure(s) Performed: POST PARTUM TUBAL LIGATION (N/A Abdomen)     Patient location during evaluation: PACU Anesthesia Type: Epidural Level of consciousness: oriented and awake and alert Pain management: pain level controlled Vital Signs Assessment: post-procedure vital signs reviewed and stable Respiratory status: spontaneous breathing, respiratory function stable and nonlabored ventilation Cardiovascular status: blood pressure returned to baseline and stable Postop Assessment: no headache, no backache, no apparent nausea or vomiting, epidural receding and patient able to bend at knees Anesthetic complications: no                @[email or web address] ([id number])  )Farah Lepak A.

## 2018-04-01 NOTE — Progress Notes (Signed)
Patient ID: Valerie [name], female   DOB: 22-Nov-1990, 28 y.o.   MRN: [id number] Valerie [name] is a 28 y.o. [phone] at [redacted]w[redacted]d admitted for active labor, rupture of membranes  Subjective: Doing well, wants to walk  Objective: BP 114/74   Pulse 78   Temp 98.1 F (36.7 C) (Oral)   Resp 16   Ht 5\' 5"  (1.651 m)   Wt 90.2 kg   LMP 06/30/2017   SpO2 99%   BMI 33.07 kg/m  No intake/output data recorded.  FHT:  FHR: 150 bpm, variability: moderate,  accelerations:  Present,  decelerations:  Absent UC:  q 2-11mins  SVE:   4/80/-2  Labs: Lab Results  Component Value Date   WBC 10.9 (H) 03/31/2018   HGB 11.0 (L) 03/31/2018   HCT 33.8 (L) 03/31/2018   MCV 98.3 03/31/2018   PLT 245 03/31/2018    Assessment / Plan: Spontaneous labor, progressing normally  Labor: Progressing normally Fetal Wellbeing:  Category I Pain Control:  Labor support without medications Pre-eclampsia: n/a I/D:  n/a Anticipated MOD:  NSVD  Cheral Marker CNM, WHNP-BC 04/01/2018, [phone]

## 2018-04-01 NOTE — Progress Notes (Signed)
Patient desires permanent sterilization.    Given patient age, I had a lengthy and detailed conversation regarding risk of regret, risk/benefits, other forms of reversible contraception including LARC; she declines all other modalities and is very firm in her desire for permanent sterilization. Risks of procedure discussed with patient including but not limited to: risk of regret, permanence of method, bleeding, infection, injury to surrounding organs and need for additional procedures.  Failure risk of 1-2 % with increased risk of ectopic gestation if pregnancy occurs was also discussed with patient.  Patient verbalized understanding of these risks and wants to proceed with sterilization.  Written informed consent obtained.  To OR when ready.

## 2018-04-01 NOTE — Discharge Summary (Signed)
Postpartum Discharge Summary   Patient Name: Valerie RougeSarah B Courington DOB: Jan 01, 1991 MRN: [id number]  Date of admission: 03/31/2018 Delivering Provider: Griselda MinerELOGLOS, STEPHENIA M   Date of discharge: 04/02/2018  Admitting diagnosis: 39 WKS, LEAKING FLUIDS Intrauterine pregnancy: 1335w2d     Secondary diagnosis:  Active Problems:   Pregnancy   SVD (spontaneous vaginal delivery)  Additional problems: Maternal first degree heart block, heart murmur  Discharge diagnosis: Term Pregnancy Delivered                                        Post partum procedures:postpartum tubal ligation Augmentation: none Complications: None  Hospital course:  Onset of Labor With Vaginal Delivery     28 y.o. yo [id number] at 6935w2d was admitted in Active Labor on 03/31/2018. Patient had an uncomplicated labor course as follows:  Membrane Rupture Time/Date: 10:00 PM ,03/31/2018   Intrapartum Procedures: Episiotomy: None [1]                                         Lacerations:  None [1]  Patient had a delivery of a Viable infant. 04/01/2018  Information for the patient's newborn:  Evelene CroonRobinson, Girl Shanikwa [[id number]][[id number]]  Delivery Method: Vag-Spont   She had a BTL on PPD#0. Was still having some pain and discomfort related to incision, so she was discharged with oxycodone 5mg  #12. Patient had an uncomplicated postpartum course.  She is ambulating, tolerating a regular diet, passing flatus, and urinating well. Patient was discharged home in stable condition on 04/02/18.  Magnesium Sulfate recieved: No BMZ received: No  Physical exam  Vitals:   04/01/18 1820 04/01/18 2350 04/02/18 0538 04/02/18 0851  BP: 116/72 109/63 113/82 116/78  Pulse: 84 72 74 75  Resp:  18 18 18   Temp:  97.9 F (36.6 C) 98 F (36.7 C) 98.1 F (36.7 C)  TempSrc:  Axillary Oral Oral  SpO2:    99%  Weight:      Height:       General: alert, well-appearing, NAD Lochia: appropriate Uterine Fundus: firm Incision: dressing C/D/I DVT Evaluation: No  significant calf/ankle edema. Labs: Lab Results  Component Value Date   WBC 10.9 (H) 03/31/2018   HGB 11.0 (L) 03/31/2018   HCT 33.8 (L) 03/31/2018   MCV 98.3 03/31/2018   PLT 245 03/31/2018   CMP Latest Ref Rng & Units 12/24/2017  Glucose 70 - 99 mg/dL 84  BUN 6 - 20 mg/dL 5(L)  Creatinine 7.420.44 - 1.00 mg/dL 5.950.56  Sodium 638135 - 756145 mmol/L 133(L)  Potassium 3.5 - 5.1 mmol/L 3.7  Chloride 98 - 111 mmol/L 107  CO2 22 - 32 mmol/L 18(L)  Calcium 8.9 - 10.3 mg/dL 9.0  Total Protein 6.5 - 8.1 g/dL 5.9(L)  Total Bilirubin 0.3 - 1.2 mg/dL 0.3  Alkaline Phos 38 - 126 U/L 74  AST 15 - 41 U/L 15  ALT 0 - 44 U/L 11    Discharge instruction: per After Visit Summary and "Baby and Me Booklet".  After visit meds:  Allergies as of 04/02/2018      Reactions   Augmentin [amoxicillin-pot Clavulanate] Diarrhea, Nausea And Vomiting       Ciprofloxacin Hives   Doxycycline Hives   Hydrocodone Hives   Prednisone    Had a reaction to high doses and  was told not to take again   Raspberry Swelling   Throat swelling      Medication List    STOP taking these medications   oxyCODONE-acetaminophen 5-325 MG tablet Commonly known as:  PERCOCET/ROXICET     TAKE these medications   acetaminophen 325 MG tablet Commonly known as:  TYLENOL Take 650 mg by mouth every 6 (six) hours as needed for mild pain or headache.   BIOFREEZE 4 % Gel Generic drug:  Menthol (Topical Analgesic) Apply 1 application topically 4 (four) times daily as needed (For back pain.).   clindamycin 300 MG capsule Commonly known as:  CLEOCIN Take 1 capsule (300 mg total) by mouth 3 (three) times daily.   escitalopram 10 MG tablet Commonly known as:  LEXAPRO Take 1 tablet (10 mg total) by mouth daily.   ferrous sulfate 325 (65 FE) MG tablet Take 1 tablet (325 mg total) by mouth 2 (two) times daily with a meal.   ibuprofen 800 MG tablet Commonly known as:  ADVIL,MOTRIN Take 1 tablet (800 mg total) by mouth every 8  (eight) hours as needed.   metoCLOPramide 10 MG tablet Commonly known as:  REGLAN Take 1 tablet (10 mg total) by mouth every 8 (eight) hours as needed (migraine headache).   ondansetron 8 MG tablet Commonly known as:  ZOFRAN Take by mouth every 8 (eight) hours as needed for nausea or vomiting.   oxyCODONE 5 MG immediate release tablet Commonly known as:  Oxy IR/ROXICODONE Take 1 tablet (5 mg total) by mouth every 6 (six) hours as needed for up to 3 days for severe pain.   prenatal multivitamin Tabs tablet Take 1 tablet by mouth daily at 12 noon.   promethazine 25 MG tablet Commonly known as:  PHENERGAN Take 25 mg by mouth every 6 (six) hours as needed for nausea or vomiting.   senna-docusate 8.6-50 MG tablet Commonly known as:  Senokot-S Take 2 tablets by mouth at bedtime as needed for mild constipation.      Diet: routine diet Activity: Advance as tolerated. Pelvic rest for 6 weeks.   Outpatient follow up:4-6 weeks Follow up Appt: Future Appointments  Date Time Provider Department Center  04/29/2018 10:30 AM Hermina Staggers, MD [facility] None   Follow up Visit: Follow-up Information    [facility] Schedule an appointment as soon as possible for a visit.   Specialty:  Obstetrics and Gynecology Why:  You should receive a call to schedule a postpartum follow-up visit. If you do not, please call clinic to make an appointment in 4-6 weeks. Contact information: [address] [place] [address] [phone]          Please schedule this patient for Postpartum visit in: 4 weeks with the following provider: MD For C/S patients schedule nurse incision check in weeks 2 weeks: no High risk pregnancy complicated by: maternal heart block and murmur Delivery mode:  SVD Anticipated Birth Control:  BTL done PP PP Procedures needed: none  Schedule Integrated BH visit: no  Newborn Data: Live born female  Birth  Weight:  2780g APGAR: 9, 9   Newborn Delivery   Birth date/time:  04/01/2018 09:13:00 Delivery type:      Baby Feeding: Breast Disposition:home with mother  04/02/2018 Tamera Stands, DO

## 2018-04-01 NOTE — Op Note (Signed)
Valerie [name] 03/31/2018 - 04/01/2018  PREOPERATIVE DIAGNOSIS:  Undesired fertility  POSTOPERATIVE DIAGNOSIS:  Undesired fertility  PROCEDURE:  Postpartum Bilateral Tubal Sterilization using Filshie Clips   SURGEON: Surgeon(s) and Role:    * Wouk, Wilfred Curtis, MD - Primary    * Arvilla Market, DO - Fellow   ANESTHESIA:  Epidural  COMPLICATIONS:  None immediate.  ESTIMATED BLOOD LOSS:  5 mL  FLUIDS: 800 cc LR.  URINE OUTPUT:  400 cc of clear urine.  INDICATIONS: 28 y.o. yo [id number]  with undesired fertility,status post vaginal delivery, desires permanent sterilization. Risks and benefits of procedure discussed with patient including permanence of method, bleeding, infection, injury to surrounding organs and need for additional procedures. Risk failure of 0.5-1% with increased risk of ectopic gestation if pregnancy occurs was also discussed with patient.   FINDINGS:  Normal uterus, tubes, and ovaries. Umbilical hernia noted, no defect appreciated after fascial closure.   TECHNIQUE:  The patient was taken to the operating room where her epidural anesthesia was dosed up to surgical level and found to be adequate.  She was then placed in the dorsal supine position and prepped and draped in sterile fashion.  After an adequate timeout was performed, attention was turned to the patient's abdomen where a small transverse skin incision was made under the umbilical fold. The incision was taken down to the layer of fascia using the scalpel, and fascia was incised, and extended bilaterally using Mayo scissors. The peritoneum was entered in a sharp fashion. Attention was then turned to the patient's uterus, and left fallopian tube was identified and followed out to the fimbriated end.  A Filshie clip was placed on the left fallopian tube about 2 cm from the cornual attachment, with care given to incorporate the underlying mesosalpinx.  A similar process was carried out on the rightl side  allowing for bilateral tubal sterilization.  Good hemostasis was noted overall. The instruments were then removed from the patient's abdomen and the fascial incision was repaired with 0 Vicryl, 20 cc of local was injected, and the skin was closed with a 3-0 Monocryl subcuticular stitch. The patient tolerated the procedure well.  Sponge, lap, and needle counts were correct times two.  The patient was then taken to the recovery room awake, extubated and in stable condition.  Marcy Siren, D.O. OB FELLOW  04/01/2018, 1:14 PM

## 2018-04-01 NOTE — H&P (Addendum)
OBSTETRIC ADMISSION HISTORY AND PHYSICAL  Valerie [name] is a 28 y.o. female [phone] with IUP at 7464w2d by LMP presenting for spontaneous ROM.  She reports +FMs, no VB. Reports LOF everytime she moves. No blurry vision, or peripheral edema. Patient does report headache but states that it is due to a tooth abscess for which she is being treated for. She plans on breast feeding.  She requests BTL for birth control. [facility]    Dating: By LMP 06/30/17 --->  Estimated Date of Delivery: 04/06/18   Prenatal History/Complications: 1st degree heart block diagnosed in early pregnancy   Past Medical History: Past Medical History:  Diagnosis Date  . Abnormal Pap smear   . Anemia   . Anxiety   . 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 BV (bacterial vaginosis)   . Depression   . Eczema   . First degree heart block   . Gastric ulcer    2000  . Heart murmur   . Infection    UTI, chronic sinus inf  . Migraine   . Mitral valve regurgitation   . Ovarian cyst   . Tricuspid valve regurgitation   . UTI (urinary tract infection)     Past Surgical History: Past Surgical History:  Procedure Laterality Date  . ADENOIDECTOMY    . COLPOSCOPY    . CRYOTHERAPY    . LEEP    . MYRINGOTOMY WITH TUBE PLACEMENT      Obstetrical History: OB History    Gravida  4   Para  1   Term  1   Preterm      AB  2   Living  1     SAB  2   TAB      Ectopic      Multiple      Live Births  1           Social History: Social History   Socioeconomic History  . Marital status: Legally Separated    Spouse name: Not on file  . Number of children: Not on file  . Years of education: Not on file  . Highest education level: Not on file  Occupational History  . Not on file  Social Needs  . Financial resource strain: Not on file  . Food insecurity:    Worry: Not on file    Inability: Not on file  . Transportation needs:    Medical: Not  on file    Non-medical: Not on file  Tobacco Use  . Smoking status: Former Smoker    Packs/day: 0.25    Types: Cigarettes    Last attempt to quit: 08/05/2017    Years since quitting: 0.6  . Smokeless tobacco: Never Used  Substance and Sexual Activity  . Alcohol use: Never    Frequency: Never  . Drug use: No  . Sexual activity: Yes    Partners: Male    Birth control/protection: None    Comment: last sex 21 Mar 2018  Lifestyle  . Physical activity:    Days per week: Not on file    Minutes per session: Not on file  . Stress: Not on file  Relationships  . Social connections:    Talks on phone: Not on file    Gets together: Not on file    Attends religious service: Not on file    Active member of club or organization: Not  on file    Attends meetings of clubs or organizations: Not on file    Relationship status: Not on file  Other Topics Concern  . Not on file  Social History Narrative  . Not on file    Family History: Family History  Problem Relation Age of Onset  . Hypertension Mother   . Diabetes Father   . Hypertension Father   . Cancer Paternal Aunt        female  . Diabetes Paternal Uncle   . Diabetes Paternal Grandmother   . Hypertension Paternal Grandmother   . Diabetes Paternal Grandfather   . Hypertension Paternal Grandfather     Allergies: Allergies  Allergen Reactions  . Augmentin [Amoxicillin-Pot Clavulanate] Diarrhea and Nausea And Vomiting       . Ciprofloxacin Hives  . Doxycycline Hives  . Hydrocodone Hives  . Prednisone     Had a reaction to high doses and was told not to take again  . Raspberry Swelling    Throat swelling    Medications Prior to Admission  Medication Sig Dispense Refill Last Dose  . clindamycin (CLEOCIN) 300 MG capsule Take 1 capsule (300 mg total) by mouth 3 (three) times daily. 30 capsule 0 03/31/2018 at Unknown time  . escitalopram (LEXAPRO) 10 MG tablet Take 1 tablet (10 mg total) by mouth daily. 30 tablet 11 03/30/2018  at Unknown time  . metoCLOPramide (REGLAN) 10 MG tablet Take 1 tablet (10 mg total) by mouth every 8 (eight) hours as needed (migraine headache). 30 tablet 0 Past Week at Unknown time  . ondansetron (ZOFRAN) 8 MG tablet Take by mouth every 8 (eight) hours as needed for nausea or vomiting.   Past Month at Unknown time  . oxyCODONE-acetaminophen (PERCOCET/ROXICET) 5-325 MG tablet Take 1 tablet by mouth every 4 (four) hours as needed for severe pain. 20 tablet 0 03/31/2018 at Unknown time  . Prenatal Vit-Fe Fumarate-FA (PRENATAL MULTIVITAMIN) TABS tablet Take 1 tablet by mouth daily at 12 noon.   03/30/2018 at Unknown time  . ferrous sulfate 325 (65 FE) MG tablet Take 1 tablet (325 mg total) by mouth 2 (two) times daily with a meal. (Patient not taking: Reported on 03/30/2018) 60 tablet 5 Not Taking  . omeprazole (PRILOSEC) 20 MG capsule Take 1 capsule (20 mg total) by mouth 2 (two) times daily before a meal. 60 capsule 5 More than a month at Unknown time     Review of Systems   All systems reviewed and negative except as stated in HPI  Blood pressure 107/65, pulse 96, temperature 98 F (36.7 C), temperature source Oral, resp. rate 16, height 5\' 5"  (1.651 m), weight 90.2 kg, last menstrual period 06/30/2017, SpO2 99 %. General appearance: alert, cooperative and no distress Lungs: clear to auscultation bilaterally Heart: regular rate and rhythm Abdomen: soft, non-tender; bowel sounds normal Extremities: Homans sign is negative, no sign of DVT Presentation: cephalic Fetal monitoringBaseline: 125 bpm, Variability: Good {> 6 bpm), Accelerations: 15x15 and Decelerations: Absent Uterine activityFrequency: Every 2-6 minutes Dilation: 3 Effacement (%): 70 Station: -2 Exam by:: Everlene Farrier, RN    Prenatal labs: ABO, Rh: --/--/O POS (01/14 2358) Antibody: NEG (01/14 2358) Rubella: 2.11 (07/22 1617) RPR: Non Reactive (10/17 1018)  HBsAg: Negative (07/22 1617)  HIV: Non Reactive (10/17 1018)   GBS: Negative (12/30 1723)  1 hr Glucola WNL Genetic screening negative Anatomy US normal  Prenatal Transfer Tool  Maternal Diabetes: No Genetic Screening: Normal Maternal Ultrasounds/Referrals: Normal Fetal Ultrasounds  or other Referrals:  Fetal echo. Normal Maternal Substance Abuse:  No Significant Maternal Medications:  None  Significant Maternal Lab Results: None  Results for orders placed or performed during the hospital encounter of 03/31/18 (from the past 24 hour(s))  Fern Test   Collection Time: 03/31/18 11:38 PM  Result Value Ref Range   POCT Fern Test Positive = ruptured amniotic membanes   CBC   Collection Time: 03/31/18 11:58 PM  Result Value Ref Range   WBC 10.9 (H) 4.0 - 10.5 K/uL   RBC 3.44 (L) 3.87 - 5.11 MIL/uL   Hemoglobin 11.0 (L) 12.0 - 15.0 g/dL   HCT 40.933.8 (L) 81.136.0 - 91.446.0 %   MCV 98.3 80.0 - 100.0 fL   MCH 32.0 26.0 - 34.0 pg   MCHC 32.5 30.0 - 36.0 g/dL   RDW 78.214.5 95.611.5 - 21.315.5 %   Platelets 245 150 - 400 K/uL   nRBC 0.0 0.0 - 0.2 %  Type and screen [facility]   Collection Time: 03/31/18 11:58 PM  Result Value Ref Range   ABO/RH(D) O POS    Antibody Screen NEG    Sample Expiration      04/03/2018 Performed at [facility], [address] [place], [place] [address]     Patient Active Problem List   Diagnosis Date Noted  . Pregnancy 04/01/2018  . Hereditary disease in family possibly affecting fetus, antepartum 11/10/2017  . Fetal abnormality affecting management of mother, antepartum 11/10/2017  . Palpitations 10/16/2017  . Cardiac murmur 10/16/2017  . First degree AV block 10/16/2017  . Supervision of high risk pregnancy, antepartum, second trimester 10/06/2017  . Heart block 10/06/2017  . Unwanted fertility 10/06/2017  . Skin tag of perianal region 12/14/2013  . Depressive disorder, not elsewhere classified 12/14/2013  . Former smoker, stopped smoking in distant past 11/27/2012  . Family history of diabetes in  pregnancy 11/27/2012  . History of facial weakness/Neuro workup/Lesion on MRI w/out change 11/27/2012    Assessment/Plan:  Valerie [name] is a 28 y.o. [phone] at 3744w2d here for spontaneous ROM.  #Labor:  Currently progressing without augmentation. #Pain: None now. Requests IV fentanyl or nitrous oxide when the pain worsens. May or may not want an epidural. #FWB: Category 1 #ID:  GBS negative. On clindamycin for tooth abscess  #MOF: Breast #MOC: BTL #Circ:  n/a  Melanie Mermiges, Student-PA  04/01/2018, 2:26 AM   I spoke with and examined patient and agree with resident/PA-S/MS/SNM's note and plan of care.  Valerie [name] is a 28 y.o. [phone] female at 4544w2d by LMP c/w 5wk u/s, here for SROM/early labor. SROM clear fluid @ 2200. Some uc's. No vb. Good fm.  PNC @ [facility]. Dx w/ 1st degree heart block May and Aug EKG, EKG 1/13 normal. Had normal echo 8/21.  FHR: Cat 1 UCs: q 2-466mins VSS Plan expectant management, monitor for need for augmentation Plans breastfeeding, BTL- consent signed 10/17 Cheral MarkerKimberly R. Zackry Deines, CNM, [facility]-BC 04/01/2018 5:18 AM

## 2018-04-01 NOTE — Anesthesia Preprocedure Evaluation (Signed)
Anesthesia Evaluation  Patient identified by MRN, date of birth, ID band Patient awake    Reviewed: Allergy & Precautions, H&P , NPO status , Patient's Chart, lab work & pertinent test results  History of Anesthesia Complications Negative for: history of anesthetic complications  Airway Mallampati: II  TM Distance: >3 FB Neck ROM: full    Dental no notable dental hx. (+) Teeth Intact   Pulmonary former smoker,    Pulmonary exam normal breath sounds clear to auscultation       Cardiovascular Normal cardiovascular exam+ dysrhythmias + Valvular Problems/Murmurs MR  Rhythm:regular Rate:Normal  1st degree heart block   Neuro/Psych  Headaches, PSYCHIATRIC DISORDERS Anxiety Depression MS  Neuromuscular disease    GI/Hepatic Neg liver ROS, PUD,   Endo/Other  negative endocrine ROS  Renal/GU negative Renal ROS  negative genitourinary   Musculoskeletal   Abdominal (+) + obese,   Peds  Hematology  (+) Blood dyscrasia, anemia ,   Anesthesia Other Findings   Reproductive/Obstetrics Desires sterilization post partum                             Anesthesia Physical  Anesthesia Plan  ASA: III  Anesthesia Plan: Epidural   Post-op Pain Management:    Induction:   PONV Risk Score and Plan: 4 or greater and Ondansetron, Treatment may vary due to age or medical condition and Scopolamine patch - Pre-op  Airway Management Planned: Natural Airway, Nasal Cannula and Simple Face Mask  Additional Equipment:   Intra-op Plan:   Post-operative Plan:   Informed Consent: I have reviewed the patients History and Physical, chart, labs and discussed the procedure including the risks, benefits and alternatives for the proposed anesthesia with the patient or authorized representative who has indicated his/her understanding and acceptance.     Dental advisory given  Plan Discussed with: CRNA and  Surgeon  Anesthesia Plan Comments:         Anesthesia Quick Evaluation

## 2018-04-01 NOTE — Anesthesia Procedure Notes (Signed)
Epidural Patient location during procedure: OB Start time: 04/01/2018 8:00 AM End time: 04/01/2018 8:10 AM  Staffing Anesthesiologist: Leonides GrillsEllender, Ryan P, MD Performed: anesthesiologist   Preanesthetic Checklist Completed: patient identified, site marked, pre-op evaluation, timeout performed, IV checked, risks and benefits discussed and monitors and equipment checked  Epidural Patient position: sitting Prep: DuraPrep Patient monitoring: heart rate, cardiac monitor, continuous pulse ox and blood pressure Approach: midline Location: L4-L5 Injection technique: LOR air  Needle:  Needle type: Tuohy  Needle gauge: 17 G Needle length: 9 cm Needle insertion depth: 5 cm Catheter type: closed end flexible Catheter size: 19 Gauge Catheter at skin depth: 10 cm Test dose: negative and Other  Assessment Events: blood not aspirated, injection not painful, no injection resistance and negative IV test  Additional Notes Informed consent obtained prior to proceeding including risk of failure, 1% risk of PDPH, risk of minor discomfort and bruising. Discussed alternatives to epidural analgesia and patient desires to proceed.  Timeout performed pre-procedure verifying patient name, procedure, and platelet count.  Patient tolerated procedure well. Reason for block:procedure for pain

## 2018-04-01 NOTE — Transfer of Care (Signed)
Immediate Anesthesia Transfer of Care Note  Patient: Valerie [name]  Procedure(s) Performed: POST PARTUM TUBAL LIGATION (N/A Abdomen)  Patient Location: PACU  Anesthesia Type:Epidural  Level of Consciousness: awake, alert  and oriented  Airway & Oxygen Therapy: Patient Spontanous Breathing  Post-op Assessment: Report given to RN and Post -op Vital signs reviewed and stable  Post vital signs: Reviewed and stable  Last Vitals:  Vitals Value Taken Time  BP 111/74 04/01/2018  1:22 PM  Temp    Pulse 83 04/01/2018  1:24 PM  Resp 18 04/01/2018  1:24 PM  SpO2 98 % 04/01/2018  1:24 PM  Vitals shown include unvalidated device data.  Last Pain:  Vitals:   04/01/18 0635  TempSrc:   PainSc: 6       Patients Stated Pain Goal: 2 (04/01/18 2951)  Complications: No apparent anesthesia complications

## 2018-04-01 NOTE — Anesthesia Preprocedure Evaluation (Signed)
Anesthesia Evaluation  Patient identified by MRN, date of birth, ID band Patient awake    Reviewed: Allergy & Precautions, H&P , NPO status , Patient's Chart, lab work & pertinent test results  History of Anesthesia Complications Negative for: history of anesthetic complications  Airway Mallampati: II  TM Distance: >3 FB Neck ROM: full    Dental no notable dental hx. (+) Teeth Intact   Pulmonary former smoker,    Pulmonary exam normal breath sounds clear to auscultation       Cardiovascular Normal cardiovascular exam+ dysrhythmias + Valvular Problems/Murmurs MR  Rhythm:regular Rate:Normal  1st degree heart block   Neuro/Psych  Headaches, PSYCHIATRIC DISORDERS Anxiety Depression MS  Neuromuscular disease    GI/Hepatic Neg liver ROS, PUD,   Endo/Other  negative endocrine ROS  Renal/GU negative Renal ROS  negative genitourinary   Musculoskeletal   Abdominal (+) + obese,   Peds  Hematology  (+) Blood dyscrasia, anemia ,   Anesthesia Other Findings   Reproductive/Obstetrics (+) Pregnancy                             Anesthesia Physical Anesthesia Plan  ASA: III  Anesthesia Plan: Epidural   Post-op Pain Management:    Induction:   PONV Risk Score and Plan:   Airway Management Planned:   Additional Equipment:   Intra-op Plan:   Post-operative Plan:   Informed Consent: I have reviewed the patients History and Physical, chart, labs and discussed the procedure including the risks, benefits and alternatives for the proposed anesthesia with the patient or authorized representative who has indicated his/her understanding and acceptance.       Plan Discussed with:   Anesthesia Plan Comments:         Anesthesia Quick Evaluation

## 2018-04-02 ENCOUNTER — Encounter (HOSPITAL_COMMUNITY): Payer: Self-pay | Admitting: Obstetrics and Gynecology

## 2018-04-02 ENCOUNTER — Encounter (HOSPITAL_COMMUNITY): Payer: Self-pay

## 2018-04-02 MED ORDER — SENNOSIDES-DOCUSATE SODIUM 8.6-50 MG PO TABS: 2.0000 | ORAL_TABLET | Freq: Every evening | ORAL | 0 refills | Status: DC | PRN

## 2018-04-02 MED ORDER — IBUPROFEN 800 MG PO TABS: 800.0000 mg | ORAL_TABLET | Freq: Three times a day (TID) | ORAL | 0 refills | Status: DC | PRN

## 2018-04-02 MED ORDER — OXYCODONE HCL 5 MG PO TABS: 5.0000 mg | ORAL_TABLET | Freq: Four times a day (QID) | ORAL | 0 refills | Status: AC | PRN

## 2018-04-02 NOTE — Plan of Care (Signed)
Pt progressing well, is ambulating in hall and voiding appropriately.  Pain better controlled with ordered oxycodone.

## 2018-04-02 NOTE — Progress Notes (Signed)
MOB was referred for history of depression/anxiety. * Referral screened out by Clinical Social Worker because none of the following criteria appear to apply: ~ History of anxiety/depression during this pregnancy, or of post-partum depression following prior delivery. ~ Diagnosis of anxiety and/or depression within last 3 years OR * MOB's symptoms currently being treated with medication and/or therapy. Please contact the Clinical Social Worker if needs arise, by MOB request, or if MOB scores greater than 9/yes to question 10 on Edinburgh Postpartum Depression Screen.  Carlea Badour Boyd-Gilyard, MSW, LCSW Clinical Social Work [phone]  

## 2018-04-02 NOTE — Anesthesia Postprocedure Evaluation (Signed)
Anesthesia Post Note  Patient: Valerie [name]  Procedure(s) Performed: AN AD HOC LABOR EPIDURAL     Patient location during evaluation: Mother Baby Anesthesia Type: Epidural Level of consciousness: awake, awake and alert and oriented Pain management: pain level controlled Vital Signs Assessment: post-procedure vital signs reviewed and stable Respiratory status: spontaneous breathing and respiratory function stable Cardiovascular status: blood pressure returned to baseline Postop Assessment: no headache, no backache, epidural receding, patient able to bend at knees, no apparent nausea or vomiting, adequate PO intake and able to ambulate Anesthetic complications: no    Last Vitals:  Vitals:   04/02/18 0538 04/02/18 0851  BP: 113/82 116/78  Pulse: 74 75  Resp: 18 18  Temp: 36.7 C 36.7 C  SpO2:  99%    Last Pain:  Vitals:   04/02/18 0851  TempSrc: Oral  PainSc: 2    Pain Goal: Patients Stated Pain Goal: 3 (04/02/18 0851)              @ANFLOW60MIN (12500)  )Cleda Clarks

## 2018-04-02 NOTE — Progress Notes (Signed)
MOB was referred for history of depression/anxiety. * Referral screened out by Clinical Social Worker because none of the following criteria appear to apply: ~ History of anxiety/depression during this pregnancy, or of post-partum depression following prior delivery. ~ Diagnosis of anxiety and/or depression within last 3 years OR * MOB's symptoms currently being treated with medication and/or therapy. Please contact the Clinical Social Worker if needs arise, by MOB request, or if MOB scores greater than 9/yes to question 10 on Edinburgh Postpartum Depression Screen.  Henny Strauch Boyd-Gilyard, MSW, LCSW Clinical Social Work [phone]  

## 2018-04-06 ENCOUNTER — Encounter: Payer: Medicaid Other | Admitting: Advanced Practice Midwife

## 2018-04-07 ENCOUNTER — Inpatient Hospital Stay (HOSPITAL_COMMUNITY): Admission: RE | Admit: 2018-04-07 | Payer: Medicaid Other | Source: Ambulatory Visit

## 2018-04-29 ENCOUNTER — Encounter: Payer: Self-pay | Admitting: Obstetrics and Gynecology

## 2018-04-29 ENCOUNTER — Ambulatory Visit (INDEPENDENT_AMBULATORY_CARE_PROVIDER_SITE_OTHER): Payer: Medicaid Other | Admitting: Obstetrics and Gynecology

## 2018-04-29 DIAGNOSIS — Z1389 Encounter for screening for other disorder: Secondary | ICD-10-CM

## 2018-04-29 DIAGNOSIS — F419 Anxiety disorder, unspecified: Secondary | ICD-10-CM

## 2018-04-29 DIAGNOSIS — O99345 Other mental disorders complicating the puerperium: Secondary | ICD-10-CM

## 2018-04-29 DIAGNOSIS — F53 Postpartum depression: Secondary | ICD-10-CM | POA: Insufficient documentation

## 2018-04-29 DIAGNOSIS — O99340 Other mental disorders complicating pregnancy, unspecified trimester: Secondary | ICD-10-CM

## 2018-04-29 MED ORDER — ESCITALOPRAM OXALATE 10 MG PO TABS: 20.0000 mg | ORAL_TABLET | Freq: Every day | ORAL | 0 refills | Status: DC

## 2018-04-29 NOTE — Patient Instructions (Signed)

## 2018-04-29 NOTE — Progress Notes (Signed)
Post Partum Exam  Valerie [name] is a 28 y.o. [phone] female who presents for a postpartum visit. She is 4 weeks postpartum following a spontaneous vaginal delivery. I have fully reviewed the prenatal and intrapartum course. The delivery was at 39 gestational weeks.  Anesthesia: none. Postpartum course: Patient has c/o mild headache, yeast infection symptoms, and "baby blues" Pt refilled her Lexapro she had previously and has started taking it (20 mg per day). Baby's course has been unremarkable. Baby is feeding by breast. Bleeding no bleeding. Bowel function is normal. Bladder function is normal. Patient is not sexually active. Contraception method is BTL (surgery done 04/01/2018) . Postpartum depression screening:neg (score15)  Pt was started on Lexapro in third trimester by Dr Clearance Coots. Pt has increased to 20 mg daily and feels better. Denies Homo/Suicidal ideations  Possible yeast infection. Self treatment with OTC medication. Sx are better. Declined self swab.   The following portions of the patient's history were reviewed and updated as appropriate: allergies, current medications, past family history, past medical history, past social history, past surgical history and problem list.  Last pap smear done 10/06/2017 and was Normal  Review of Systems Pertinent items noted in HPI and remainder of comprehensive ROS otherwise negative.    Objective:  Last menstrual period 06/30/2017, unknown if currently breastfeeding.  General:  alert   Breasts:  not examined  Lungs: clear to auscultation bilaterally  Heart:  regular rate and rhythm, S1, S2 normal, no murmur, click, rub or gallop  Abdomen: soft, non-tender; bowel sounds normal; no masses,  no organomegaly   Vulva:  not evaluated  Vagina: not evaluated  Cervix:  not evaluated  Corpus: not examined  Adnexa:  not evaluated  Rectal Exam: Not performed.        Assessment:    Nl postpartum exam. Pap smear 09/2017 normal  Postpartum  depression  Plan:   1. Contraception: tubal ligation 2. Pt is doing better with increase in Lexapro. New Rx sent to pharmacy. Pt states will follow up with PCP. Offered appt with [facility], declined. Return to work note provided. Completed Monistat and to call office if Sx do not resolve. 3. Follow up in: 1 yr or as needed.

## 2018-05-06 NOTE — Telephone Encounter (Signed)
Error

## 2018-05-07 ENCOUNTER — Other Ambulatory Visit: Payer: Self-pay

## 2018-05-07 ENCOUNTER — Inpatient Hospital Stay (HOSPITAL_COMMUNITY)
Admission: AD | Admit: 2018-05-07 | Discharge: 2018-05-07 | Disposition: A | Discharge status: Home / Self Care | Payer: Medicaid Other | Attending: Obstetrics and Gynecology | Admitting: Obstetrics and Gynecology

## 2018-05-07 DIAGNOSIS — O9089 Other complications of the puerperium, not elsewhere classified: Secondary | ICD-10-CM | POA: Insufficient documentation

## 2018-05-07 DIAGNOSIS — Z87891 Personal history of nicotine dependence: Secondary | ICD-10-CM | POA: Insufficient documentation

## 2018-05-07 DIAGNOSIS — L7682 Other postprocedural complications of skin and subcutaneous tissue: Secondary | ICD-10-CM

## 2018-05-07 MED ORDER — ESCITALOPRAM OXALATE 20 MG PO TABS: 20.0000 mg | ORAL_TABLET | Freq: Every day | ORAL | 5 refills | Status: DC

## 2018-05-07 NOTE — MAU Provider Note (Signed)
History     CSN: [id number]  Arrival date and time: 05/07/18 1756   First Provider Initiated Contact with Patient 05/07/18 1829      Chief Complaint  Patient presents with  . Wound Check   HPI Valerie [name] is a 28 y.o. [phone] postpartum patient who presents to MAU with chief complaint of "tugging" at the site of her BTL. This is a new problem, onset early this afternoon. Patient reports that she feeds her daughter in cradle position q two hours. She routinely rests her baby across her umbilicus while feeding. Today she states the area feels hard and sore. She is s/p SVD and BTL 04/01/2018. She had a normal postpartum exam 04/29/2018.  Patient denies discharge, warmth to touch, fever, or recent illness. She complains of intermittent diarrhea and that "other people" in her household have had a cold.   OB History    Gravida  4   Para  2   Term  2   Preterm      AB  2   Living  2     SAB  2   TAB      Ectopic      Multiple  0   Live Births  2           Past Medical History:  Diagnosis Date  . Abnormal Pap smear   . Anemia   . Anxiety   . 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 BV (bacterial vaginosis)   . Depression   . Eczema   . First degree heart block   . Gastric ulcer    2000  . Heart murmur   . Infection    UTI, chronic sinus inf  . Migraine   . Mitral valve regurgitation   . Ovarian cyst   . Tricuspid valve regurgitation   . UTI (urinary tract infection)     Past Surgical History:  Procedure Laterality Date  . ADENOIDECTOMY    . COLPOSCOPY    . CRYOTHERAPY    . LEEP    . MYRINGOTOMY WITH TUBE PLACEMENT    . TUBAL LIGATION N/A 04/01/2018   Procedure: POST PARTUM TUBAL LIGATION;  Surgeon: Gwynne Edinger, MD;  Location: [facility];  Service: Gynecology;  Laterality: N/A;    Family History  Problem Relation Age of Onset  . Hypertension Mother   . Diabetes Father   . Hypertension Father   . Cancer  Paternal Aunt        female  . Diabetes Paternal Uncle   . Diabetes Paternal Grandmother   . Hypertension Paternal Grandmother   . Diabetes Paternal Grandfather   . Hypertension Paternal Grandfather     Social History   Tobacco Use  . Smoking status: Former Smoker    Packs/day: 0.25    Types: Cigarettes    Last attempt to quit: 08/05/2017    Years since quitting: 0.7  . Smokeless tobacco: Never Used  Substance Use Topics  . Alcohol use: Never    Frequency: Never  . Drug use: No    Allergies:  Allergies  Allergen Reactions  . Augmentin [Amoxicillin-Pot Clavulanate] Diarrhea and Nausea And Vomiting       . Ciprofloxacin Hives  . Doxycycline Hives  . Hydrocodone Hives  . Prednisone     Had a reaction to high doses and was told not to take again  . Raspberry Swelling    Throat swelling  Medications Prior to Admission  Medication Sig Dispense Refill Last Dose  . acetaminophen (TYLENOL) 325 MG tablet Take 650 mg by mouth every 6 (six) hours as needed for mild pain or headache.   Taking  . escitalopram (LEXAPRO) 20 MG tablet Take 1 tablet (20 mg total) by mouth daily. 30 tablet 5   . Menthol, Topical Analgesic, (BIOFREEZE) 4 % GEL Apply 1 application topically 4 (four) times daily as needed (For back pain.).   Not Taking  . miconazole (MONISTAT 1 COMBINATION PACK) kit Place 1 each vaginally once.   Taking  . Prenatal Vit-Fe Fumarate-FA (PRENATAL MULTIVITAMIN) TABS tablet Take 1 tablet by mouth daily at 12 noon.   Taking    Review of Systems  Constitutional: Negative for fever.  Gastrointestinal:       Pain at BTL sites  Genitourinary: Negative for difficulty urinating.  Musculoskeletal: Negative for back pain.  Neurological: Negative for dizziness, seizures and headaches.  All other systems reviewed and are negative.  Physical Exam   Blood pressure (!) 132/94, pulse 79, temperature 98.3 F (36.8 C), temperature source Oral, resp. rate 18, height '5\' 5"'  (1.651 m),  weight 85.3 kg, currently breastfeeding.  Physical Exam  Nursing note and vitals reviewed. Constitutional: She is oriented to person, place, and time. She appears well-developed and well-nourished.  Respiratory: Effort normal.  GI: Soft. She exhibits no distension. There is no abdominal tenderness. There is no rebound and no guarding.  Musculoskeletal: Normal range of motion.  Neurological: She is alert and oriented to person, place, and time.  Skin: Skin is warm and dry. No rash noted. No erythema. No pallor.  Scant amount of scar tissue palpable beneath incision. Skin is same temperature as surrounding skin No discharge noted    MAU Course  Procedures  --S/p normal exam 04/29/2018 --No concerning findings on physical exam --Discussed that diarrhea is typically self-limiting, encourage PO hydration and hand hygiene  Patient Vitals for the past 24 hrs:  BP Temp Temp src Pulse Resp Height Weight  05/07/18 1826 (!) 132/94 98.3 F (36.8 C) Oral 79 18 '5\' 5"'  (1.651 m) 85.3 kg     Assessment and Plan  --28 y.o. [id number] S/p SVD and BTL 04/01/2018 -- BTL incision healing well, no signs of complications --Signs of worsening acuity reviewed with patient, restated in AVS --Patient encouraged to alternate breastfeeding position  --Discharge home in stable condition  F/U: PRN in clinic  Darlina Rumpf, CNM 05/07/2018, 6:45 PM

## 2018-05-07 NOTE — MAU Note (Signed)
PP 1/15. Had BTL after. Pt stated she felt a pulling sensation on the incision site today. Area feels hard and swollen. Did not look like that  Yesterday. Pt stated she has also had some diarrhea 2 days ago "not sure if the 2 go together.'

## 2018-05-07 NOTE — Discharge Instructions (Signed)
Incision and Drainage, Care After  Refer to this sheet in the next few weeks. These instructions provide you with information about caring for yourself after your procedure. Your health care provider may also give you more specific instructions. Your treatment has been planned according to current medical practices, but problems sometimes occur. Call your health care provider if you have any problems or questions after your procedure.  What can I expect after the procedure?  After the procedure, it is common to have:  · Pain or discomfort around your incision site.  · Drainage from your incision.  Follow these instructions at home:  · Take over-the-counter and prescription medicines only as told by your health care provider.  · If you were prescribed an antibiotic medicine, take it as told by your health care provider. Do not stop taking the antibiotic even if you start to feel better.  · Follow instructions from your health care provider about:  ? How to take care of your incision.  ? When and how you should change your packing and bandage (dressing). Wash your hands with soap and water before you change your dressing. If soap and water are not available, use hand sanitizer.  ? When you should remove your dressing.  · Do not take baths, swim, or use a hot tub until your health care provider approves.  · Keep all follow-up visits as told by your health care provider. This is important.  · Check your incision area every day for signs of infection. Check for:  ? More redness, swelling, or pain.  ? More fluid or blood.  ? Warmth.  ? Pus or a bad smell.  Contact a health care provider if:  · Your cyst or abscess returns.  · You have a fever.  · You have more redness, swelling, or pain around your incision.  · You have more fluid or blood coming from your incision.  · Your incision feels warm to the touch.  · You have pus or a bad smell coming from your incision.  Get help right away if:  · You have severe pain or  bleeding.  · You cannot eat or drink without vomiting.  · You have decreased urine output.  · You become short of breath.  · You have chest pain.  · You cough up blood.  · The area where the incision and drainage occurred becomes numb or it tingles.  This information is not intended to replace advice given to you by your health care provider. Make sure you discuss any questions you have with your health care provider.  Document Released: 05/27/2011 Document Revised: 08/04/2015 Document Reviewed: 12/23/2014  Elsevier Interactive Patient Education © 2019 [employer] Inc[employer]

## 2018-12-27 ENCOUNTER — Encounter (HOSPITAL_COMMUNITY): Payer: Self-pay

## 2018-12-27 ENCOUNTER — Ambulatory Visit (HOSPITAL_COMMUNITY)
Admission: EM | Admit: 2018-12-27 | Discharge: 2018-12-27 | Disposition: A | Discharge status: Home / Self Care | Payer: Medicaid Other | Attending: Family Medicine | Admitting: Family Medicine

## 2018-12-27 ENCOUNTER — Other Ambulatory Visit: Payer: Self-pay

## 2018-12-27 DIAGNOSIS — B349 Viral infection, unspecified: Secondary | ICD-10-CM | POA: Diagnosis present

## 2018-12-27 DIAGNOSIS — Z20828 Contact with and (suspected) exposure to other viral communicable diseases: Secondary | ICD-10-CM | POA: Diagnosis not present

## 2018-12-27 LAB — POCT RAPID STREP A: Streptococcus, Group A Screen (Direct): NEGATIVE

## 2018-12-27 NOTE — Discharge Instructions (Addendum)
This is most likely viral.  We will call you if your COVID results are positive.  I would take precautions until then and quarantine. You can take over-the-counter medications as needed for your symptoms. Make sure you are trying to get some rest and stay hydrated Follow up as needed for continued or worsening symptoms

## 2018-12-27 NOTE — ED Triage Notes (Signed)
Patient presents to Urgent Care with complaints of cough, sore throat, fever, and nasal congestion since two days ago. Patient reports she has not taken anything for her fever which has been as high as 101.8. pt feels like she has strep throat but with COVID going around she would like to be tested for that too.

## 2018-12-27 NOTE — ED Provider Notes (Signed)
[place]    CSN: [id number] Arrival date & time: 12/27/18  1206      History   Chief Complaint Chief Complaint  Patient presents with  . Sore Throat  . Cough  . Fever    HPI Valerie [name] is a 28 y.o. female.    Fever Max temp prior to arrival:  101.8 Temp source:  Subjective Severity:  Moderate Onset quality:  Gradual Duration:  2 days Timing:  Constant Progression:  Waxing and waning Chronicity:  New Relieved by:  Acetaminophen (sudafed and chloraseptic spray) Worsened by:  Nothing Ineffective treatments:  Acetaminophen Associated symptoms: chills, congestion, cough, diarrhea, myalgias, rhinorrhea and sore throat   Associated symptoms: no chest pain, no confusion, no dysuria, no ear pain, no headaches, no nausea, no rash, no somnolence and no vomiting     Past Medical History:  Diagnosis Date  . Abnormal Pap smear   . Anemia   . Anxiety   . 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 BV (bacterial vaginosis)   . Depression   . Eczema   . First degree heart block   . Gastric ulcer    2000  . Heart murmur   . Infection    UTI, chronic sinus inf  . Migraine   . Mitral valve regurgitation   . Ovarian cyst   . Tricuspid valve regurgitation   . UTI (urinary tract infection)     Patient Active Problem List   Diagnosis Date Noted  . Postpartum care following vaginal delivery 04/29/2018  . 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 Hereditary disease in family possibly affecting fetus, antepartum 11/10/2017  . Palpitations 10/16/2017  . Cardiac murmur 10/16/2017  . First degree AV block 10/16/2017  . Unwanted fertility 10/06/2017  . Skin tag of perianal region 12/14/2013  . Depressive disorder, not elsewhere classified 12/14/2013  . Former smoker, stopped smoking in distant past 11/27/2012  . Family history of diabetes in pregnancy 11/27/2012  . History of facial weakness/Neuro workup/Lesion on MRI w/out change 11/27/2012    Past Surgical History:  Procedure Laterality Date  . ADENOIDECTOMY    . COLPOSCOPY    . CRYOTHERAPY    . LEEP    . MYRINGOTOMY WITH TUBE PLACEMENT    . TUBAL LIGATION N/A 04/01/2018   Procedure: POST PARTUM TUBAL LIGATION;  Surgeon: Gwynne Edinger, MD;  Location: [place];  Service: Gynecology;  Laterality: N/A;    OB History    Gravida  4   Para  2   Term  2   Preterm      AB  2   Living  2     SAB  2   TAB      Ectopic      Multiple  0   Live Births  2            Home Medications    Prior to Admission medications   Medication Sig Start Date End Date Taking? Authorizing Provider  acetaminophen (TYLENOL) 325 MG tablet Take 650 mg by mouth every 6 (six) hours as needed for mild pain or headache.    [provider]  escitalopram (LEXAPRO) 20 MG tablet Take 1 tablet (20 mg total) by mouth daily. 05/07/18   Chancy Milroy, MD  Menthol, Topical Analgesic, (BIOFREEZE) 4 % GEL Apply 1 application topically 4 (four) times daily as needed (For back pain.).    [provider]  miconazole (MONISTAT 1 COMBINATION PACK) kit Place 1 each vaginally once.    [provider]  Prenatal Vit-Fe Fumarate-FA (PRENATAL MULTIVITAMIN) TABS tablet Take 1 tablet by mouth daily at 12 noon.    [provider]    Family History Family History  Problem Relation Age of Onset  . Hypertension Mother   . Diabetes Father   . Hypertension Father   . Cancer Paternal Aunt        female  . Diabetes Paternal Uncle   . Diabetes Paternal Grandmother   . Hypertension Paternal Grandmother   . Diabetes Paternal Grandfather   . Hypertension Paternal Grandfather     Social History Social History   Tobacco Use  . Smoking status: Current Every Day Smoker    Packs/day: 1.00    Types: Cigarettes    Last attempt to quit: 08/05/2017    Years since quitting: 1.3  . Smokeless tobacco: Never Used  Substance Use Topics  . Alcohol use: Yes     Frequency: Never    Comment: rarely  . Drug use: No     Allergies   Augmentin [amoxicillin-pot clavulanate], Ciprofloxacin, Doxycycline, Hydrocodone, Prednisone, and Raspberry   Review of Systems Review of Systems  Constitutional: Positive for chills and fever.  HENT: Positive for congestion, rhinorrhea and sore throat. Negative for ear pain.   Respiratory: Positive for cough.   Cardiovascular: Negative for chest pain.  Gastrointestinal: Positive for diarrhea. Negative for nausea and vomiting.  Genitourinary: Negative for dysuria.  Musculoskeletal: Positive for myalgias.  Skin: Negative for rash.  Neurological: Negative for headaches.  Psychiatric/Behavioral: Negative for confusion.     Physical Exam Triage Vital Signs ED Triage Vitals  Enc Vitals Group     BP 12/27/18 1230 127/84     Pulse Rate 12/27/18 1230 95     Resp 12/27/18 1230 17     Temp 12/27/18 1230 98.3 F (36.8 C)     Temp Source 12/27/18 1230 Oral     SpO2 12/27/18 1230 98 %     Weight --      Height --      Head Circumference --      Peak Flow --      Pain Score 12/27/18 1228 4     Pain Loc --      Pain Edu? --      Excl. in [place]? --    No data found.  Updated Vital Signs BP 127/84 (BP Location: Left Arm)   Pulse 95   Temp 98.3 F (36.8 C) (Oral)   Resp 17   SpO2 98%   Visual Acuity Right Eye Distance:   Left Eye Distance:   Bilateral Distance:    Right Eye Near:   Left Eye Near:    Bilateral Near:     Physical Exam Constitutional:      Appearance: She is ill-appearing.  HENT:     Head: Normocephalic and atraumatic.     Right Ear: Tympanic membrane and ear canal normal.     Left Ear: Tympanic membrane and ear canal normal.     Mouth/Throat:     Pharynx: Uvula midline. No posterior oropharyngeal erythema.     Tonsils: No tonsillar exudate. 0 on the right. 0 on the left.  Eyes:     Conjunctiva/sclera: Conjunctivae normal.  Neck:     Musculoskeletal: Normal range of motion.   Cardiovascular:     Rate and Rhythm: Normal rate and regular rhythm.  Pulmonary:  Effort: Pulmonary effort is normal.     Breath sounds: Normal breath sounds.  Skin:    General: Skin is warm and dry.     Findings: No rash.  Neurological:     Mental Status: She is alert.  Psychiatric:        Mood and Affect: Mood normal.      UC Treatments / Results  Labs (all labs ordered are listed, but only abnormal results are displayed) Labs Reviewed  NOVEL CORONAVIRUS, NAA (HOSP ORDER, SEND-OUT TO REF LAB; TAT 18-24 HRS)  CULTURE, GROUP A STREP [facility])  POCT RAPID STREP A    EKG   Radiology No results found.  Procedures Procedures (including critical care time)  Medications Ordered in UC Medications - No data to display  Initial Impression / Assessment and Plan / UC Course  I have reviewed the triage vital signs and the nursing notes.  Pertinent labs & imaging results that were available during my care of the patient were reviewed by me and considered in my medical decision making (see chart for details).     Viral illness- COVID is on the differential. Precautions given Strep test was negative Over-the-counter medications as needed for symptoms. Rest, stay hydrated Follow up as needed for continued or worsening symptoms  Final Clinical Impressions(s) / UC Diagnoses   Final diagnoses:  Viral illness     Discharge Instructions     This is most likely viral.  We will call you if your COVID results are positive.  I would take precautions until then and quarantine. You can take over-the-counter medications as needed for your symptoms. Make sure you are trying to get some rest and stay hydrated Follow up as needed for continued or worsening symptoms     ED Prescriptions    None     PDMP not reviewed this encounter.   Orvan July, NP 12/27/18 1758

## 2018-12-28 LAB — NOVEL CORONAVIRUS, NAA (HOSP ORDER, SEND-OUT TO REF LAB; TAT 18-24 HRS): SARS-CoV-2, NAA: NOT DETECTED

## 2018-12-30 LAB — CULTURE, GROUP A STREP (THRC)

## 2019-01-01 ENCOUNTER — Emergency Department (HOSPITAL_BASED_OUTPATIENT_CLINIC_OR_DEPARTMENT_OTHER)
Admission: EM | Admit: 2019-01-01 | Discharge: 2019-01-02 | Disposition: A | Discharge status: Home / Self Care | Payer: Medicaid Other | Attending: Emergency Medicine | Admitting: Emergency Medicine

## 2019-01-01 ENCOUNTER — Other Ambulatory Visit: Payer: Self-pay

## 2019-01-01 ENCOUNTER — Emergency Department (HOSPITAL_BASED_OUTPATIENT_CLINIC_OR_DEPARTMENT_OTHER): Payer: Medicaid Other

## 2019-01-01 ENCOUNTER — Encounter (HOSPITAL_BASED_OUTPATIENT_CLINIC_OR_DEPARTMENT_OTHER): Payer: Self-pay

## 2019-01-01 DIAGNOSIS — J209 Acute bronchitis, unspecified: Secondary | ICD-10-CM | POA: Diagnosis not present

## 2019-01-01 DIAGNOSIS — R05 Cough: Secondary | ICD-10-CM

## 2019-01-01 DIAGNOSIS — R0981 Nasal congestion: Secondary | ICD-10-CM | POA: Diagnosis present

## 2019-01-01 DIAGNOSIS — Z79899 Other long term (current) drug therapy: Secondary | ICD-10-CM | POA: Insufficient documentation

## 2019-01-01 DIAGNOSIS — F17210 Nicotine dependence, cigarettes, uncomplicated: Secondary | ICD-10-CM | POA: Diagnosis not present

## 2019-01-01 DIAGNOSIS — Z20828 Contact with and (suspected) exposure to other viral communicable diseases: Secondary | ICD-10-CM | POA: Insufficient documentation

## 2019-01-01 DIAGNOSIS — R059 Cough, unspecified: Secondary | ICD-10-CM

## 2019-01-01 LAB — URINALYSIS, ROUTINE W REFLEX MICROSCOPIC
Bilirubin Urine: NEGATIVE
Glucose, UA: NEGATIVE mg/dL
Hgb urine dipstick: NEGATIVE
Ketones, ur: NEGATIVE mg/dL
Leukocytes,Ua: NEGATIVE
Nitrite: NEGATIVE
Protein, ur: NEGATIVE mg/dL
Specific Gravity, Urine: >1.03 — ABNORMAL HIGH (ref 1.005–1.030)
pH: 5.5 (ref 5.0–8.0)

## 2019-01-01 LAB — PREGNANCY, URINE: Preg Test, Ur: NEGATIVE

## 2019-01-01 NOTE — Progress Notes (Signed)
Patient given spacer for  Use with MDI.  Patient is familiar with use, all questions answered.

## 2019-01-01 NOTE — ED Provider Notes (Signed)
[facility] Provider Note: Georgena Spurling, MD, FACEP  CSN: [id number] MRN: [id number] ARRIVAL: 01/01/19 at 2203 ROOM: [place]  Viral Like Illness   HISTORY OF PRESENT ILLNESS  01/01/19 10:55 PM Valerie [name] is a 28 y.o. female who began feeling sick 13 days ago.  Her illness began with nasal congestion and cough.  About 5 days into her illness she developed body aches, chills, worsening cough, shortness of breath and altered taste and smell.  She was seen at an urgent care on December 27, 2018 and had a negative Covid test.  She [facility] later but has not gotten the results ("if you do not hear from [place] as good as").  She is here with persistent cough and shortness of breath not relieved by the albuterol inhaler she was given.  She has been using her son's nebulizer machine without adequate relief either.  She was also placed on Zithromax without relief.  Symptoms are moderate.  She is also having stress incontinence which she attributes to coughing and is now having some suprapubic pain and is concerned she may have a urinary tract infection.   Past Medical History:  Diagnosis Date  . Abnormal Pap smear   . Anemia   . Anxiety   . 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 BV (bacterial vaginosis)   . Depression   . Eczema   . First degree heart block   . Gastric ulcer    2000  . Heart murmur   . Infection    UTI, chronic sinus inf  . Migraine   . Mitral valve regurgitation   . Ovarian cyst   . Tricuspid valve regurgitation   . UTI (urinary tract infection)     Past Surgical History:  Procedure Laterality Date  . ADENOIDECTOMY    . COLPOSCOPY    . CRYOTHERAPY    . LEEP    . MYRINGOTOMY WITH TUBE PLACEMENT    . TUBAL LIGATION N/A 04/01/2018   Procedure: POST PARTUM TUBAL LIGATION;  Surgeon: Gwynne Edinger, MD;  Location: [facility];  Service: Gynecology;  Laterality: N/A;     Family History  Problem Relation Age of Onset  . Hypertension Mother   . Diabetes Father   . Hypertension Father   . Cancer Paternal Aunt        female  . Diabetes Paternal Uncle   . Diabetes Paternal Grandmother   . Hypertension Paternal Grandmother   . Diabetes Paternal Grandfather   . Hypertension Paternal Grandfather     Social History   Tobacco Use  . Smoking status: Current Every Day Smoker    Packs/day: 1.00    Types: Cigarettes    Last attempt to quit: 08/05/2017    Years since quitting: 1.4  . Smokeless tobacco: Never Used  Substance Use Topics  . Alcohol use: Yes    Frequency: Never    Comment: rarely  . Drug use: No    Prior to Admission medications   Medication Sig Start Date End Date Taking? Authorizing Provider  acetaminophen (TYLENOL) 325 MG tablet Take 650 mg by mouth every 6 (six) hours as needed for mild pain or headache.    [provider]  escitalopram (LEXAPRO) 20 MG tablet Take 1 tablet (20 mg total) by mouth daily. 05/07/18   Chancy Milroy, MD  Menthol, Topical Analgesic, (BIOFREEZE) 4 %  GEL Apply 1 application topically 4 (four) times daily as needed (For back pain.).    [provider]  miconazole (MONISTAT 1 COMBINATION PACK) kit Place 1 each vaginally once.    [provider]  Prenatal Vit-Fe Fumarate-FA (PRENATAL MULTIVITAMIN) TABS tablet Take 1 tablet by mouth daily at 12 noon.    [provider]    Allergies Augmentin [amoxicillin-pot clavulanate], Ciprofloxacin, Doxycycline, Hydrocodone, Prednisone, and Raspberry   REVIEW OF SYSTEMS  Negative except as noted here or in the History of Present Illness.   PHYSICAL EXAMINATION  Initial Vital Signs Blood pressure 127/86, pulse 92, temperature 97.7 F (36.5 C), temperature source Oral, resp. rate 20, height _0  (1.651 m), weight 97.1 kg, last menstrual period 12/24/2018, SpO2 98 %, currently breastfeeding.  Examination General: Well-developed,  well-nourished female in no acute distress; appearance consistent with age of record HENT: normocephalic; atraumatic Eyes: pupils equal, round and reactive to light; extraocular muscles intact Neck: supple Heart: regular rate and rhythm Lungs: inspiratory and expiratory wheezes Abdomen: soft; nondistended; mild suprapubic tenderness; bowel sounds present Extremities: No deformity; full range of motion; pulses normal Neurologic: Awake, alert and oriented; motor function intact in all extremities and symmetric; no facial droop Skin: Warm and dry Psychiatric: Normal mood and affect   RESULTS  Summary of this visit's results, reviewed by myself:   EKG Interpretation  Date/Time:  Friday January 01 2019 22:35:16 EDT Ventricular Rate:  81 PR Interval:    QRS Duration: 88 QT Interval:  362 QTC Calculation: 421 R Axis:   65 Text Interpretation:  Sinus rhythm Prolonged PR interval Borderline T abnormalities, anterior leads similar to prior EKG Confirmed by Malvin Johns [phone]) on 01/01/2019 10:44:14 PM      Laboratory Studies: Results for orders placed or performed during the hospital encounter of 01/01/19 (from the past 24 hour(s))  Urinalysis, Routine w reflex microscopic     Status: Abnormal   Collection Time: 01/01/19 11:15 PM  Result Value Ref Range   Color, Urine YELLOW YELLOW   APPearance CLEAR CLEAR   Specific Gravity, Urine >1.030 (H) 1.005 - 1.030   pH 5.5 5.0 - 8.0   Glucose, UA NEGATIVE NEGATIVE mg/dL   Hgb urine dipstick NEGATIVE NEGATIVE   Bilirubin Urine NEGATIVE NEGATIVE   Ketones, ur NEGATIVE NEGATIVE mg/dL   Protein, ur NEGATIVE NEGATIVE mg/dL   Nitrite NEGATIVE NEGATIVE   Leukocytes,Ua NEGATIVE NEGATIVE  Pregnancy, urine     Status: None   Collection Time: 01/01/19 11:15 PM  Result Value Ref Range   Preg Test, Ur NEGATIVE NEGATIVE   Imaging Studies: Dg Chest Port 1 View  Result Date: 01/01/2019 CLINICAL DATA:  28 year old female with cough, fever, and  congestion. EXAM: PORTABLE CHEST 1 VIEW COMPARISON:  None. FINDINGS: The heart size and mediastinal contours are within normal limits. Both lungs are clear. The visualized skeletal structures are unremarkable. IMPRESSION: No active disease. Electronically Signed   By: Anner Crete M.D.   On: 01/01/2019 23:06    ED COURSE and MDM  Nursing notes and initial vitals signs, including pulse oximetry, reviewed.  Vitals:   01/01/19 2217 01/01/19 2219  BP: 127/86   Pulse: 92   Resp: 20   Temp: 97.7 F (36.5 C)   TempSrc: Oral   SpO2: 98%   Weight:  97.1 kg  Height:  _1  (1.651 m)   Patient given an AeroChamber for her inhaler and advised on proper use of her inhaler.  She had only been  using 1 puff every 4 hours without any AeroChamber.  We will give her a dose of dexamethasone to help reduce bronchospasm.  COVID-19 test is pending.  Valerie [name] was evaluated in Emergency Department on 01/02/2019 for the symptoms described in the history of present illness. She was evaluated in the context of the global COVID-19 pandemic, which necessitated consideration that the patient might be at risk for infection with the SARS-CoV-2 virus that causes COVID-19. Institutional protocols and algorithms that pertain to the evaluation of patients at risk for COVID-19 are in a state of rapid change based on information released by regulatory bodies including the CDC and federal and state organizations. These policies and algorithms were followed during the patient's care in the ED.   PROCEDURES    ED DIAGNOSES     ICD-10-CM   1. Acute bronchitis with bronchospasm  J20.9        Anelia Carriveau, Jenny Reichmann, MD 01/02/19 [phone]

## 2019-01-01 NOTE — ED Triage Notes (Signed)
Pt started having allergy symptoms 2 weeks ago. 1 Week ago pt started having body aches, chills, congested cough, altered taste and smell. Pt had a negative COVID test at that time. Pt was started on Z-Pak with no improvement. Pt reports worsening symptoms and has been taking nebulizer treatments to help with breathing.

## 2019-01-02 LAB — SARS CORONAVIRUS 2 (TAT 6-24 HRS): SARS Coronavirus 2: NEGATIVE

## 2019-01-02 MED ORDER — DEXAMETHASONE SODIUM PHOSPHATE 10 MG/ML IJ SOLN
10.0000 mg | Freq: Once | INTRAMUSCULAR | Status: AC
  Administered 2019-01-02: 10 mg via INTRAMUSCULAR
  Filled 2019-01-02: qty 1

## 2019-01-03 LAB — URINE CULTURE: Culture: 10000 — AB

## 2019-01-30 ENCOUNTER — Emergency Department (HOSPITAL_BASED_OUTPATIENT_CLINIC_OR_DEPARTMENT_OTHER)
Admission: EM | Admit: 2019-01-30 | Discharge: 2019-01-31 | Disposition: A | Discharge status: Home / Self Care | Payer: Medicaid Other | Attending: Emergency Medicine | Admitting: Emergency Medicine

## 2019-01-30 ENCOUNTER — Encounter (HOSPITAL_BASED_OUTPATIENT_CLINIC_OR_DEPARTMENT_OTHER): Payer: Self-pay | Admitting: Adult Health

## 2019-01-30 ENCOUNTER — Other Ambulatory Visit: Payer: Self-pay

## 2019-01-30 DIAGNOSIS — F17210 Nicotine dependence, cigarettes, uncomplicated: Secondary | ICD-10-CM | POA: Insufficient documentation

## 2019-01-30 DIAGNOSIS — F419 Anxiety disorder, unspecified: Secondary | ICD-10-CM | POA: Diagnosis not present

## 2019-01-30 DIAGNOSIS — R42 Dizziness and giddiness: Secondary | ICD-10-CM | POA: Diagnosis not present

## 2019-01-30 DIAGNOSIS — N939 Abnormal uterine and vaginal bleeding, unspecified: Secondary | ICD-10-CM

## 2019-01-30 LAB — CBC WITH DIFFERENTIAL/PLATELET
Abs Immature Granulocytes: 0.01 10*3/uL (ref 0.00–0.07)
Basophils Absolute: 0 10*3/uL (ref 0.0–0.1)
Basophils Relative: 1 %
Eosinophils Absolute: 0.3 10*3/uL (ref 0.0–0.5)
Eosinophils Relative: 3 %
HCT: 39.2 % (ref 36.0–46.0)
Hemoglobin: 12.7 g/dL (ref 12.0–15.0)
Immature Granulocytes: 0 %
Lymphocytes Relative: 37 %
Lymphs Abs: 2.9 10*3/uL (ref 0.7–4.0)
MCH: 30.6 pg (ref 26.0–34.0)
MCHC: 32.4 g/dL (ref 30.0–36.0)
MCV: 94.5 fL (ref 80.0–100.0)
Monocytes Absolute: 0.6 10*3/uL (ref 0.1–1.0)
Monocytes Relative: 8 %
Neutro Abs: 4 10*3/uL (ref 1.7–7.7)
Neutrophils Relative %: 51 %
Platelets: 231 10*3/uL (ref 150–400)
RBC: 4.15 MIL/uL (ref 3.87–5.11)
RDW: 13.6 % (ref 11.5–15.5)
WBC: 7.7 10*3/uL (ref 4.0–10.5)
nRBC: 0 % (ref 0.0–0.2)

## 2019-01-30 LAB — PREGNANCY, URINE: Preg Test, Ur: NEGATIVE

## 2019-01-30 MED ORDER — SODIUM CHLORIDE 0.9 % IV BOLUS
1000.0000 mL | Freq: Once | INTRAVENOUS | Status: AC
  Administered 2019-01-30: 1000 mL via INTRAVENOUS

## 2019-01-30 NOTE — ED Triage Notes (Signed)
Valerie [name] presents with vaginal bleeding that began Wednesday and began getting heavier throughout Thursday. She is now going through a 66ml menstrual cup and super absorbency pads every hour and thirty minutes. She endorses dizziness with change of position, especially standing. The symptoms are associated with lower back pain. She had her tubes tied and does not believe she is pregnant. She has not ever had anything like this before. GYN is [employer].

## 2019-01-30 NOTE — ED Notes (Signed)
Attempted IV x 2 unsuccessful.

## 2019-01-30 NOTE — Discharge Instructions (Addendum)
As discussed, your evaluation today has been largely reassuring.  But, it is important that you monitor your condition carefully, and do not hesitate to return to the ED if you develop new, or concerning changes in your condition. ? ?Otherwise, please follow-up with your physician for appropriate ongoing care. ? ?

## 2019-01-30 NOTE — ED Provider Notes (Signed)
[facility] EMERGENCY DEPARTMENT Provider Note   CSN: [id number] Arrival date & time: 01/30/19  1956     History   Chief Complaint Chief Complaint  Patient presents with  . Vaginal Bleeding    HPI Valerie [name] is a 28 y.o. female.     HPI Patient presents with concern of vaginal bleeding, lightheadedness. Patient notes that her last menstrual period was 1 month ago, she has had tubal ligation. Over the past 4 days patient has had bleeding, more severe yesterday and the day prior, slightly better today, but today, after an episode of lightheadedness, with near syncope, she presents for evaluation. She has some crampy abdominal pain, no chest pain, no other abdominal pain, no fever. She notes that she is generally well, though she has a history of influenza-like illness last month. Past Medical History:  Diagnosis Date  . Abnormal Pap smear   . Anemia   . Anxiety   . 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 BV (bacterial vaginosis)   . Depression   . Eczema   . First degree heart block   . Gastric ulcer    2000  . Heart murmur   . Infection    UTI, chronic sinus inf  . Migraine   . Mitral valve regurgitation   . Ovarian cyst   . Tricuspid valve regurgitation   . UTI (urinary tract infection)     Patient Active Problem List   Diagnosis Date Noted  . Postpartum care following vaginal delivery 04/29/2018  . 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 Hereditary disease in family possibly affecting fetus, antepartum 11/10/2017  . Palpitations 10/16/2017  . Cardiac murmur 10/16/2017  . First degree AV block 10/16/2017  . Unwanted fertility 10/06/2017  . Skin tag of perianal region 12/14/2013  . Depressive disorder, not elsewhere classified 12/14/2013  . Former smoker, stopped smoking in distant past 11/27/2012  . Family history of diabetes in pregnancy 11/27/2012  . History of facial weakness/Neuro workup/Lesion on MRI w/out change  11/27/2012    Past Surgical History:  Procedure Laterality Date  . ADENOIDECTOMY    . COLPOSCOPY    . CRYOTHERAPY    . LEEP    . MYRINGOTOMY WITH TUBE PLACEMENT    . TUBAL LIGATION N/A 04/01/2018   Procedure: POST PARTUM TUBAL LIGATION;  Surgeon: Kathrynn RunningWouk, Noah Bedford, MD;  [facility];  Service: Gynecology;  Laterality: N/A;     OB History    Gravida  4   Para  2   Term  2   Preterm      AB  2   Living  2     SAB  2   TAB      Ectopic      Multiple  0   Live Births  2            Home Medications    Prior to Admission medications   Medication Sig Start Date End Date Taking? Authorizing Provider  escitalopram (LEXAPRO) 20 MG tablet Take 1 tablet (20 mg total) by mouth daily. 05/07/18   Hermina StaggersErvin, Michael L, MD  Menthol, Topical Analgesic, (BIOFREEZE) 4 % GEL Apply 1 application topically 4 (four) times daily as needed (For back pain.).    [provider]  Prenatal Vit-Fe Fumarate-FA (PRENATAL MULTIVITAMIN) TABS tablet Take 1 tablet by mouth daily at 12 noon.    [provider]    Family History Family  History  Problem Relation Age of Onset  . Hypertension Mother   . Diabetes Father   . Hypertension Father   . Cancer Paternal Aunt        female  . Diabetes Paternal Uncle   . Diabetes Paternal Grandmother   . Hypertension Paternal Grandmother   . Diabetes Paternal Grandfather   . Hypertension Paternal Grandfather     Social History Social History   Tobacco Use  . Smoking status: Current Every Day Smoker    Packs/day: 1.00    Types: Cigarettes    Last attempt to quit: 08/05/2017    Years since quitting: 1.4  . Smokeless tobacco: Never Used  Substance Use Topics  . Alcohol use: Yes    Frequency: Never    Comment: rarely  . Drug use: No     Allergies   Augmentin [amoxicillin-pot clavulanate], Ciprofloxacin, Doxycycline, Hydrocodone, Prednisone, and Raspberry   Review of Systems Review of Systems   Constitutional:       Per HPI, otherwise negative  HENT:       Per HPI, otherwise negative  Respiratory:       Per HPI, otherwise negative  Cardiovascular:       Per HPI, otherwise negative  Gastrointestinal: Negative for vomiting.  Endocrine:       Negative aside from HPI  Genitourinary:       Neg aside from HPI   Musculoskeletal:       Per HPI, otherwise negative  Skin: Negative.   Neurological: Positive for light-headedness. Negative for syncope.     Physical Exam Updated Vital Signs BP 103/63   Pulse 83   Temp 98.1 F (36.7 C) (Oral)   Resp 19   Ht 5\' 5"  (1.651 m)   Wt 95.3 kg   LMP 01/28/2019 (Exact Date)   SpO2 99%   BMI 34.95 kg/m   Physical Exam Vitals signs and nursing note reviewed.  Constitutional:      General: She is not in acute distress.    Appearance: She is well-developed. She is obese. She is not ill-appearing or diaphoretic.  HENT:     Head: Normocephalic and atraumatic.  Eyes:     Conjunctiva/sclera: Conjunctivae normal.  Cardiovascular:     Rate and Rhythm: Normal rate and regular rhythm.  Pulmonary:     Effort: Pulmonary effort is normal. No respiratory distress.     Breath sounds: Normal breath sounds. No stridor.  Abdominal:     General: There is no distension.  Genitourinary:   Skin:    General: Skin is warm and dry.  Neurological:     Mental Status: She is alert and oriented to person, place, and time.     Cranial Nerves: No cranial nerve deficit.      ED Treatments / Results  Labs (all labs ordered are listed, but only abnormal results are displayed) Labs Reviewed  PREGNANCY, URINE  CBC WITH DIFFERENTIAL/PLATELET    Procedures Procedures (including critical care time)  Medications Ordered in ED Medications  sodium chloride 0.9 % bolus 1,000 mL (1,000 mLs Intravenous New Bag/Given 01/30/19 2157)     Initial Impression / Assessment and Plan / ED Course  I have reviewed the triage vital signs and the nursing  notes.  Pertinent labs & imaging results that were available during my care of the patient were reviewed by me and considered in my medical decision making (see chart for details).        10:38 PM Patient in no  distress, awake, alert. Hemoglobin unremarkable, pregnancy test negative. We discussed all findings again, and with reassuring hemoglobin value, though the patient admits to lightheadedness, given her description of bleeding that is actually better than it was 2 days ago, there is low suspicion for worsening of her condition, some suspicion for symptomatic bleeding secondary to her menstrual cycle.  Absent chest pain, cardiac disease history, no indication for additional cardiac evaluation. Patient encouraged to stay well-hydrated, follow-up with her gynecologist in 2 days, stands fully, and return precautions provided.  Patient discharged in stable condition.  Final Clinical Impressions(s) / ED Diagnoses   Final diagnoses:  Vaginal bleeding  Lightheadedness     Carmin Muskrat, MD 01/30/19 2240

## 2019-02-04 ENCOUNTER — Other Ambulatory Visit: Payer: Self-pay

## 2019-02-04 DIAGNOSIS — Z20822 Contact with and (suspected) exposure to covid-19: Secondary | ICD-10-CM

## 2019-02-06 LAB — NOVEL CORONAVIRUS, NAA: SARS-CoV-2, NAA: NOT DETECTED

## 2019-03-04 DIAGNOSIS — R112 Nausea with vomiting, unspecified: Secondary | ICD-10-CM | POA: Diagnosis not present

## 2019-03-04 DIAGNOSIS — R197 Diarrhea, unspecified: Secondary | ICD-10-CM | POA: Diagnosis not present

## 2019-03-06 DIAGNOSIS — R112 Nausea with vomiting, unspecified: Secondary | ICD-10-CM | POA: Diagnosis not present

## 2019-03-25 ENCOUNTER — Ambulatory Visit: Payer: Medicaid Other | Attending: Internal Medicine

## 2019-03-25 DIAGNOSIS — R030 Elevated blood-pressure reading, without diagnosis of hypertension: Secondary | ICD-10-CM | POA: Diagnosis not present

## 2019-03-25 DIAGNOSIS — L299 Pruritus, unspecified: Secondary | ICD-10-CM | POA: Diagnosis not present

## 2019-03-25 DIAGNOSIS — F17200 Nicotine dependence, unspecified, uncomplicated: Secondary | ICD-10-CM | POA: Diagnosis not present

## 2019-03-25 DIAGNOSIS — Z20822 Contact with and (suspected) exposure to covid-19: Secondary | ICD-10-CM

## 2019-03-25 DIAGNOSIS — U071 COVID-19: Secondary | ICD-10-CM | POA: Diagnosis not present

## 2019-03-27 LAB — NOVEL CORONAVIRUS, NAA: SARS-CoV-2, NAA: NOT DETECTED

## 2019-04-01 ENCOUNTER — Encounter: Payer: Self-pay | Admitting: Cardiology

## 2019-04-01 ENCOUNTER — Ambulatory Visit (INDEPENDENT_AMBULATORY_CARE_PROVIDER_SITE_OTHER): Payer: Medicaid Other | Admitting: Cardiology

## 2019-04-01 ENCOUNTER — Other Ambulatory Visit: Payer: Self-pay

## 2019-04-01 VITALS — BP 112/74 | HR 87 | Ht 65.0 in | Wt 217.0 lb

## 2019-04-01 DIAGNOSIS — R002 Palpitations: Secondary | ICD-10-CM | POA: Diagnosis not present

## 2019-04-01 DIAGNOSIS — F17200 Nicotine dependence, unspecified, uncomplicated: Secondary | ICD-10-CM | POA: Diagnosis not present

## 2019-04-01 MED ORDER — METOPROLOL TARTRATE 25 MG PO TABS: 25.0000 mg | ORAL_TABLET | Freq: Every day | ORAL | 3 refills | Status: DC

## 2019-04-01 NOTE — Progress Notes (Signed)
Cardiology Office Note:    Date:  04/01/2019   ID:  Valerie [name], DOB [birth year], MRN [id number]  PCP:  Aretta Nip, MD  Cardiologist:  Jenean Lindau, MD   Referring MD: Aretta Nip, MD    ASSESSMENT:    1. Palpitations    PLAN:    In order of problems listed above:  1. Palpitations: I reassured the patient about my findings.  We will check her TSH today.  I will initiate her on metoprolol tartrate 25 mg in the morning daily.  She will keep a track of her heart rate and blood pressure.  Adequate hydration was emphasized.   Medication Adjustments/Labs and Tests Ordered: Current medicines are reviewed at length with the patient today.  Concerns regarding medicines are outlined above.  No orders of the defined types were placed in this encounter.  No orders of the defined types were placed in this encounter.    No chief complaint on file.    History of Present Illness:    Valerie [name] is a 29 y.o. female.  Patient was evaluated by me in the past for palpitations.  She tells me that she has been palpitation free for a long time but this morning she had an episode of fast heartbeat.  She tells me that her heart rate was 97.  No chest pain orthopnea or PND.  At the time of my evaluation, the patient is alert awake oriented and in no distress.  She tries to walk on a regular basis.  She tells me that she has had tubal ligation and does not plan any pregnancy from now on.  Past Medical History:  Diagnosis Date  . Abnormal Pap smear   . Anemia   . Anxiety   . 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 BV (bacterial vaginosis)   . Depression   . Eczema   . First degree heart block   . Gastric ulcer    2000  . Heart murmur   . Infection    UTI, chronic sinus inf  . Migraine   . Mitral valve regurgitation   . Ovarian cyst   . Tricuspid valve regurgitation   . UTI (urinary tract infection)     Past Surgical History:  Procedure  Laterality Date  . ADENOIDECTOMY    . COLPOSCOPY    . CRYOTHERAPY    . LEEP    . MYRINGOTOMY WITH TUBE PLACEMENT    . TUBAL LIGATION N/A 04/01/2018   Procedure: POST PARTUM TUBAL LIGATION;  Surgeon: Gwynne Edinger, MD;  Location: [place];  Service: Gynecology;  Laterality: N/A;    Current Medications: Current Meds  Medication Sig  . Menthol, Topical Analgesic, (BIOFREEZE) 4 % GEL Apply 1 application topically 4 (four) times daily as needed (For back pain.).  Marland Kitchen Prenatal Vit-Fe Fumarate-FA (PRENATAL MULTIVITAMIN) TABS tablet Take 1 tablet by mouth daily at 12 noon.     Allergies:   Augmentin [amoxicillin-pot clavulanate], Ciprofloxacin, Doxycycline, Hydrocodone, Prednisone, and Raspberry   Social History   Socioeconomic History  . Marital status: Married    Spouse name: Not on file  . Number of children: Not on file  . Years of education: Not on file  . Highest education level: Not on file  Occupational History  . Not on file  Tobacco Use  . Smoking status: Current Every Day Smoker    Packs/day: 1.00    Types:  Cigarettes    Last attempt to quit: 08/05/2017    Years since quitting: 1.6  . Smokeless tobacco: Never Used  Substance and Sexual Activity  . Alcohol use: Yes    Comment: rarely  . Drug use: No  . Sexual activity: Yes    Partners: Male    Birth control/protection: None  Other Topics Concern  . Not on file  Social History Narrative  . Not on file   Social Determinants of Health   Financial Resource Strain:   . Difficulty of Paying Living Expenses: Not on file  Food Insecurity:   . Worried About Programme researcher, broadcasting/film/video in the Last Year: Not on file  . Ran Out of Food in the Last Year: Not on file  Transportation Needs:   . Lack of Transportation (Medical): Not on file  . Lack of Transportation (Non-Medical): Not on file  Physical Activity:   . Days of Exercise per Week: Not on file  . Minutes of Exercise per Session: Not on file  Stress:   .  Feeling of Stress : Not on file  Social Connections:   . Frequency of Communication with Friends and Family: Not on file  . Frequency of Social Gatherings with Friends and Family: Not on file  . Attends Religious Services: Not on file  . Active Member of Clubs or Organizations: Not on file  . Attends Banker Meetings: Not on file  . Marital Status: Not on file     Family History: The patient's family history includes Cancer in her paternal aunt; Diabetes in her father, paternal grandfather, paternal grandmother, and paternal uncle; Hypertension in her father, mother, paternal grandfather, and paternal grandmother.  ROS:   Please see the history of present illness.    All other systems reviewed and are negative.  EKGs/Labs/Other Studies Reviewed:    The following studies were reviewed today: This patient has been under my care in my previous practice.  He is here now to transfer his care and be established with my current practice.  Echo reports were reviewed with the patient and monitor reports also   Recent Labs: 01/30/2019: Hemoglobin 12.7; Platelets 231  Recent Lipid Panel No results found for: CHOL, TRIG, HDL, CHOLHDL, VLDL, LDLCALC, LDLDIRECT  Physical Exam:    VS:  BP 112/74   Pulse 87   Ht 5\' 5"  (1.651 m)   Wt 217 lb (98.4 kg)   SpO2 97%   BMI 36.11 kg/m     Wt Readings from Last 3 Encounters:  04/01/19 217 lb (98.4 kg)  01/30/19 210 lb (95.3 kg)  01/01/19 214 lb (97.1 kg)     GEN: Patient is in no acute distress HEENT: Normal NECK: No JVD; No carotid bruits LYMPHATICS: No lymphadenopathy CARDIAC: Hear sounds regular, 2/6 systolic murmur at the apex. RESPIRATORY:  Clear to auscultation without rales, wheezing or rhonchi  ABDOMEN: Soft, non-tender, non-distended MUSCULOSKELETAL:  No edema; No deformity  SKIN: Warm and dry NEUROLOGIC:  Alert and oriented x 3 PSYCHIATRIC:  Normal affect   Signed, 01/03/19, MD  04/01/2019 3:24 PM      [facility]

## 2019-04-01 NOTE — Patient Instructions (Signed)
Medication Instructions:  Your physician has recommended you make the following change in your medication:  START taking metoprolol 25 mg (1tablet) once daily  *If you need a refill on your cardiac medications before your next appointment, please call your pharmacy*  Lab Work: Your physician recommends that you have a TSH drawn today  If you have labs (blood work) drawn today and your tests are completely normal, you will receive your results only by: Marland Kitchen MyChart Message (if you have MyChart) OR . A paper copy in the mail If you have any lab test that is abnormal or we need to change your treatment, we will call you to review the results.  Testing/Procedures: NONE  Follow-Up: At [facility], you and your health needs are our priority.  As part of our continuing mission to provide you with exceptional heart care, we have created designated Provider Care Teams.  These Care Teams include your primary Cardiologist (physician) and Advanced Practice Providers (APPs -  Physician Assistants and Nurse Practitioners) who all work together to provide you with the care you need, when you need it.  Your next appointment:   3 month(s)  The format for your next appointment:   In Person  Provider:   Belva Crome, MD  Other Instructions Metoprolol Tablets What is this medicine? METOPROLOL (me TOE proe lole) is a beta blocker. It decreases the amount of work your heart has to do and helps your heart beat regularly. It is used to treat high blood pressure and/or prevent chest pain (also called angina). It is also used after a heart attack to prevent a second one. This medicine may be used for other purposes; ask your health care provider or pharmacist if you have questions. COMMON BRAND NAME(S): Lopressor What should I tell my health care provider before I take this medicine? They need to know if you have any of these conditions:  diabetes  heart or vessel disease like slow heart rate,  worsening heart failure, heart block, sick sinus syndrome or Raynaud's disease  kidney disease  liver disease  lung or breathing disease, like asthma or emphysema  pheochromocytoma  thyroid disease  an unusual or allergic reaction to metoprolol, other beta-blockers, medicines, foods, dyes, or preservatives  pregnant or trying to get pregnant  breast-feeding How should I use this medicine? Take this drug by mouth with water. Take it as directed on the prescription label at the same time every day. You can take it with or without food. You should always take it the same way. Keep taking it unless your health care provider tells you to stop. Talk to your health care provider about the use of this drug in children. Special care may be needed. Overdosage: If you think you have taken too much of this medicine contact a poison control center or emergency room at once. NOTE: This medicine is only for you. Do not share this medicine with others. What if I miss a dose? If you miss a dose, take it as soon as you can. If it is almost time for your next dose, take only that dose. Do not take double or extra doses. What may interact with this medicine? This medicine may interact with the following medications:  certain medicines for blood pressure, heart disease, irregular heart beat  certain medicines for depression like monoamine oxidase (MAO) inhibitors, fluoxetine, or paroxetine  clonidine  dobutamine  epinephrine  isoproterenol  reserpine This list may not describe all possible interactions. Give your health  care provider a list of all the medicines, herbs, non-prescription drugs, or dietary supplements you use. Also tell them if you smoke, drink alcohol, or use illegal drugs. Some items may interact with your medicine. What should I watch for while using this medicine? Visit your doctor or health care professional for regular check ups. Contact your doctor right away if your  symptoms worsen. Check your blood pressure and pulse rate regularly. Ask your health care professional what your blood pressure and pulse rate should be, and when you should contact them. You may get drowsy or dizzy. Do not drive, use machinery, or do anything that needs mental alertness until you know how this medicine affects you. Do not sit or stand up quickly, especially if you are an older patient. This reduces the risk of dizzy or fainting spells. Contact your doctor if these symptoms continue. Alcohol may interfere with the effect of this medicine. Avoid alcoholic drinks. This medicine may increase blood sugar. Ask your healthcare provider if changes in diet or medicines are needed if you have diabetes. What side effects may I notice from receiving this medicine? Side effects that you should report to your doctor or health care professional as soon as possible:  allergic reactions like skin rash, itching or hives  cold or numb hands or feet  depression  difficulty breathing  faint  fever with sore throat  irregular heartbeat, chest pain  rapid weight gain   signs and symptoms of high blood sugar such as being more thirsty or hungry or having to urinate more than normal. You may also feel very tired or have blurry vision.  swollen legs or ankles Side effects that usually do not require medical attention (report to your doctor or health care professional if they continue or are bothersome):  anxiety or nervousness  change in sex drive or performance  dry skin  headache  nightmares or trouble sleeping  short term memory loss  stomach upset or diarrhea This list may not describe all possible side effects. Call your doctor for medical advice about side effects. You may report side effects to FDA at 1-800-FDA-1088. Where should I keep my medicine? Keep out of the reach of children and pets. Store at room temperature between 15 and 30 degrees C (59 and 86 degrees F).  Protect from moisture. Keep the container tightly closed. Throw away any unused drug after the expiration date. NOTE: This sheet is a summary. It may not cover all possible information. If you have questions about this medicine, talk to your doctor, pharmacist, or health care provider.  2020 Elsevier/Gold Standard (2018-10-15 17:21:17)

## 2019-04-02 LAB — TSH: TSH: 1.51 u[IU]/mL (ref 0.450–4.500)

## 2019-04-07 ENCOUNTER — Telehealth: Payer: Self-pay

## 2019-04-07 NOTE — Telephone Encounter (Signed)
-----   Message from Garwin Brothers, MD sent at 04/02/2019 12:07 PM EST ----- The results of the study is unremarkable. Please inform patient. I will discuss in detail at next appointment. Cc  primary care/referring physician Garwin Brothers, MD 04/02/2019 12:07 PM

## 2019-04-07 NOTE — Telephone Encounter (Signed)
Left message that labs were ok, copy sent to Dr. Barbaraann Barthel

## 2019-04-20 MED ORDER — NEBIVOLOL HCL 5 MG PO TABS: 2.5000 mg | ORAL_TABLET | Freq: Every day | ORAL | 3 refills | Status: DC

## 2019-04-20 NOTE — Telephone Encounter (Signed)
Follow Up  Patient is calling in to follow up about the mychart message she sent about the medication. States that it is making her feel awful. Please give her a call back and if she does not answer, it is ok to leave detailed vm.

## 2019-04-23 DIAGNOSIS — B029 Zoster without complications: Secondary | ICD-10-CM | POA: Diagnosis not present

## 2019-04-26 ENCOUNTER — Telehealth: Payer: Self-pay | Admitting: Cardiology

## 2019-04-26 NOTE — Telephone Encounter (Signed)
New Message    Pt is calling and says her insurance is requiring Prior Authorization on nebivolol (BYSTOLIC) 5 MG tablet   Please advise

## 2019-05-21 ENCOUNTER — Emergency Department (HOSPITAL_BASED_OUTPATIENT_CLINIC_OR_DEPARTMENT_OTHER)
Admission: EM | Admit: 2019-05-21 | Discharge: 2019-05-21 | Disposition: A | Discharge status: Home / Self Care | Payer: BC Managed Care – PPO | Attending: Emergency Medicine | Admitting: Emergency Medicine

## 2019-05-21 ENCOUNTER — Encounter (HOSPITAL_BASED_OUTPATIENT_CLINIC_OR_DEPARTMENT_OTHER): Payer: Self-pay | Admitting: *Deleted

## 2019-05-21 ENCOUNTER — Other Ambulatory Visit: Payer: Self-pay

## 2019-05-21 DIAGNOSIS — Z880 Allergy status to penicillin: Secondary | ICD-10-CM | POA: Insufficient documentation

## 2019-05-21 DIAGNOSIS — Z79899 Other long term (current) drug therapy: Secondary | ICD-10-CM | POA: Insufficient documentation

## 2019-05-21 DIAGNOSIS — Z885 Allergy status to narcotic agent status: Secondary | ICD-10-CM | POA: Insufficient documentation

## 2019-05-21 DIAGNOSIS — G43009 Migraine without aura, not intractable, without status migrainosus: Secondary | ICD-10-CM | POA: Diagnosis not present

## 2019-05-21 DIAGNOSIS — Z888 Allergy status to other drugs, medicaments and biological substances status: Secondary | ICD-10-CM | POA: Diagnosis not present

## 2019-05-21 DIAGNOSIS — F17210 Nicotine dependence, cigarettes, uncomplicated: Secondary | ICD-10-CM | POA: Diagnosis not present

## 2019-05-21 DIAGNOSIS — Z881 Allergy status to other antibiotic agents status: Secondary | ICD-10-CM | POA: Diagnosis not present

## 2019-05-21 DIAGNOSIS — R519 Headache, unspecified: Secondary | ICD-10-CM | POA: Diagnosis not present

## 2019-05-21 DIAGNOSIS — Z91018 Allergy to other foods: Secondary | ICD-10-CM | POA: Diagnosis not present

## 2019-05-21 MED ORDER — PROMETHAZINE HCL 25 MG/ML IJ SOLN
INTRAMUSCULAR | Status: AC
  Administered 2019-05-21: 25 mg via INTRAVENOUS
  Filled 2019-05-21: qty 1

## 2019-05-21 MED ORDER — PROMETHAZINE HCL 25 MG/ML IJ SOLN: 25.0000 mg | Freq: Once | INTRAMUSCULAR | Status: AC

## 2019-05-21 MED ORDER — KETOROLAC TROMETHAMINE 30 MG/ML IJ SOLN
30.0000 mg | Freq: Once | INTRAMUSCULAR | Status: AC
  Administered 2019-05-21: 30 mg via INTRAVENOUS
  Filled 2019-05-21: qty 1

## 2019-05-21 MED ORDER — SODIUM CHLORIDE 0.9 % IV BOLUS
1000.0000 mL | Freq: Once | INTRAVENOUS | Status: AC
  Administered 2019-05-21: 1000 mL via INTRAVENOUS

## 2019-05-21 NOTE — ED Triage Notes (Signed)
Headache x 2 days. Hx of migraines. She also states she hit her head on her car trunk before the headache started. She is not on blood thinners.

## 2019-05-21 NOTE — Discharge Instructions (Addendum)
Return here as needed.  Increase your fluid intake.  Follow-up with your primary doctor. 

## 2019-05-21 NOTE — ED Provider Notes (Signed)
[facility] EMERGENCY DEPARTMENT Provider Note   CSN: [id number] Arrival date & time: 05/21/19  1957     History Chief Complaint  Patient presents with  . Headache    Valerie [name] is a 29 y.o. female.  HPI Patient presents to the emergency department with migraine headache that started Wednesday.  The patient states that is been ongoing over the last few days.  The patient states nothing seems make the condition better or worse.  Patient states she took some Excedrin with no relief of her symptoms.  The patient denies chest pain, shortness of breath, ,blurred vision, neck pain, fever, cough, weakness, numbness, dizziness, anorexia, edema, abdominal pain, nausea, vomiting, diarrhea, rash, back pain, dysuria, hematemesis, bloody stool, near syncope, or syncope.    Past Medical History:  Diagnosis Date  . Abnormal Pap smear   . Anemia   . Anxiety   . 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 BV (bacterial vaginosis)   . Depression   . Eczema   . First degree heart block   . Gastric ulcer    2000  . Heart murmur   . Infection    UTI, chronic sinus inf  . Migraine   . Mitral valve regurgitation   . Ovarian cyst   . Tricuspid valve regurgitation   . UTI (urinary tract infection)     Patient Active Problem List   Diagnosis Date Noted  . Postpartum care following vaginal delivery 04/29/2018  . 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 Hereditary disease in family possibly affecting fetus, antepartum 11/10/2017  . Palpitations 10/16/2017  . Cardiac murmur 10/16/2017  . First degree AV block 10/16/2017  . Unwanted fertility 10/06/2017  . Skin tag of perianal region 12/14/2013  . Depressive disorder, not elsewhere classified 12/14/2013  . Former smoker, stopped smoking in distant past 11/27/2012  . Family history of diabetes in pregnancy 11/27/2012  . History of facial weakness/Neuro workup/Lesion on MRI w/out change 11/27/2012    Past Surgical  History:  Procedure Laterality Date  . ADENOIDECTOMY    . COLPOSCOPY    . CRYOTHERAPY    . LEEP    . MYRINGOTOMY WITH TUBE PLACEMENT    . TUBAL LIGATION N/A 04/01/2018   Procedure: POST PARTUM TUBAL LIGATION;  Surgeon: Kathrynn Running, MD;  Location: [facility] BIRTHING SUITES;  Service: Gynecology;  Laterality: N/A;     OB History    Gravida  4   Para  2   Term  2   Preterm      AB  2   Living  2     SAB  2   TAB      Ectopic      Multiple  0   Live Births  2           Family History  Problem Relation Age of Onset  . Hypertension Mother   . Diabetes Father   . Hypertension Father   . Cancer Paternal Aunt        female  . Diabetes Paternal Uncle   . Diabetes Paternal Grandmother   . Hypertension Paternal Grandmother   . Diabetes Paternal Grandfather   . Hypertension Paternal Grandfather     Social History   Tobacco Use  . Smoking status: Current Every Day Smoker    Packs/day: 1.00    Types: Cigarettes    Last attempt to quit: 08/05/2017    Years since quitting: 1.7  .  Smokeless tobacco: Never Used  Substance Use Topics  . Alcohol use: Yes    Comment: rarely  . Drug use: No    Home Medications Prior to Admission medications   Medication Sig Start Date End Date Taking? Authorizing Provider  Menthol, Topical Analgesic, (BIOFREEZE) 4 % GEL Apply 1 application topically 4 (four) times daily as needed (For back pain.).    [provider]  nebivolol (BYSTOLIC) 5 MG tablet Take 0.5 tablets (2.5 mg total) by mouth daily. May increase to one whole tablet (5 mg)  if needed. 04/20/19   Revankar, Reita Cliche, MD  Prenatal Vit-Fe Fumarate-FA (PRENATAL MULTIVITAMIN) TABS tablet Take 1 tablet by mouth daily at 12 noon.    [provider]    Allergies    Augmentin [amoxicillin-pot clavulanate], Ciprofloxacin, Doxycycline, Hydrocodone, Prednisone, and Raspberry  Review of Systems   Review of Systems All other systems negative except as documented  in the HPI. All pertinent positives and negatives as reviewed in the HPI. Physical Exam Updated Vital Signs BP 104/68 (BP Location: Right Arm)   Pulse 79   Temp 98 F (36.7 C) (Oral)   Resp 18   Ht 5\' 5"  (1.651 m)   Wt 97.5 kg   SpO2 100%   BMI 35.78 kg/m   Physical Exam Vitals and nursing note reviewed.  Constitutional:      General: She is not in acute distress.    Appearance: She is well-developed.  HENT:     Head: Normocephalic and atraumatic.  Eyes:     Pupils: Pupils are equal, round, and reactive to light.  Cardiovascular:     Rate and Rhythm: Normal rate and regular rhythm.     Heart sounds: Normal heart sounds. No murmur. No friction rub. No gallop.   Pulmonary:     Effort: Pulmonary effort is normal. No respiratory distress.     Breath sounds: Normal breath sounds. No wheezing.  Musculoskeletal:     Cervical back: Normal range of motion and neck supple.  Skin:    General: Skin is warm and dry.     Capillary Refill: Capillary refill takes less than 2 seconds.     Findings: No erythema or rash.  Neurological:     Mental Status: She is alert and oriented to person, place, and time.     Motor: No abnormal muscle tone.     Coordination: Coordination normal.  Psychiatric:        Behavior: Behavior normal.     ED Results / Procedures / Treatments   Labs (all labs ordered are listed, but only abnormal results are displayed) Labs Reviewed - No data to display  EKG None  Radiology No results found.  Procedures Procedures (including critical care time)  Medications Ordered in ED Medications  sodium chloride 0.9 % bolus 1,000 mL (0 mLs Intravenous Stopped 05/21/19 2241)  ketorolac (TORADOL) 30 MG/ML injection 30 mg (30 mg Intravenous Given 05/21/19 2111)  promethazine (PHENERGAN) injection 25 mg (25 mg Intravenous Given 05/21/19 2112)    ED Course  I have reviewed the triage vital signs and the nursing notes.  Pertinent labs & imaging results that were  available during my care of the patient were reviewed by me and considered in my medical decision making (see chart for details).    MDM Rules/Calculators/A&P                      Patient was given IV fluids along with medications and  is feeling vastly improved.  Patient will be discharged home.  Told to return here as needed.  Patient agrees the plan and all questions were answered. Final Clinical Impression(s) / ED Diagnoses Final diagnoses:  None    Rx / DC Orders ED Discharge Orders    None       Charlestine Night, PA-C 05/21/19 2249    Geoffery Lyons, MD 05/21/19 [phone]

## 2019-06-06 ENCOUNTER — Other Ambulatory Visit: Payer: Self-pay

## 2019-06-06 ENCOUNTER — Encounter (HOSPITAL_COMMUNITY): Payer: Self-pay | Admitting: Emergency Medicine

## 2019-06-06 ENCOUNTER — Ambulatory Visit (HOSPITAL_COMMUNITY)
Admission: EM | Admit: 2019-06-06 | Discharge: 2019-06-06 | Disposition: A | Discharge status: Home / Self Care | Payer: BC Managed Care – PPO | Attending: Emergency Medicine | Admitting: Emergency Medicine

## 2019-06-06 DIAGNOSIS — N920 Excessive and frequent menstruation with regular cycle: Secondary | ICD-10-CM | POA: Insufficient documentation

## 2019-06-06 DIAGNOSIS — Z3202 Encounter for pregnancy test, result negative: Secondary | ICD-10-CM

## 2019-06-06 DIAGNOSIS — R42 Dizziness and giddiness: Secondary | ICD-10-CM | POA: Insufficient documentation

## 2019-06-06 DIAGNOSIS — N898 Other specified noninflammatory disorders of vagina: Secondary | ICD-10-CM

## 2019-06-06 DIAGNOSIS — Z113 Encounter for screening for infections with a predominantly sexual mode of transmission: Secondary | ICD-10-CM

## 2019-06-06 LAB — BASIC METABOLIC PANEL
Anion gap: 11 (ref 5–15)
BUN: 15 mg/dL (ref 6–20)
CO2: 19 mmol/L — ABNORMAL LOW (ref 22–32)
Calcium: 9.3 mg/dL (ref 8.9–10.3)
Chloride: 110 mmol/L (ref 98–111)
Creatinine, Ser: 0.88 mg/dL (ref 0.44–1.00)
GFR calc Af Amer: >60 mL/min (ref >60)
GFR calc non Af Amer: >60 mL/min (ref >60)
Glucose, Bld: 95 mg/dL (ref 70–99)
Potassium: 4.2 mmol/L (ref 3.5–5.1)
Sodium: 140 mmol/L (ref 135–145)

## 2019-06-06 LAB — CBC WITH DIFFERENTIAL/PLATELET
Abs Immature Granulocytes: 0.01 10*3/uL (ref 0.00–0.07)
Basophils Absolute: 0.1 10*3/uL (ref 0.0–0.1)
Basophils Relative: 1 %
Eosinophils Absolute: 0.4 10*3/uL (ref 0.0–0.5)
Eosinophils Relative: 5 %
HCT: 38.8 % (ref 36.0–46.0)
Hemoglobin: 12.6 g/dL (ref 12.0–15.0)
Immature Granulocytes: 0 %
Lymphocytes Relative: 30 %
Lymphs Abs: 2.2 10*3/uL (ref 0.7–4.0)
MCH: 29.6 pg (ref 26.0–34.0)
MCHC: 32.5 g/dL (ref 30.0–36.0)
MCV: 91.3 fL (ref 80.0–100.0)
Monocytes Absolute: 0.5 10*3/uL (ref 0.1–1.0)
Monocytes Relative: 6 %
Neutro Abs: 4.1 10*3/uL (ref 1.7–7.7)
Neutrophils Relative %: 58 %
Platelets: 212 10*3/uL (ref 150–400)
RBC: 4.25 MIL/uL (ref 3.87–5.11)
RDW: 13.2 % (ref 11.5–15.5)
WBC: 7.2 10*3/uL (ref 4.0–10.5)
nRBC: 0 % (ref 0.0–0.2)

## 2019-06-06 LAB — POCT URINALYSIS DIP (DEVICE)
Bilirubin Urine: NEGATIVE
Glucose, UA: NEGATIVE mg/dL
Ketones, ur: NEGATIVE mg/dL
Nitrite: NEGATIVE
Protein, ur: NEGATIVE mg/dL
Specific Gravity, Urine: >=1.03 (ref 1.005–1.030)
Urobilinogen, UA: 0.2 mg/dL (ref 0.0–1.0)
pH: 5.5 (ref 5.0–8.0)

## 2019-06-06 LAB — POC URINE PREG, ED: Preg Test, Ur: NEGATIVE

## 2019-06-06 LAB — HIV ANTIBODY (ROUTINE TESTING W REFLEX): HIV Screen 4th Generation wRfx: NONREACTIVE

## 2019-06-06 LAB — POCT PREGNANCY, URINE: Preg Test, Ur: NEGATIVE

## 2019-06-06 LAB — TSH: TSH: 2.706 u[IU]/mL (ref 0.350–4.500)

## 2019-06-06 NOTE — ED Provider Notes (Signed)
[facility]    CSN: [id number] Arrival date & time: 06/06/19  1620      History   Chief Complaint Chief Complaint  Patient presents with  . Vaginal Itching    HPI Valerie [name] is a 29 y.o. female.   Valerie [name] presents with complaints of vaginal irritation, "like there is glass" in her vagina. No specific vaginal itching, odor or discharge. Symptoms started 2 weeks ago. She tried a 7 day course of monistat which didn't help or worsen her symptoms. She uses continuous nuva ring to manage heavy periods. This was started in January of this year. Has not had a true period since January but does note some brown spotting intermittently. Had spotting yesterday.  Occasional pelvic pain, denies currently. States she just found out that her husband has had an affair, she is interested in std screening. She has had a tubal ligation. No back pain. No fevers. Sometimes sensation she cannot empty her bladder but otherwise no pain with urination or frequency.    Also complaints of fatigue and feeling dizzy intermittently. When she feels dizzy she feels shortness of breath. This has been ongoing for some time. She experiences palpitations. For the past three weeks she feels it has been worse, worse with position changes. No ear pain or URI symptoms. She does have a history of allergies but these are not different than normal for her. She does follow with cardiology, was started on metoprolol, she was unable to tolerate this so was placed on bystolic, which her insurance wouldn't cover. She is not currently taking any medications. History of first degree block. She does follow with a PCP as well.      Past Medical History:  Diagnosis Date  . Abnormal Pap smear   . Anemia   . Anxiety   . 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 BV (bacterial vaginosis)   . Depression   . Eczema   . First degree heart block   . Gastric ulcer    2000  . Heart murmur   .  Infection    UTI, chronic sinus inf  . Migraine   . Mitral valve regurgitation   . Ovarian cyst   . Tricuspid valve regurgitation   . UTI (urinary tract infection)     Patient Active Problem List   Diagnosis Date Noted  . Postpartum care following vaginal delivery 04/29/2018  . 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 Hereditary disease in family possibly affecting fetus, antepartum 11/10/2017  . Palpitations 10/16/2017  . Cardiac murmur 10/16/2017  . First degree AV block 10/16/2017  . Unwanted fertility 10/06/2017  . Skin tag of perianal region 12/14/2013  . Depressive disorder, not elsewhere classified 12/14/2013  . Former smoker, stopped smoking in distant past 11/27/2012  . Family history of diabetes in pregnancy 11/27/2012  . History of facial weakness/Neuro workup/Lesion on MRI w/out change 11/27/2012    Past Surgical History:  Procedure Laterality Date  . ADENOIDECTOMY    . COLPOSCOPY    . CRYOTHERAPY    . LEEP    . MYRINGOTOMY WITH TUBE PLACEMENT    . TUBAL LIGATION N/A 04/01/2018   Procedure: POST PARTUM TUBAL LIGATION;  Surgeon: Kathrynn Running, MD;  Location: [facility] BIRTHING SUITES;  Service: Gynecology;  Laterality: N/A;    OB History    Gravida  4   Para  2   Term  2   Preterm  AB  2   Living  2     SAB  2   TAB      Ectopic      Multiple  0   Live Births  2            Home Medications    Prior to Admission medications   Medication Sig Start Date End Date Taking? Authorizing Provider  Menthol, Topical Analgesic, (BIOFREEZE) 4 % GEL Apply 1 application topically 4 (four) times daily as needed (For back pain.).    [provider]  nebivolol (BYSTOLIC) 5 MG tablet Take 0.5 tablets (2.5 mg total) by mouth daily. May increase to one whole tablet (5 mg)  if needed. 04/20/19   Revankar, Aundra Dubin, MD  Prenatal Vit-Fe Fumarate-FA (PRENATAL MULTIVITAMIN) TABS tablet Take 1 tablet by mouth daily at 12 noon.    [provider]     Family History Family History  Problem Relation Age of Onset  . Hypertension Mother   . Diabetes Father   . Hypertension Father   . Cancer Paternal Aunt        female  . Diabetes Paternal Uncle   . Diabetes Paternal Grandmother   . Hypertension Paternal Grandmother   . Diabetes Paternal Grandfather   . Hypertension Paternal Grandfather     Social History Social History   Tobacco Use  . Smoking status: Current Every Day Smoker    Packs/day: 1.00    Types: Cigarettes    Last attempt to quit: 08/05/2017    Years since quitting: 1.8  . Smokeless tobacco: Never Used  Substance Use Topics  . Alcohol use: Yes    Comment: rarely  . Drug use: No     Allergies   Augmentin [amoxicillin-pot clavulanate], Ciprofloxacin, Doxycycline, Hydrocodone, Prednisone, and Raspberry   Review of Systems Review of Systems   Physical Exam Triage Vital Signs ED Triage Vitals  Enc Vitals Group     BP 06/06/19 1631 116/80     Pulse Rate 06/06/19 1631 89     Resp 06/06/19 1631 16     Temp 06/06/19 1631 98.2 F (36.8 C)     Temp Source 06/06/19 1631 Oral     SpO2 06/06/19 1631 96 %     Weight --      Height --      Head Circumference --      Peak Flow --      Pain Score 06/06/19 1629 4     Pain Loc --      Pain Edu? --      Excl. in GC? --    No data found.  Updated Vital Signs BP 116/80   Pulse 89   Temp 98.2 F (36.8 C) (Oral)   Resp 16   SpO2 96%   Physical Exam Constitutional:      General: She is not in acute distress.    Appearance: She is well-developed.  HENT:     Head: Normocephalic and atraumatic.     Right Ear: Tympanic membrane and ear canal normal.     Left Ear: Tympanic membrane and ear canal normal.  Cardiovascular:     Rate and Rhythm: Normal rate and regular rhythm.  Pulmonary:     Effort: Pulmonary effort is normal.     Breath sounds: Normal breath sounds.  Abdominal:     Palpations: Abdomen is not rigid.     Tenderness: There is no abdominal  tenderness. There is no right CVA tenderness,  left CVA tenderness, guarding or rebound.  Genitourinary:    Comments: Denies sores, lesions, vaginal bleeding; no pelvic pain; gu exam deferred at this time, vaginal self swab collected.   Skin:    General: Skin is warm and dry.  Neurological:     Mental Status: She is alert and oriented to person, place, and time.    EKG:  NSR rate of 80 . Previous EKG was available for review. No stwave changes as interpreted by me.    UC Treatments / Results  Labs (all labs ordered are listed, but only abnormal results are displayed) Labs Reviewed  POCT URINALYSIS DIP (DEVICE) - Abnormal; Notable for the following components:      Result Value   Hgb urine dipstick TRACE (*)    Leukocytes,Ua TRACE (*)    All other components within normal limits  URINE CULTURE  RPR  HIV ANTIBODY (ROUTINE TESTING W REFLEX)  CBC WITH DIFFERENTIAL/PLATELET  BASIC METABOLIC PANEL  TSH  POC URINE PREG, ED  POCT PREGNANCY, URINE  CERVICOVAGINAL ANCILLARY ONLY    EKG   Radiology No results found.  Procedures Procedures (including critical care time)  Medications Ordered in UC Medications - No data to display  Initial Impression / Assessment and Plan / UC Course  I have reviewed the triage vital signs and the nursing notes.  Pertinent labs & imaging results that were available during my care of the patient were reviewed by me and considered in my medical decision making (see chart for details).     Urine with trace leuks and hgb, as well as elevated specific gravity. Has had vaginal spotting, likely attributes to hgb. Culture pending. Vaginal cytology pending. Will await results before any treatment, concern about potential exposure due to husband infidelity recently. HIV and RPR pending. Basic labs including TSH collected related to episodes of dizziness. This unfortunately is not new for her but worsening. Not currently on any medication as she was unable  to tolerate prescribed medications from cardiology. Fluid intake encouraged especially with elevated urine specific gravity noted. No changes to EKG. Cardiology follow up encouraged. Return precautions provided. Patient verbalized understanding and agreeable to plan.  Ambulatory out of clinic without difficulty.    Final Clinical Impressions(s) / UC Diagnoses   Final diagnoses:  Vaginal irritation  Screening for STDs (sexually transmitted diseases)  Dizziness     Discharge Instructions     Negative for pregnancy today.  Urine overall looks well, no obvious infection, I have sent it to be cultured to confirm.  You do appear likely dehydrated per your urine.  This may be contributing some to your dizziness. Please increase your water intake. Limit any caffeine or alcohol.  Your EKG looks great today, no changes and no concerning findings.  Please follow up with your cardiologist for recheck, especially since you were unable to tolerate medications prescribed.  Will notify of you any positive findings from your vaginal swab and if any changes to treatment are needed.   If negative and still with symptoms I would want you to seek further evaluation either with your PCP or with gynecology, also to provide pelvic exam if your nuvaring is causing local irritation.  If worsening of dizziness, vision changes, palpitations, chest pain , or otherwise worsening please go to the ER.     ED Prescriptions    None     PDMP not reviewed this encounter.   Zigmund Gottron, NP 06/06/19 1717

## 2019-06-06 NOTE — ED Triage Notes (Signed)
Vaginal pain and irritation. She is concerned about STD. Uses nuvaring. Discomfort started 2 weeks ago.   She has been dizzy and fatigued for 1 month, she experiences shortness of breath when she is dizziness. Bruising more easily than normal. Has had low iron levels .

## 2019-06-06 NOTE — Discharge Instructions (Addendum)
Negative for pregnancy today.  Urine overall looks well, no obvious infection, I have sent it to be cultured to confirm.  You do appear likely dehydrated per your urine.  This may be contributing some to your dizziness. Please increase your water intake. Limit any caffeine or alcohol.  Your EKG looks great today, no changes and no concerning findings.  Please follow up with your cardiologist for recheck, especially since you were unable to tolerate medications prescribed.  Will notify of you any positive findings from your vaginal swab and if any changes to treatment are needed.   If negative and still with symptoms I would want you to seek further evaluation either with your PCP or with gynecology, also to provide pelvic exam if your nuvaring is causing local irritation.  If worsening of dizziness, vision changes, palpitations, chest pain , or otherwise worsening please go to the ER.

## 2019-06-07 LAB — URINE CULTURE: Culture: NO GROWTH

## 2019-06-07 LAB — RPR: RPR Ser Ql: NONREACTIVE

## 2019-06-08 ENCOUNTER — Telehealth (HOSPITAL_COMMUNITY): Payer: Self-pay

## 2019-06-08 LAB — CERVICOVAGINAL ANCILLARY ONLY
Bacterial vaginitis: NEGATIVE
Candida vaginitis: POSITIVE — AB
Chlamydia: NEGATIVE
Neisseria Gonorrhea: NEGATIVE
Trichomonas: NEGATIVE

## 2019-06-08 MED ORDER — FLUCONAZOLE 150 MG PO TABS: 150.0000 mg | ORAL_TABLET | Freq: Every day | ORAL | 0 refills | Status: AC

## 2019-06-09 NOTE — Telephone Encounter (Signed)
Patient contacted by phone and made aware of  results. Rx sent yesterday for yeast infection.  Pt verbalized understanding and had all questions answered.

## 2019-06-21 ENCOUNTER — Ambulatory Visit: Payer: Medicaid Other | Admitting: Cardiology

## 2019-06-23 DIAGNOSIS — F411 Generalized anxiety disorder: Secondary | ICD-10-CM | POA: Diagnosis not present

## 2019-06-23 DIAGNOSIS — F332 Major depressive disorder, recurrent severe without psychotic features: Secondary | ICD-10-CM | POA: Diagnosis not present

## 2019-07-16 DIAGNOSIS — F3341 Major depressive disorder, recurrent, in partial remission: Secondary | ICD-10-CM | POA: Diagnosis not present

## 2019-07-16 DIAGNOSIS — F411 Generalized anxiety disorder: Secondary | ICD-10-CM | POA: Diagnosis not present

## 2019-09-14 IMAGING — US US OB TRANSVAGINAL
1 series · 15 of 28 positions shown · non-contrast
Comparison: August 09, 2017

CLINICAL DATA: Recent pelvic ultrasound which did not show fetal
pole.

EXAM:
TRANSVAGINAL OB ULTRASOUND
TECHNIQUE: Transvaginal ultrasound was performed for complete evaluation of the
gestation as well as the maternal uterus, adnexal regions, and
pelvic cul-de-sac.

[Series 1: us ob transvaginal · 15 of 39 slices shown]
[im 1/39]
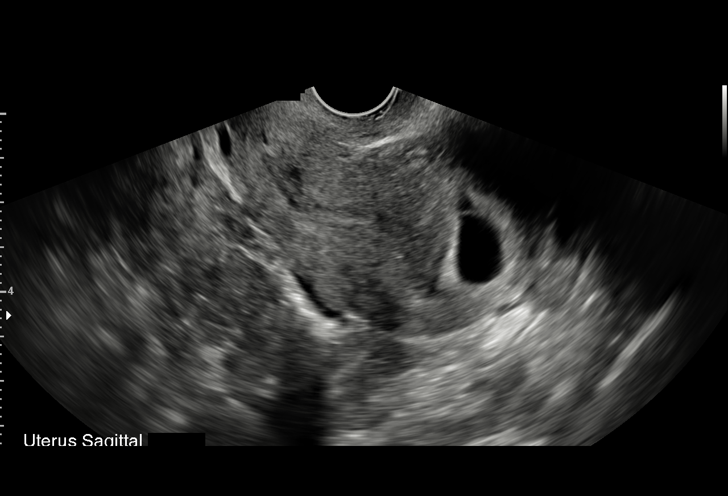
[im 3/39]
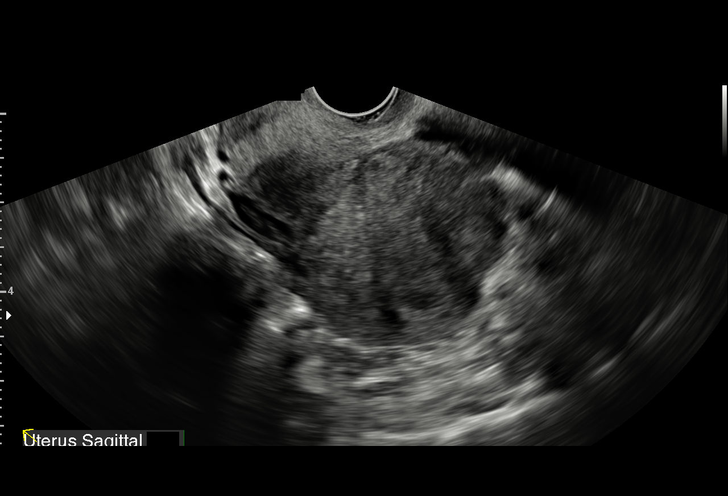
[im 6/39]
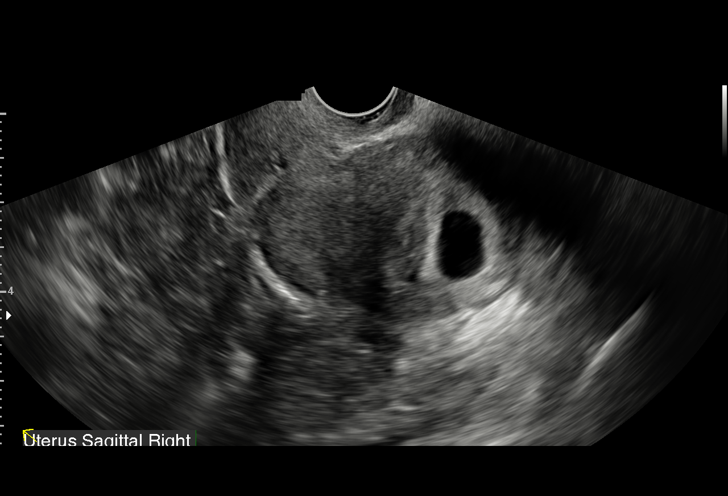
[im 9/39]
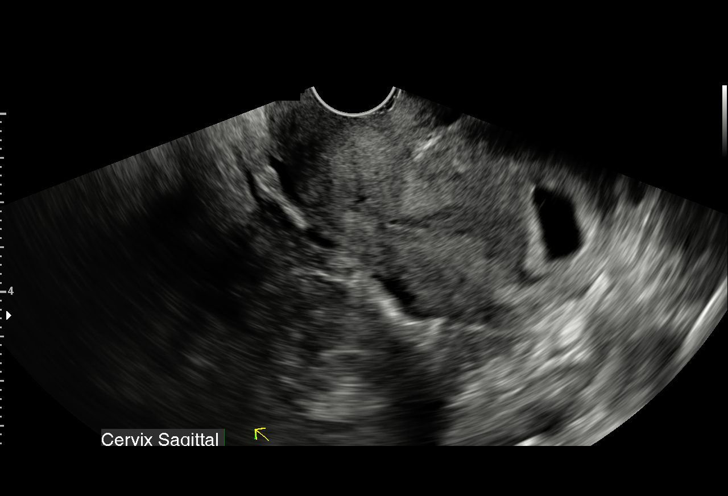
[im 12/39]
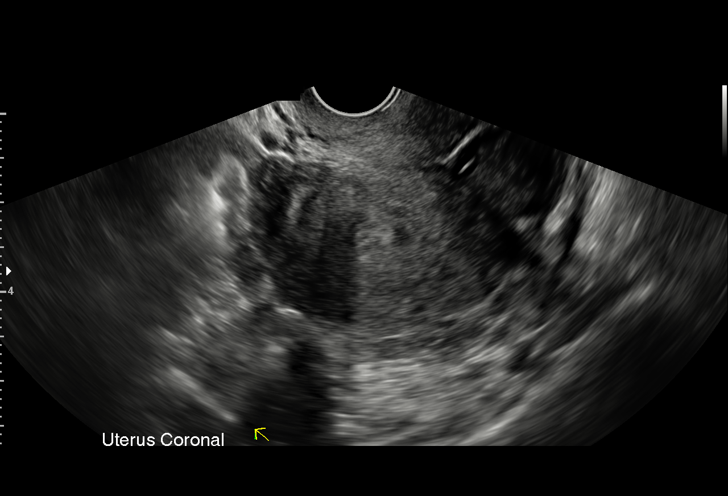
[im 15/39]
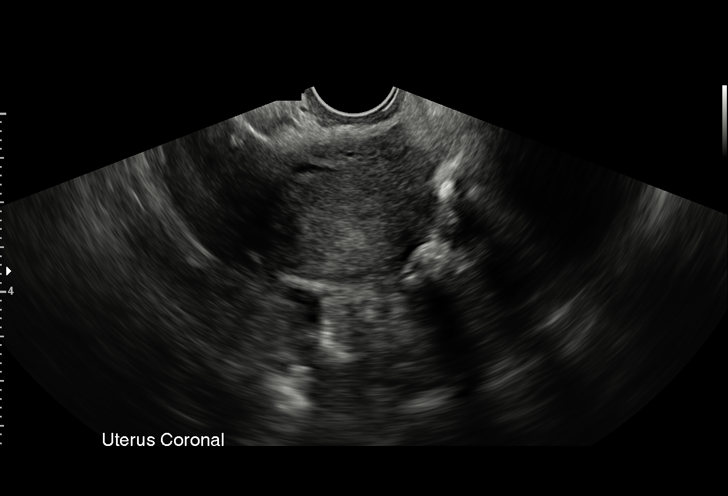
[im 17/39]
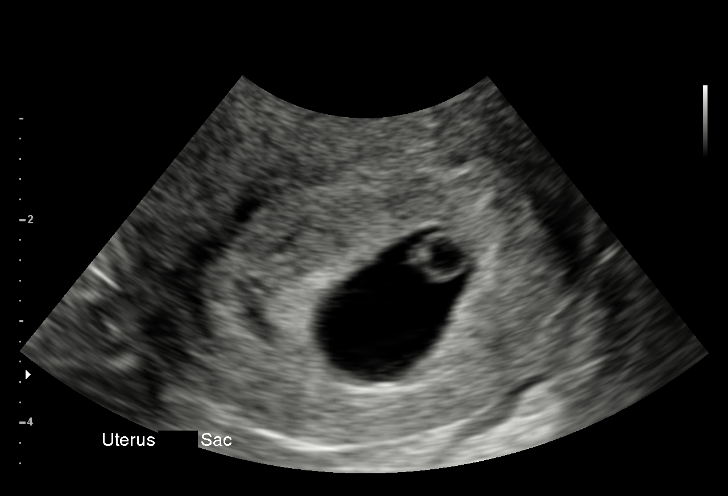
[im 20/39]
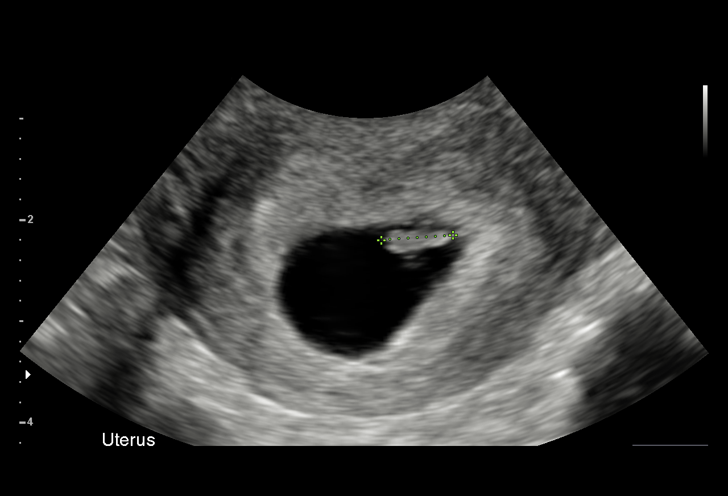
[im 22/39]
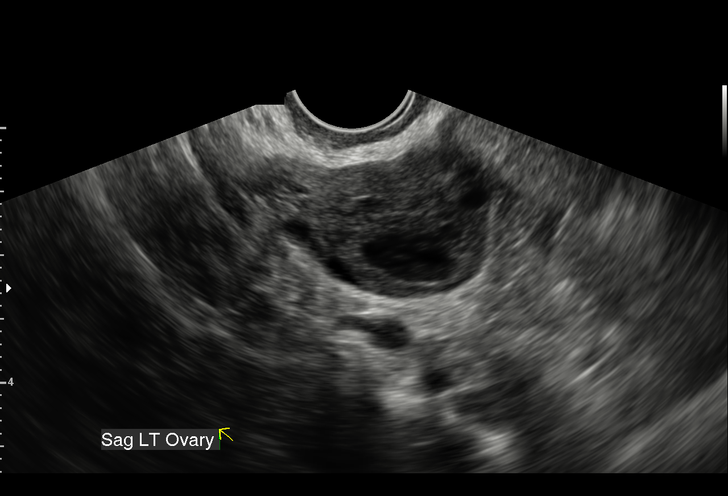
[im 24/39]
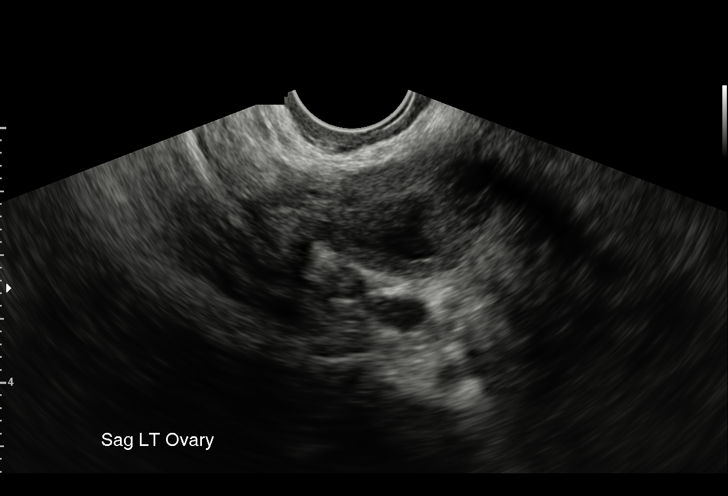
[im 27/39]
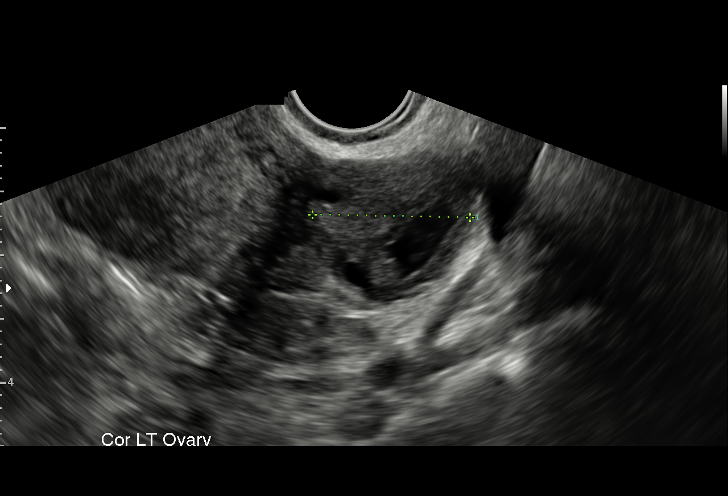
[im 30/39]
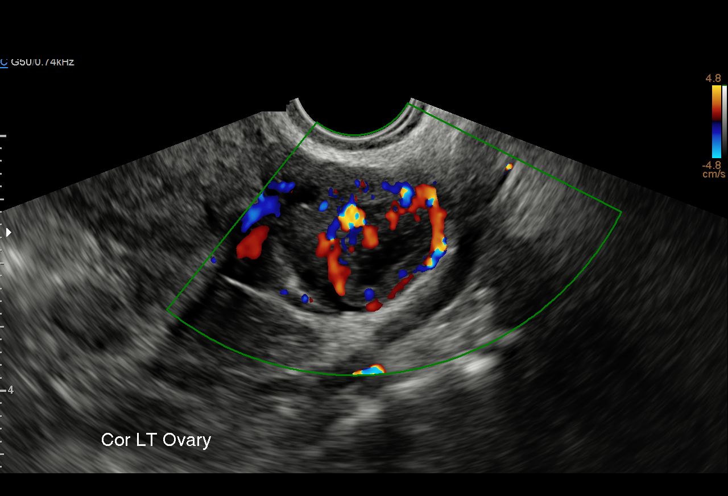
[im 33/39]
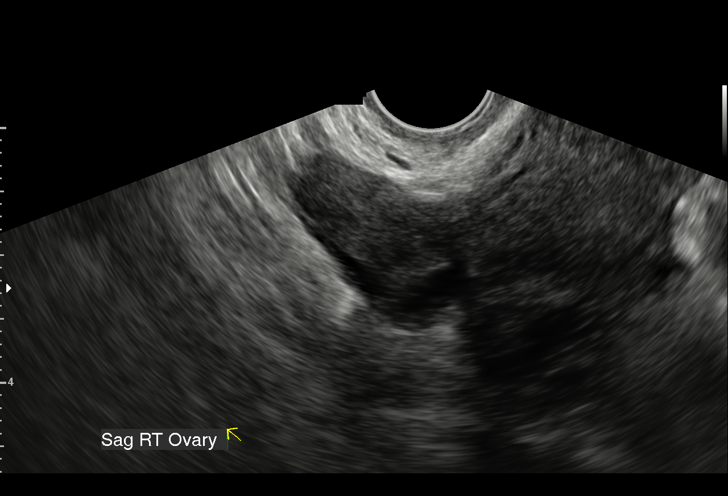
[im 36/39]
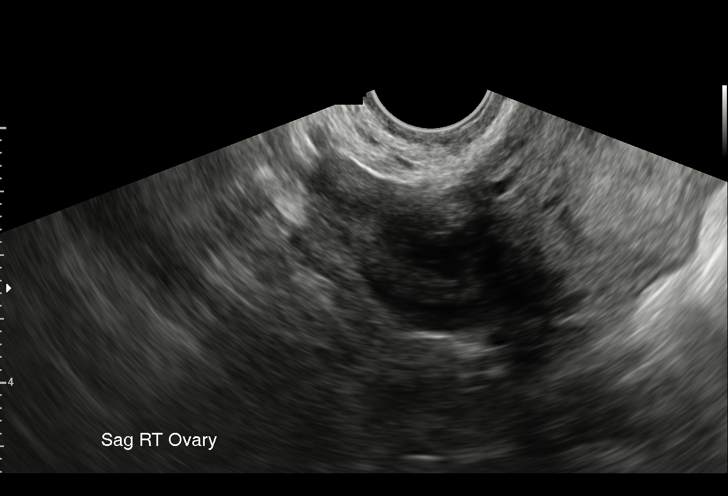
[im 39/39]
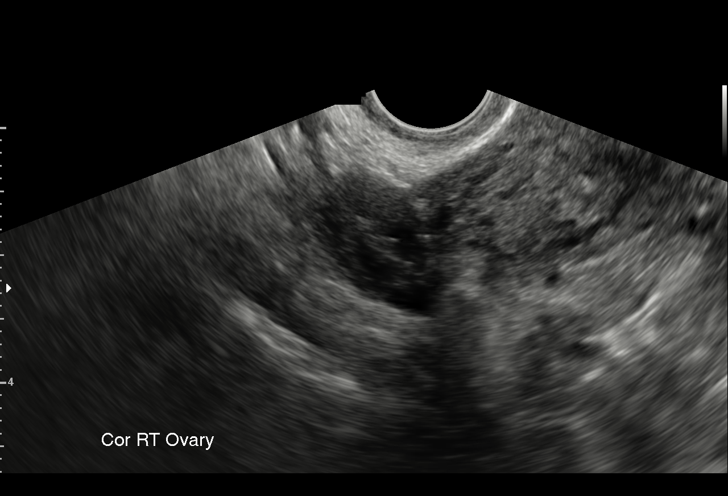

[15 of 28 positions shown; findings below may reference images not displayed]

FINDINGS: Intrauterine gestational sac: Visualized

Yolk sac:  Visualized

Embryo:  Visualized

Cardiac Activity: Visualized

Heart Rate: 110 bpm

CRL:   7 mm   6 w 3 d                  US EDC: April 10, 2018

Subchorionic hemorrhage:  None visualized.

Maternal uterus/adnexae: Cervical os is closed. Left ovary measures
3.1 x 2.4 x 2.5 cm. There is a corpus luteum measuring 1.4 x 1.0 x
1.1 cm in the left ovary. Right ovary measures 3.1 x 1.7 x 1.8 cm.
No free pelvic fluid.
IMPRESSION: Single live intrauterine gestation with estimated gestational age of
6+ weeks. No subchorionic hemorrhage. Study otherwise unremarkable.
Corpus luteum in left ovary is a physiologic finding.

## 2019-10-19 ENCOUNTER — Emergency Department (HOSPITAL_COMMUNITY): Payer: 59

## 2019-10-19 ENCOUNTER — Encounter (HOSPITAL_COMMUNITY): Payer: Self-pay | Admitting: Emergency Medicine

## 2019-10-19 ENCOUNTER — Emergency Department (HOSPITAL_COMMUNITY)
Admission: EM | Admit: 2019-10-19 | Discharge: 2019-10-20 | Disposition: A | Discharge status: Home / Self Care | Payer: 59 | Attending: Emergency Medicine | Admitting: Emergency Medicine

## 2019-10-19 DIAGNOSIS — R0789 Other chest pain: Secondary | ICD-10-CM | POA: Insufficient documentation

## 2019-10-19 DIAGNOSIS — Z5321 Procedure and treatment not carried out due to patient leaving prior to being seen by health care provider: Secondary | ICD-10-CM | POA: Diagnosis not present

## 2019-10-19 LAB — CBC
HCT: 36.4 % (ref 36.0–46.0)
Hemoglobin: 11.3 g/dL — ABNORMAL LOW (ref 12.0–15.0)
MCH: 29.9 pg (ref 26.0–34.0)
MCHC: 31 g/dL (ref 30.0–36.0)
MCV: 96.3 fL (ref 80.0–100.0)
Platelets: 238 10*3/uL (ref 150–400)
RBC: 3.78 MIL/uL — ABNORMAL LOW (ref 3.87–5.11)
RDW: 14 % (ref 11.5–15.5)
WBC: 8.5 10*3/uL (ref 4.0–10.5)
nRBC: 0 % (ref 0.0–0.2)

## 2019-10-19 LAB — BASIC METABOLIC PANEL
Anion gap: 13 (ref 5–15)
BUN: 8 mg/dL (ref 6–20)
CO2: 21 mmol/L — ABNORMAL LOW (ref 22–32)
Calcium: 9.3 mg/dL (ref 8.9–10.3)
Chloride: 105 mmol/L (ref 98–111)
Creatinine, Ser: 0.8 mg/dL (ref 0.44–1.00)
GFR calc Af Amer: >60 mL/min (ref >60)
GFR calc non Af Amer: >60 mL/min (ref >60)
Glucose, Bld: 115 mg/dL — ABNORMAL HIGH (ref 70–99)
Potassium: 3.6 mmol/L (ref 3.5–5.1)
Sodium: 139 mmol/L (ref 135–145)

## 2019-10-19 LAB — I-STAT BETA HCG BLOOD, ED (MC, WL, AP ONLY): I-stat hCG, quantitative: <5 m[IU]/mL (ref <5)

## 2019-10-19 LAB — TROPONIN I (HIGH SENSITIVITY): Troponin I (High Sensitivity): <2 ng/L (ref <18)

## 2019-10-19 MED ORDER — SODIUM CHLORIDE 0.9% FLUSH: 3.0000 mL | Freq: Once | INTRAVENOUS | Status: DC

## 2019-10-19 NOTE — ED Notes (Signed)
Pt stated that she did not want to wait and she would follow up with her primary care doctor in the morning. If the pain gets any worse then she will come back here.

## 2019-10-19 NOTE — ED Triage Notes (Signed)
Patient reports R sided CP onset just PTA. Patient reports pain started while sitting on couch. Had hamburger helper for dinner.

## 2020-01-12 IMAGING — US US MFM OB FOLLOW-UP
1 series · 14 of 28 positions shown · non-contrast
Comparison: none

[Series 1: us mfm ob follow-up · 44 acquisitions, 14 frames shown]
[im 2/44]
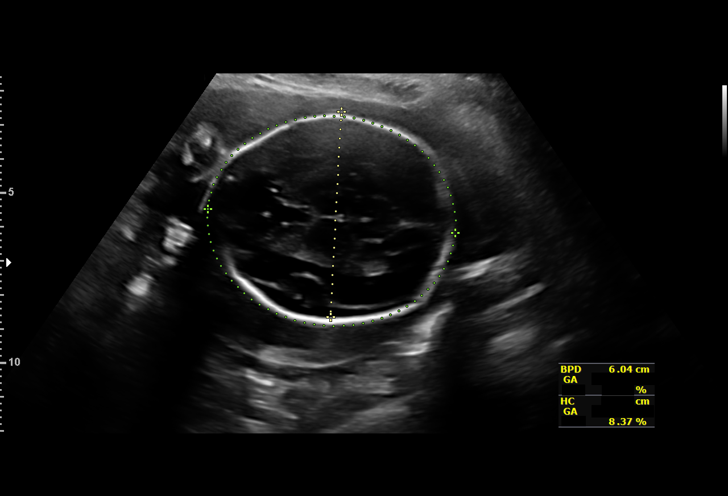
[im 5/44]
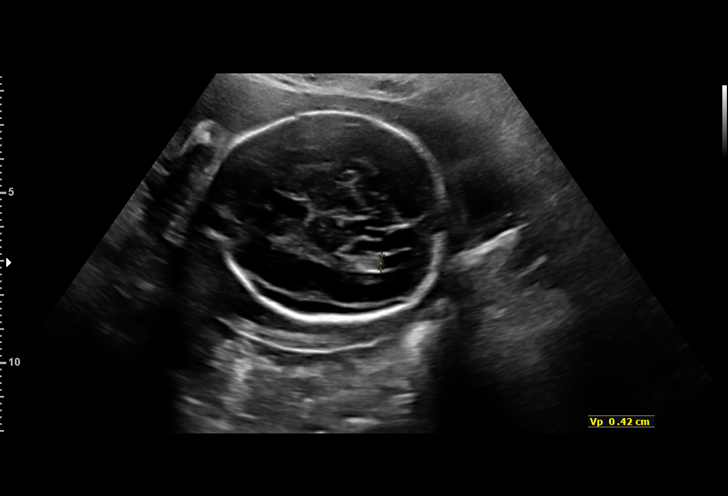
[im 8/44]
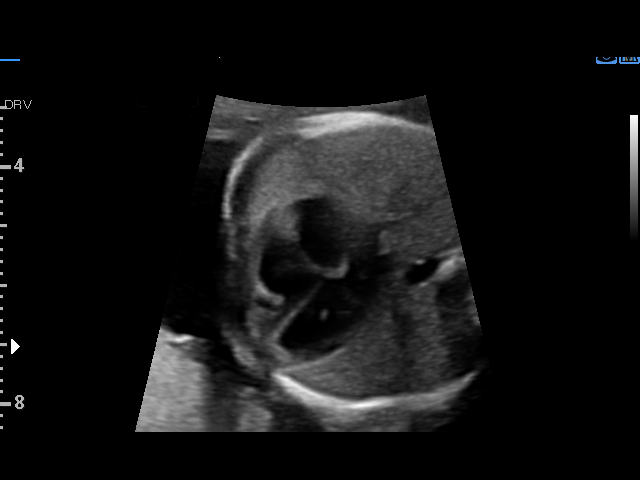
[im 12/44]
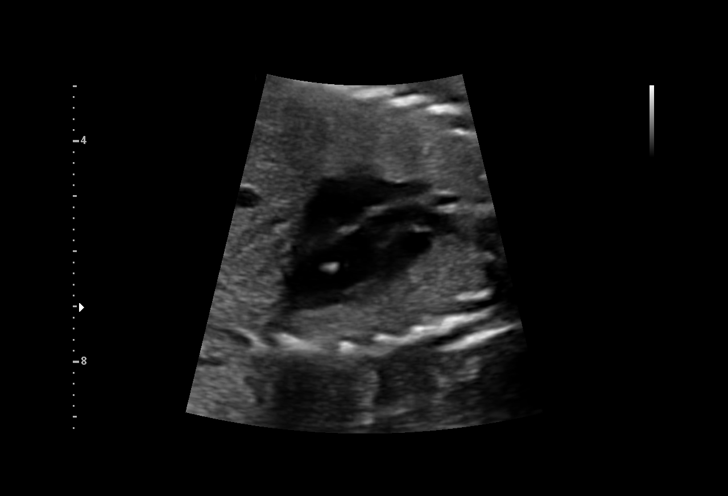
[im 15/44]
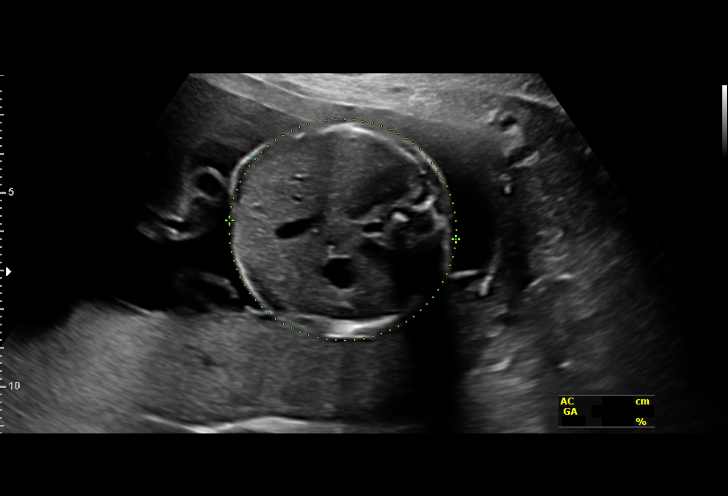
[im 18/44]
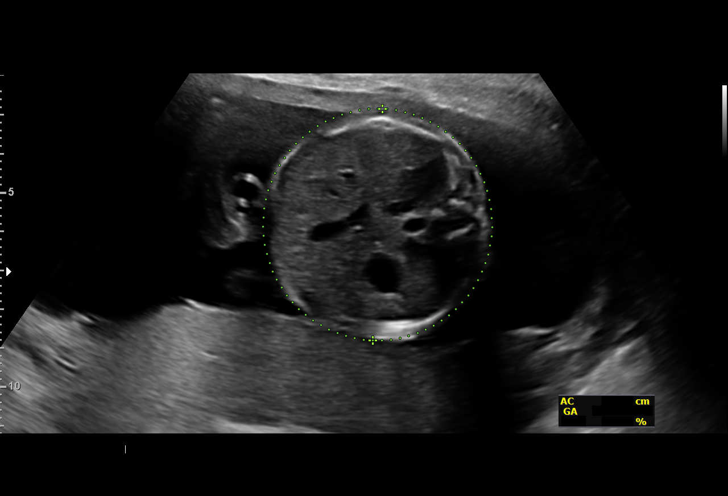
[im 21/44]
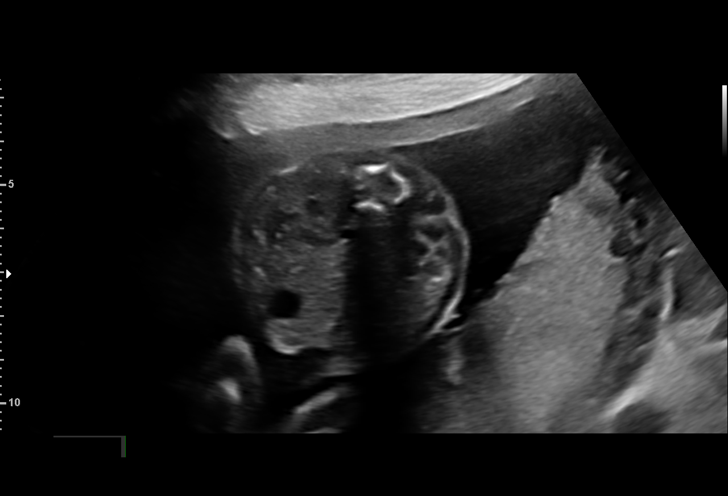
[im 24/44]
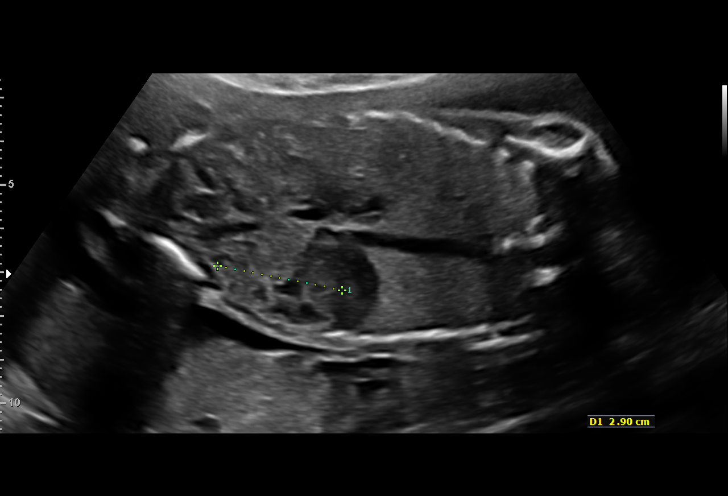
[im 28/44]
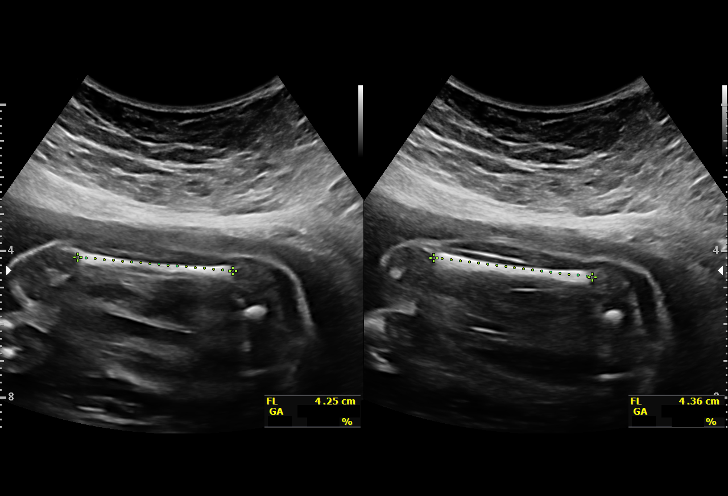
[im 31/44]
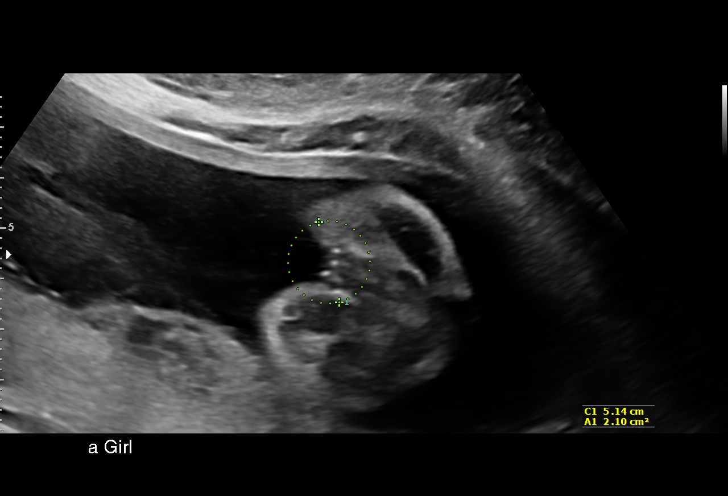
[im 34/44]
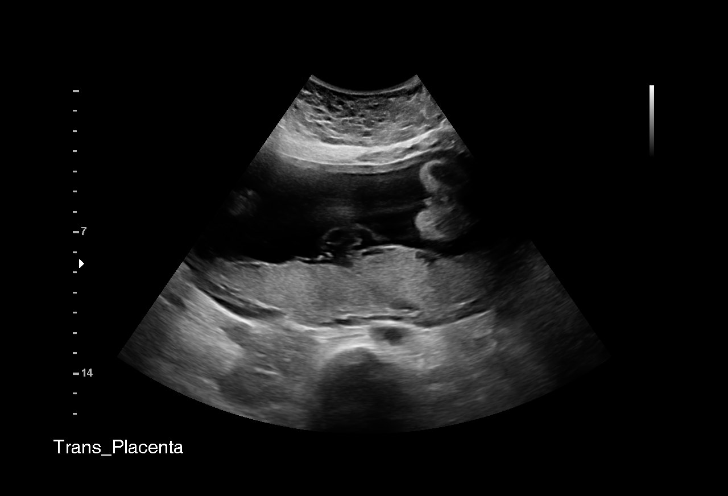
[im 37/44]
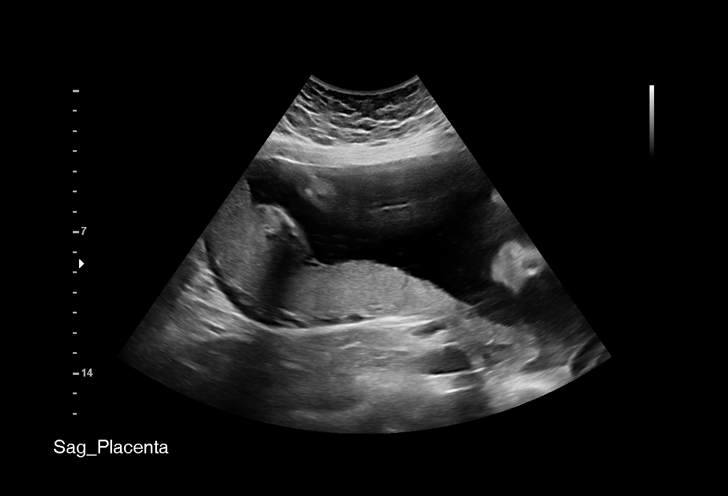
[im 40/44]
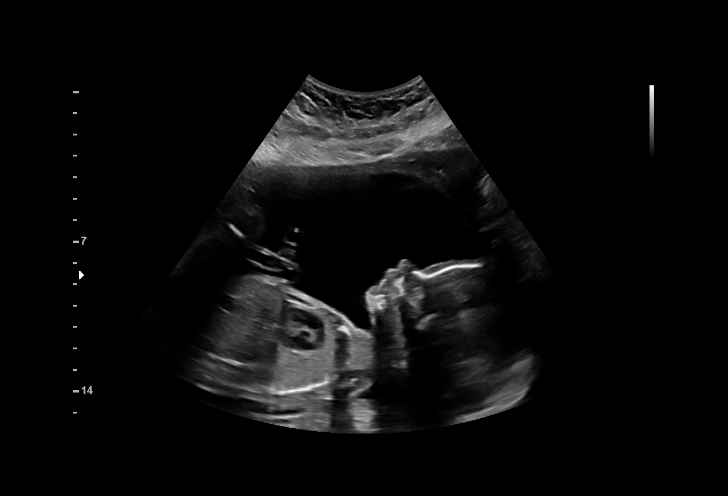
[im 44/44]
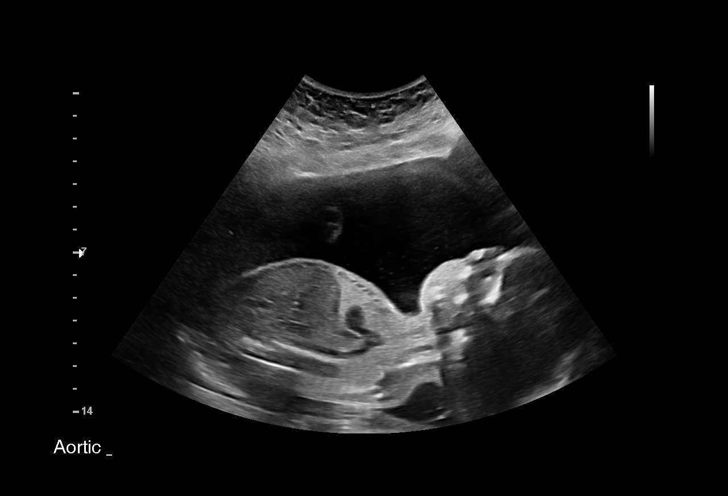

[14 of 28 positions shown; findings below may reference images not displayed]

Indications

Encounter for other antenatal screening
follow-up
24 weeks gestation of pregnancy
Obesity complicating pregnancy, second
trimester (pregravid BMI 36)
Heart disease in miother affecting pregnancy
in second trimester (First degree heart block)
Vital Signs

BMI:         33.3         Pulse:  101
BP:          106/68
Fetal Evaluation

Num Of Fetuses:         1
Fetal Heart Rate(bpm):  164
Cardiac Activity:       Observed
Presentation:           Cephalic
Placenta:               Posterior
P. Cord Insertion:      Previously Visualized

Amniotic Fluid
AFI FV:      Within normal limits

Largest Pocket(cm)
5.62
Biometry

BPD:      59.9  mm     G. Age:  24w 3d         54  %    CI:        79.73   %    70 - 86
FL/HC:      20.1   %    18.7 -
HC:       212   mm     G. Age:  23w 2d          9  %    HC/AC:      1.13        1.05 -
AC:       187   mm     G. Age:  23w 4d         22  %    FL/BPD:     71.3   %    71 - 87
FL:       42.7  mm     G. Age:  24w 0d         32  %    FL/AC:      22.8   %    20 - 24
HUM:      38.2  mm     G. Age:  23w 4d         27  %

LV:        4.2  mm

Est. FW:     618  gm      1 lb 6 oz     42  %
OB History

Gravidity:    4         Term:   1        Prem:   0        SAB:   2
TOP:          0       Ectopic:  0        Living: 1
Gestational Age

LMP:           24w 1d        Date:  06/30/17                 EDD:   04/06/18
U/S Today:     23w 6d                                        EDD:   04/08/18
Best:          24w 1d     Det. By:  LMP  (06/30/17)          EDD:   04/06/18
Anatomy

Cranium:               Appears normal         Aortic Arch:            Not well visualized
Cavum:                 Previously seen        Ductal Arch:            Previously seen
Ventricles:            Appears normal         Diaphragm:              Appears normal
Choroid Plexus:        Previously seen        Stomach:                Appears normal, left
sided
Cerebellum:            Previously seen        Abdomen:                Previously seen
Posterior Fossa:       Previously seen        Abdominal Wall:         Previously seen
Nuchal Fold:           Previously seen        Cord Vessels:           Previously seen
Face:                  Orbits and profile     Kidneys:                Appear normal
previously seen
Lips:                  Previously seen        Bladder:                Appears normal
Thoracic:              Appears normal         Spine:                  Previously seen
Heart:                 Echogenic focus        Upper Extremities:      Previously
in LV
visualized
RVOT:                  Previously seen        Lower Extremities:      Previously seen
LVOT:                  Appears normal

Other:  Heels and 5th digit prev visualized. Nasal bone prev visualized. Open
hands prev visualized.
Cervix Uterus Adnexa

Cervix
Not visualized (advanced GA >79wks)
Comments

U/S images reviewed. Findings reviewed with patient.
Appropriate fetal growth is noted.  No fetal abnormalities are
seen.
Questions answered.
10 minutes spent face to face with patient.
Recommendations: 1) Follow-up as clinically indicated
Recommendations

1) Follow-up as clinically indicated

## 2020-03-24 IMAGING — US US ABDOMEN COMPLETE
1 series · 14 of 25 positions shown · non-contrast
Comparison: None.

CLINICAL DATA: Patient is 25 weeks pregnant and has sustained a
fall and is complaining of abdominal pain.

EXAM:
ABDOMEN ULTRASOUND COMPLETE

[Series 1: us abdomen complete · 0.22mm/px · 14 of 83 slices shown]
[im 1/83]
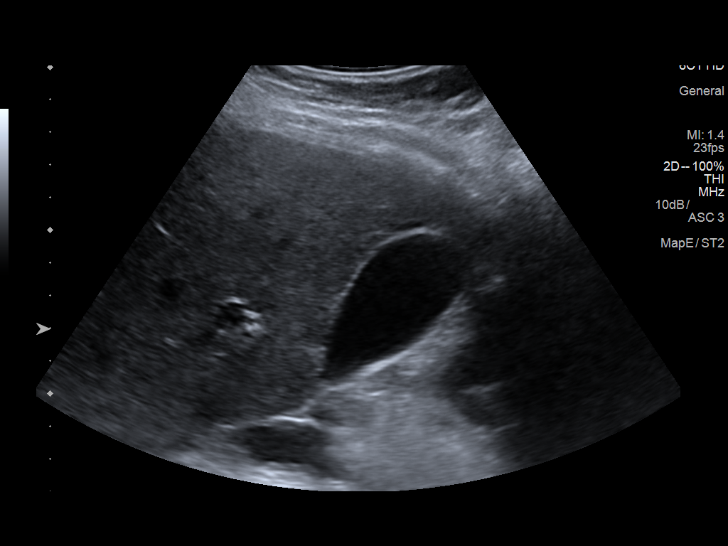
[im 7/83]
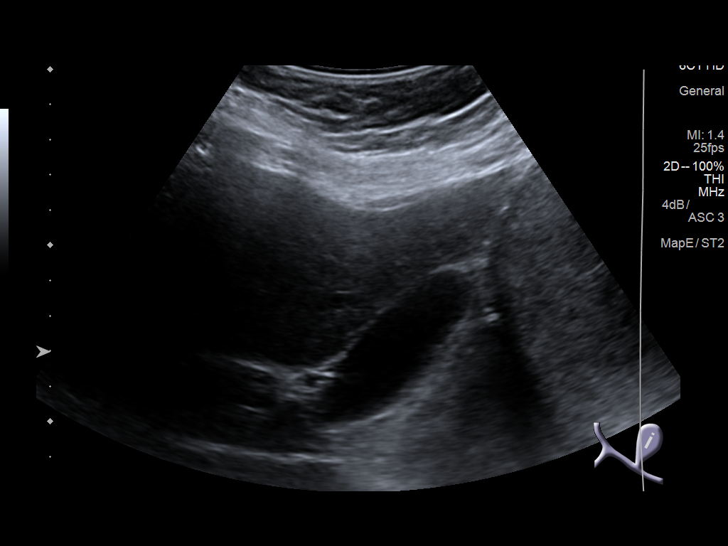
[im 14/83]
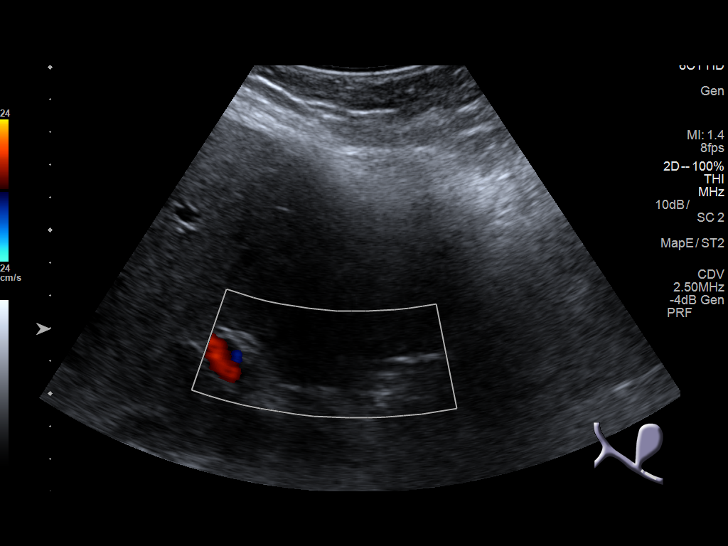
[im 21/83]
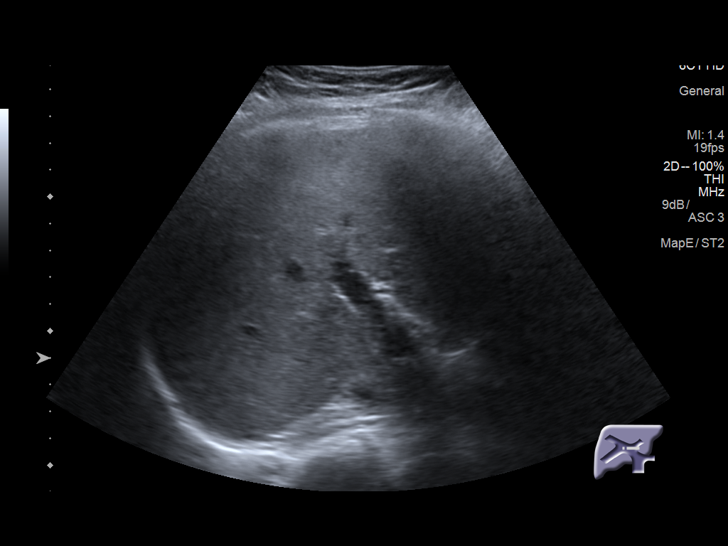
[im 28/83]
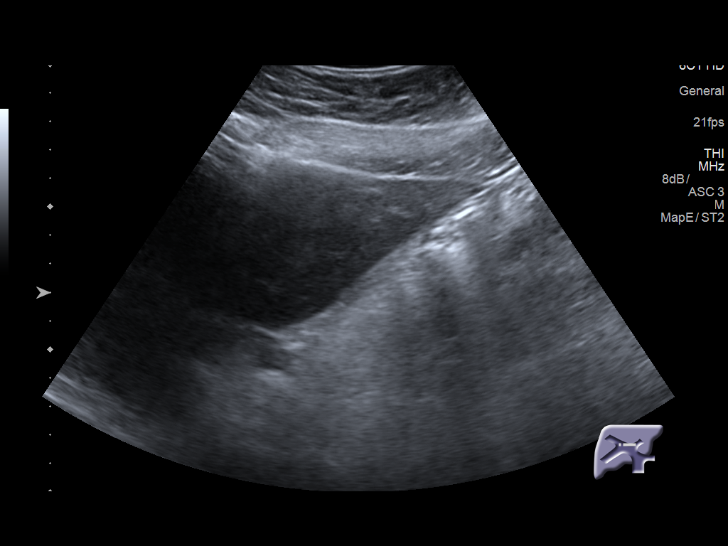
[im 31/83]
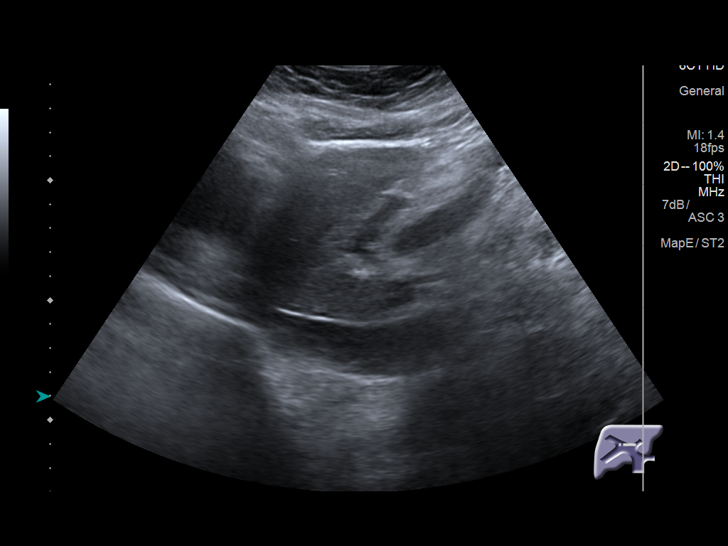
[im 38/83]
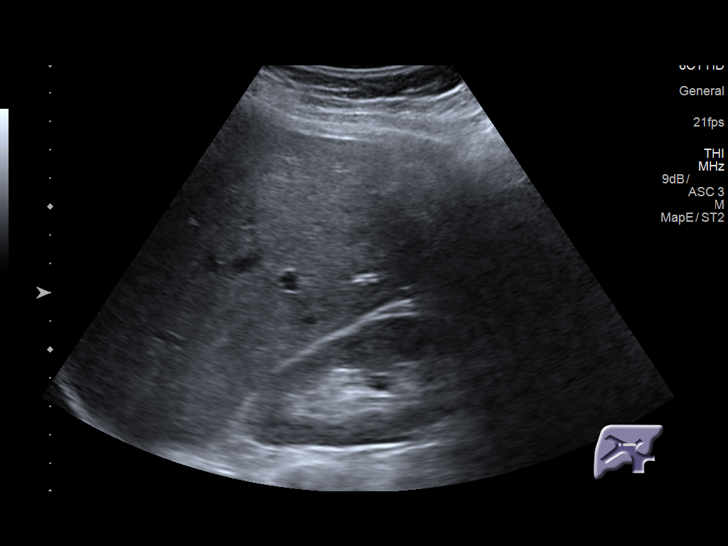
[im 45/83]
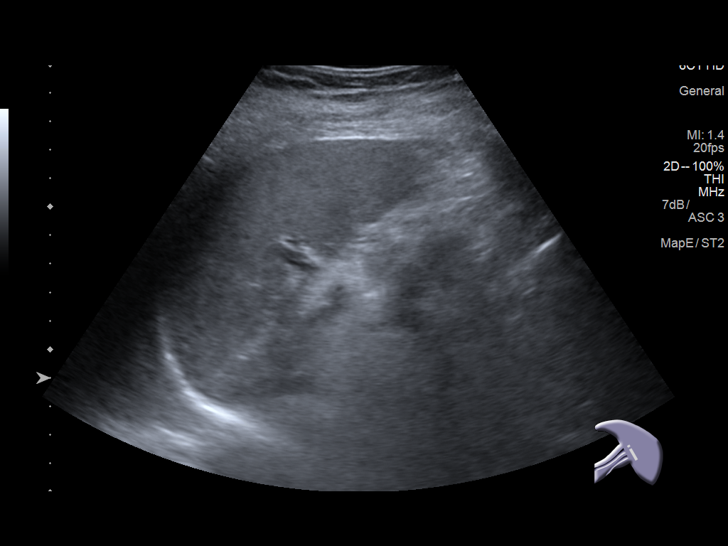
[im 52/83]
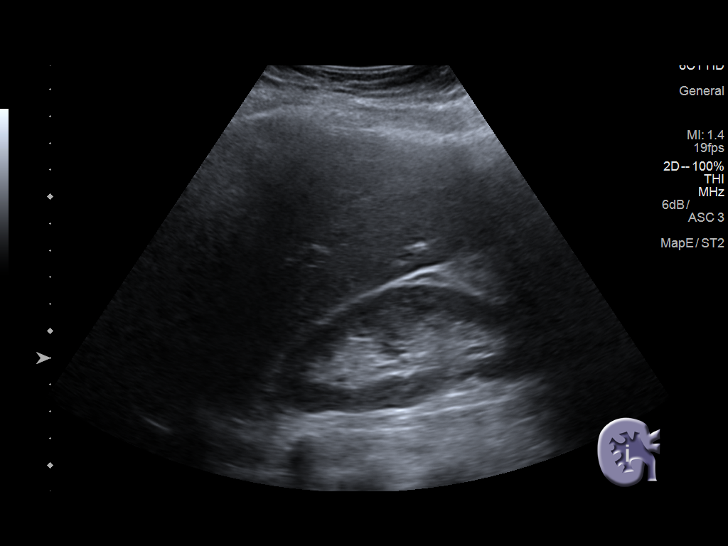
[im 55/83]
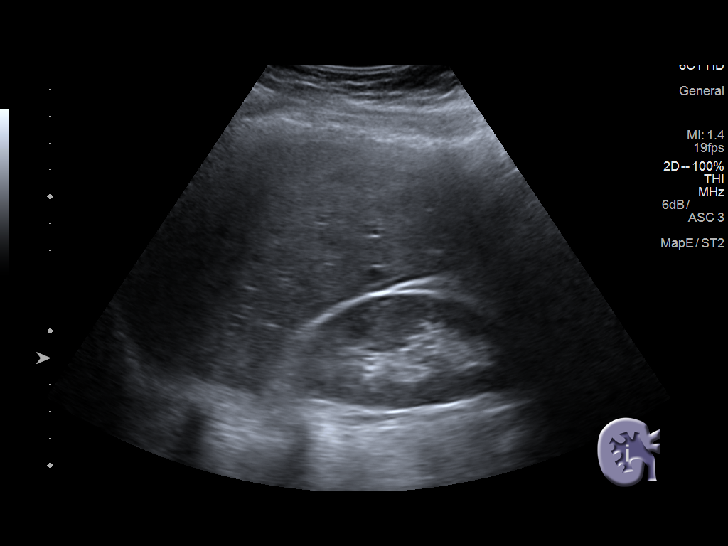
[im 62/83]
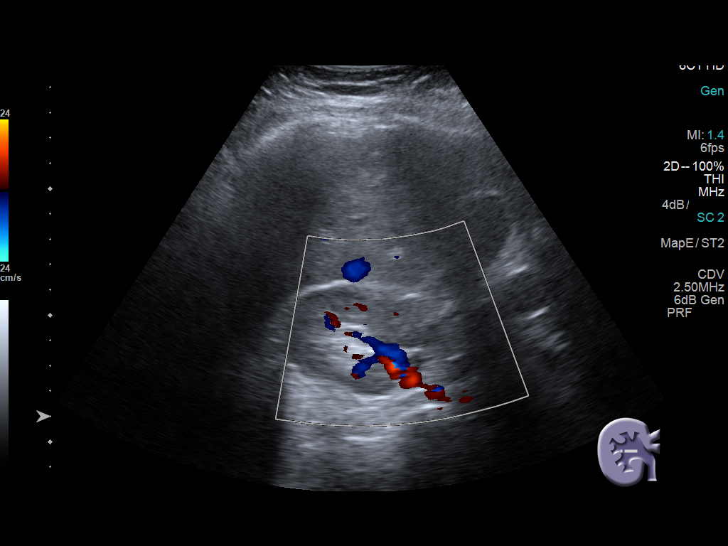
[im 69/83]
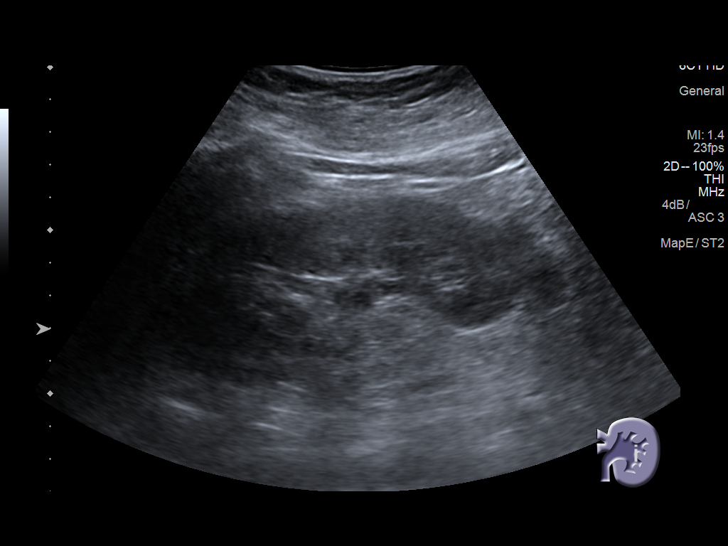
[im 76/83]
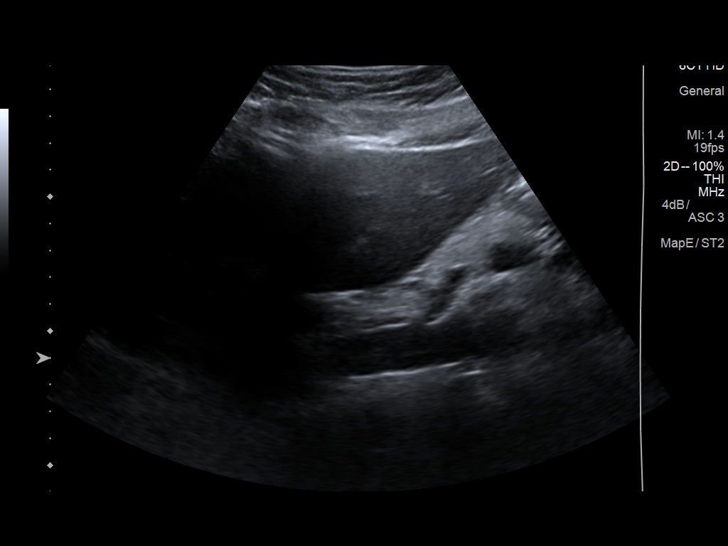
[im 83/83]
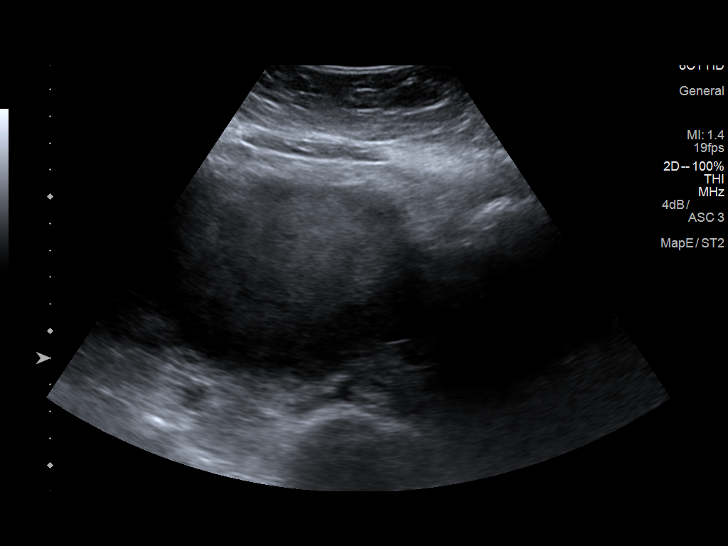

[14 of 25 positions shown; findings below may reference images not displayed]

FINDINGS: Gallbladder: No gallstones or wall thickening visualized. No
sonographic Murphy sign noted by sonographer.

Common bile duct: Diameter: 1.7 mm

Liver: No focal lesion identified. Within normal limits in
parenchymal echogenicity. Portal vein is patent on color Doppler
imaging with normal direction of blood flow towards the liver.

IVC: No abnormality visualized.

Pancreas: The pancreatic head and body are normal in appearance. The
pancreatic tail is obscured by bowel gas.

Spleen: Size and appearance within normal limits.

Right Kidney: Length: 11.2 cm. Echogenicity within normal limits. No
mass or hydronephrosis visualized.

Left Kidney: Length: 12.7 cm. Echogenicity within normal limits. No
mass or hydronephrosis visualized.

Abdominal aorta: No aneurysm visualized.

Other findings: No ascites is observed.
IMPRESSION: No acute intra-abdominal abnormality is observed. Please note the
fetus was not evaluated on this study.

## 2020-04-24 ENCOUNTER — Other Ambulatory Visit: Payer: Self-pay | Admitting: Family Medicine

## 2020-04-24 DIAGNOSIS — N921 Excessive and frequent menstruation with irregular cycle: Secondary | ICD-10-CM

## 2020-05-04 ENCOUNTER — Ambulatory Visit
Admission: RE | Admit: 2020-05-04 | Discharge: 2020-05-04 | Disposition: A | Discharge status: Home / Self Care | Payer: 59 | Source: Ambulatory Visit | Attending: Family Medicine | Admitting: Family Medicine

## 2020-05-04 DIAGNOSIS — N921 Excessive and frequent menstruation with irregular cycle: Secondary | ICD-10-CM

## 2021-06-23 ENCOUNTER — Ambulatory Visit (HOSPITAL_COMMUNITY)
Admission: EM | Admit: 2021-06-23 | Discharge: 2021-06-23 | Disposition: A | Discharge status: Home / Self Care | Payer: PRIVATE HEALTH INSURANCE | Attending: Physician Assistant | Admitting: Physician Assistant

## 2021-06-23 ENCOUNTER — Encounter (HOSPITAL_COMMUNITY): Payer: Self-pay

## 2021-06-23 DIAGNOSIS — G43009 Migraine without aura, not intractable, without status migrainosus: Secondary | ICD-10-CM

## 2021-06-23 MED ORDER — KETOROLAC TROMETHAMINE 60 MG/2ML IM SOLN
INTRAMUSCULAR | Status: AC
  Filled 2021-06-23: qty 2

## 2021-06-23 MED ORDER — ONDANSETRON 4 MG PO TBDP
ORAL_TABLET | ORAL | Status: AC
  Filled 2021-06-23: qty 1

## 2021-06-23 MED ORDER — ONDANSETRON 4 MG PO TBDP
4.0000 mg | ORAL_TABLET | Freq: Once | ORAL | Status: AC
  Administered 2021-06-23: 4 mg via ORAL

## 2021-06-23 MED ORDER — PROMETHAZINE HCL 25 MG PO TABS: 25.0000 mg | ORAL_TABLET | Freq: Three times a day (TID) | ORAL | 0 refills | Status: DC | PRN

## 2021-06-23 MED ORDER — KETOROLAC TROMETHAMINE 60 MG/2ML IM SOLN
60.0000 mg | Freq: Once | INTRAMUSCULAR | Status: AC
  Administered 2021-06-23: 60 mg via INTRAMUSCULAR

## 2021-06-23 MED ORDER — BACLOFEN 10 MG PO TABS: 10.0000 mg | ORAL_TABLET | Freq: Every day | ORAL | 0 refills | Status: DC | PRN

## 2021-06-23 NOTE — ED Triage Notes (Signed)
C/o nausea and vomiting  x 2-3 days. Pt thinks she has a migraine . ?

## 2021-06-23 NOTE — ED Provider Notes (Signed)
?[facility] ? ? ? ?CSN: [id number] ?Arrival date & time: 06/23/21  1736 ? ? ?  ? ?History   ?Chief Complaint ?No chief complaint on file. ? ? ?HPI ?Valerie [name] is a 31 y.o. female.  ? ?Patient presents today with a 2-day history of migraine.  She has a history of migraines since 31 years old and states current symptoms are similar to previous episodes of this condition.  She denies any head injury, recent illness, medication changes.  She has tried Excedrin migraine relief which has provided temporary relief of symptoms.  She reports 1 episode of nausea and vomiting earlier today but has been able to eat and drink after this without recurrent emesis.  She reports headache pain is rated 8 on a 0-10 pain scale, localized to bitemporal region, described as intense pressure/throbbing, no alleviating factors identified.  She denies any aura associated with migraines.  She has been worked up by neurologist including MRIs but reports as she is got older migraines have become infrequent so she is no longer in the care of a specialist.  She reports associated photophobia and phonophobia.  Denies any weakness, dysarthria, visual disturbance, worst headache of her life.  She is confident she is not pregnant.  She is not breast-feeding. ? ? ?Past Medical History:  ?Diagnosis Date  ? Abnormal Pap smear   ? Anemia   ? Anxiety   ? Bell's palsy   ? as a teen; lost some hearing left ear  ? BV (bacterial vaginosis)   ? Depression   ? Eczema   ? First degree heart block   ? Gastric ulcer   ? 2000  ? Heart murmur   ? Infection   ? UTI, chronic sinus inf  ? Migraine   ? Mitral valve regurgitation   ? Ovarian cyst   ? Tricuspid valve regurgitation   ? UTI (urinary tract infection)   ? ? ?Patient Active Problem List  ? Diagnosis Date Noted  ? Postpartum care following vaginal delivery 04/29/2018  ? 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 Hereditary disease in family possibly affecting fetus, antepartum 11/10/2017  ? Palpitations  10/16/2017  ? Cardiac murmur 10/16/2017  ? First degree AV block 10/16/2017  ? Unwanted fertility 10/06/2017  ? Skin tag of perianal region 12/14/2013  ? Depressive disorder, not elsewhere classified 12/14/2013  ? Former smoker, stopped smoking in distant past 11/27/2012  ? Family history of diabetes in pregnancy 11/27/2012  ? History of facial weakness/Neuro workup/Lesion on MRI w/out change 11/27/2012  ? ? ?Past Surgical History:  ?Procedure Laterality Date  ? ADENOIDECTOMY    ? COLPOSCOPY    ? CRYOTHERAPY    ? LEEP    ? MYRINGOTOMY WITH TUBE PLACEMENT    ? TUBAL LIGATION N/A 04/01/2018  ? Procedure: POST PARTUM TUBAL LIGATION;  Surgeon: Kathrynn RunningWouk, Noah Bedford, MD;  [facility];  Service: Gynecology;  Laterality: N/A;  ? ? ?OB History   ? ? Gravida  ?4  ? Para  ?2  ? Term  ?2  ? Preterm  ?   ? AB  ?2  ? Living  ?2  ?  ? ? SAB  ?2  ? IAB  ?   ? Ectopic  ?   ? Multiple  ?0  ? Live Births  ?2  ?   ?  ?  ? ? ? ?Home Medications   ? ?Prior to Admission medications   ?Medication Sig Start Date End Date Taking?  Authorizing Provider  ?baclofen (LIORESAL) 10 MG tablet Take 1 tablet (10 mg total) by mouth daily as needed for muscle spasms. 06/23/21  Yes Darenda Fike, Noberto Retort, PA-C  ?promethazine (PHENERGAN) 25 MG tablet Take 1 tablet (25 mg total) by mouth every 8 (eight) hours as needed for nausea or vomiting. 06/23/21  Yes Vickey Boak, Noberto Retort, PA-C  ?Menthol, Topical Analgesic, (BIOFREEZE) 4 % GEL Apply 1 application topically 4 (four) times daily as needed (For back pain.).    [provider]  ?nebivolol (BYSTOLIC) 5 MG tablet Take 0.5 tablets (2.5 mg total) by mouth daily. May increase to one whole tablet (5 mg)  if needed. 04/20/19   Revankar, Aundra Dubin, MD  ?Prenatal Vit-Fe Fumarate-FA (PRENATAL MULTIVITAMIN) TABS tablet Take 1 tablet by mouth daily at 12 noon.    [provider]  ? ? ?Family History ?Family History  ?Problem Relation Age of Onset  ? Hypertension Mother   ? Diabetes Father   ? Hypertension  Father   ? Cancer Paternal Aunt   ?     female  ? Diabetes Paternal Uncle   ? Diabetes Paternal Grandmother   ? Hypertension Paternal Grandmother   ? Diabetes Paternal Grandfather   ? Hypertension Paternal Grandfather   ? ? ?Social History ?Social History  ? ?Tobacco Use  ? Smoking status: Every Day  ?  Packs/day: 1.00  ?  Types: Cigarettes  ?  Last attempt to quit: 08/05/2017  ?  Years since quitting: 3.8  ? Smokeless tobacco: Never  ?Vaping Use  ? Vaping Use: Former  ?Substance Use Topics  ? Alcohol use: Yes  ?  Comment: rarely  ? Drug use: No  ? ? ? ?Allergies   ?Augmentin [amoxicillin-pot clavulanate], Ciprofloxacin, Doxycycline, Hydrocodone, Prednisone, and Raspberry ? ? ?Review of Systems ?Review of Systems  ?Constitutional:  Positive for activity change. Negative for appetite change, fatigue and fever.  ?Eyes:  Positive for photophobia. Negative for visual disturbance.  ?Gastrointestinal:  Positive for nausea and vomiting. Negative for abdominal pain and diarrhea.  ?Neurological:  Positive for headaches. Negative for dizziness, syncope, facial asymmetry, speech difficulty, weakness, light-headedness and numbness.  ? ? ?Physical Exam ?Triage Vital Signs ?ED Triage Vitals [06/23/21 1812]  ?Enc Vitals Group  ?   BP   ?   Pulse Rate (!) 1  ?   Resp 16  ?   Temp 98.4 ?F (36.9 ?C)  ?   Temp Source Oral  ?   SpO2 99 %  ?   Weight   ?   Height   ?   Head Circumference   ?   Peak Flow   ?   Pain Score   ?   Pain Loc   ?   Pain Edu?   ?   Excl. in GC?   ? ?No data found. ? ?Updated Vital Signs ?BP 122/75   Pulse 98   Temp 99 ?F (37.2 ?C)   Resp 20   LMP 06/22/2021 (Approximate)   SpO2 97%  ? ?Visual Acuity ?Right Eye Distance:   ?Left Eye Distance:   ?Bilateral Distance:   ? ?Right Eye Near:   ?Left Eye Near:    ?Bilateral Near:    ? ?Physical Exam ?Vitals reviewed.  ?Constitutional:   ?   General: She is awake. She is not in acute distress. ?   Appearance: Normal appearance. She is well-developed. She is not  ill-appearing.  ?   Comments: Very pleasant female appears stated  age in no acute distress sitting comfortably in exam room  ?HENT:  ?   Head: Normocephalic and atraumatic. No raccoon eyes, Battle's sign or contusion.  ?   Right Ear: Tympanic membrane, ear canal and external ear normal. No hemotympanum.  ?   Left Ear: Tympanic membrane, ear canal and external ear normal. No hemotympanum.  ?   Mouth/Throat:  ?   Tongue: Tongue does not deviate from midline.  ?   Pharynx: Uvula midline.  ?Eyes:  ?   Extraocular Movements: Extraocular movements intact.  ?   Conjunctiva/sclera: Conjunctivae normal.  ?   Pupils: Pupils are equal, round, and reactive to light.  ?Cardiovascular:  ?   Rate and Rhythm: Normal rate and regular rhythm.  ?   Heart sounds: Normal heart sounds, S1 normal and S2 normal. No murmur heard. ?Pulmonary:  ?   Effort: Pulmonary effort is normal.  ?   Breath sounds: Normal breath sounds. No wheezing, rhonchi or rales.  ?   Comments: Clear to auscultation bilaterally ?Abdominal:  ?   General: Bowel sounds are normal.  ?   Palpations: Abdomen is soft.  ?   Tenderness: There is no abdominal tenderness.  ?Musculoskeletal:  ?   Cervical back: Normal range of motion and neck supple. No spinous process tenderness or muscular tenderness.  ?   Comments: Strength 5/5 bilateral upper and lower extremities  ?Lymphadenopathy:  ?   Head:  ?   Right side of head: No submental, submandibular or tonsillar adenopathy.  ?   Left side of head: No submental, submandibular or tonsillar adenopathy.  ?Neurological:  ?   General: No focal deficit present.  ?   Mental Status: She is alert.  ?   Cranial Nerves: Cranial nerves 2-12 are intact.  ?   Motor: Motor function is intact.  ?   Coordination: Coordination is intact.  ?   Gait: Gait is intact.  ?Psychiatric:     ?   Behavior: Behavior is cooperative.  ? ? ? ?UC Treatments / Results  ?Labs ?(all labs ordered are listed, but only abnormal results are displayed) ?Labs Reviewed -  No data to display ? ?EKG ? ? ?Radiology ?No results found. ? ?Procedures ?Procedures (including critical care time) ? ?Medications Ordered in UC ?Medications  ?ondansetron (ZOFRAN-ODT) disintegrating tablet 4

## 2021-06-23 NOTE — Discharge Instructions (Signed)
We gave you an injection of Toradol today.  Please do not take NSAIDs for 12 hours.  This includes aspirin, ibuprofen/Advil, naproxen/Aleve.  We also gave you a dose of Zofran.  You can use Phenergan that was called into the pharmacy for additional nausea relief.  This will make you sleepy so do not drive or drink alcohol while taking it.  I have also called in baclofen which is a muscle relaxer that can help with headaches.  This will also make you sleepy so do not drive or drink alcohol with it.  Use heat, rest, massage for symptom relief.  If you have any severe headache, nausea/vomiting not responding to medication, weakness, confusion, difficulty speaking you need to be seen immediately in the emergency room. ?

## 2021-09-25 ENCOUNTER — Emergency Department (HOSPITAL_BASED_OUTPATIENT_CLINIC_OR_DEPARTMENT_OTHER)
Admission: EM | Admit: 2021-09-25 | Discharge: 2021-09-25 | Disposition: A | Discharge status: Home / Self Care | Payer: Medicaid Other | Attending: Emergency Medicine | Admitting: Emergency Medicine

## 2021-09-25 ENCOUNTER — Other Ambulatory Visit: Payer: Self-pay

## 2021-09-25 ENCOUNTER — Emergency Department (HOSPITAL_BASED_OUTPATIENT_CLINIC_OR_DEPARTMENT_OTHER): Payer: Medicaid Other

## 2021-09-25 ENCOUNTER — Encounter (HOSPITAL_BASED_OUTPATIENT_CLINIC_OR_DEPARTMENT_OTHER): Payer: Self-pay

## 2021-09-25 DIAGNOSIS — R112 Nausea with vomiting, unspecified: Secondary | ICD-10-CM | POA: Insufficient documentation

## 2021-09-25 DIAGNOSIS — R1030 Lower abdominal pain, unspecified: Secondary | ICD-10-CM

## 2021-09-25 DIAGNOSIS — R1031 Right lower quadrant pain: Secondary | ICD-10-CM | POA: Insufficient documentation

## 2021-09-25 DIAGNOSIS — N390 Urinary tract infection, site not specified: Secondary | ICD-10-CM | POA: Diagnosis not present

## 2021-09-25 LAB — CBC WITH DIFFERENTIAL/PLATELET
Abs Immature Granulocytes: 0.01 10*3/uL (ref 0.00–0.07)
Basophils Absolute: 0.1 10*3/uL (ref 0.0–0.1)
Basophils Relative: 1 %
Eosinophils Absolute: 0.4 10*3/uL (ref 0.0–0.5)
Eosinophils Relative: 4 %
HCT: 38.3 % (ref 36.0–46.0)
Hemoglobin: 12.5 g/dL (ref 12.0–15.0)
Immature Granulocytes: 0 %
Lymphocytes Relative: 33 %
Lymphs Abs: 2.7 10*3/uL (ref 0.7–4.0)
MCH: 30.9 pg (ref 26.0–34.0)
MCHC: 32.6 g/dL (ref 30.0–36.0)
MCV: 94.8 fL (ref 80.0–100.0)
Monocytes Absolute: 0.6 10*3/uL (ref 0.1–1.0)
Monocytes Relative: 8 %
Neutro Abs: 4.3 10*3/uL (ref 1.7–7.7)
Neutrophils Relative %: 54 %
Platelets: 182 10*3/uL (ref 150–400)
RBC: 4.04 MIL/uL (ref 3.87–5.11)
RDW: 13.9 % (ref 11.5–15.5)
WBC: 8 10*3/uL (ref 4.0–10.5)
nRBC: 0 % (ref 0.0–0.2)

## 2021-09-25 LAB — COMPREHENSIVE METABOLIC PANEL
ALT: 22 U/L (ref 0–44)
AST: 17 U/L (ref 15–41)
Albumin: 4.2 g/dL (ref 3.5–5.0)
Alkaline Phosphatase: 114 U/L (ref 38–126)
Anion gap: 11 (ref 5–15)
BUN: 13 mg/dL (ref 6–20)
CO2: 23 mmol/L (ref 22–32)
Calcium: 9.4 mg/dL (ref 8.9–10.3)
Chloride: 107 mmol/L (ref 98–111)
Creatinine, Ser: 0.72 mg/dL (ref 0.44–1.00)
GFR, Estimated: >60 mL/min (ref >60)
Glucose, Bld: 99 mg/dL (ref 70–99)
Potassium: 4.1 mmol/L (ref 3.5–5.1)
Sodium: 141 mmol/L (ref 135–145)
Total Bilirubin: 0.2 mg/dL — ABNORMAL LOW (ref 0.3–1.2)
Total Protein: 7 g/dL (ref 6.5–8.1)

## 2021-09-25 LAB — URINALYSIS, ROUTINE W REFLEX MICROSCOPIC
Bilirubin Urine: NEGATIVE
Glucose, UA: NEGATIVE mg/dL
Ketones, ur: NEGATIVE mg/dL
Leukocytes,Ua: NEGATIVE
Nitrite: NEGATIVE
Specific Gravity, Urine: 1.032 — ABNORMAL HIGH (ref 1.005–1.030)
pH: 5.5 (ref 5.0–8.0)

## 2021-09-25 LAB — LIPASE, BLOOD: Lipase: 41 U/L (ref 11–51)

## 2021-09-25 LAB — HCG, SERUM, QUALITATIVE: Preg, Serum: NEGATIVE

## 2021-09-25 MED ORDER — ONDANSETRON HCL 4 MG/2ML IJ SOLN
4.0000 mg | Freq: Once | INTRAMUSCULAR | Status: AC
Start: 2021-09-25 — End: 2021-09-25
  Administered 2021-09-25: 4 mg via INTRAVENOUS
  Filled 2021-09-25: qty 2

## 2021-09-25 MED ORDER — ONDANSETRON HCL 4 MG/2ML IJ SOLN
4.0000 mg | Freq: Once | INTRAMUSCULAR | Status: AC
  Administered 2021-09-25: 4 mg via INTRAVENOUS
  Filled 2021-09-25: qty 2

## 2021-09-25 MED ORDER — OXYCODONE-ACETAMINOPHEN 5-325 MG PO TABS: 1.0000 | ORAL_TABLET | ORAL | 0 refills | Status: DC | PRN

## 2021-09-25 MED ORDER — ONDANSETRON HCL 4 MG PO TABS: 4.0000 mg | ORAL_TABLET | Freq: Three times a day (TID) | ORAL | 0 refills | Status: DC | PRN

## 2021-09-25 MED ORDER — IOHEXOL 300 MG/ML  SOLN
100.0000 mL | Freq: Once | INTRAMUSCULAR | Status: AC | PRN
Start: 2021-09-25 — End: 2021-09-25
  Administered 2021-09-25: 85 mL via INTRAVENOUS

## 2021-09-25 MED ORDER — LACTATED RINGERS IV BOLUS
1000.0000 mL | Freq: Once | INTRAVENOUS | Status: AC
  Administered 2021-09-25: 1000 mL via INTRAVENOUS

## 2021-09-25 MED ORDER — MORPHINE SULFATE (PF) 4 MG/ML IV SOLN
4.0000 mg | Freq: Once | INTRAVENOUS | Status: AC
  Administered 2021-09-25: 4 mg via INTRAVENOUS
  Filled 2021-09-25: qty 1

## 2021-09-25 MED ORDER — OXYCODONE-ACETAMINOPHEN 5-325 MG PO TABS
1.0000 | ORAL_TABLET | Freq: Once | ORAL | Status: AC
  Administered 2021-09-25: 1 via ORAL
  Filled 2021-09-25: qty 1

## 2021-09-25 NOTE — ED Provider Notes (Signed)
7:16 AM Care assumed from Dr. Preston Fleeting.  At time of transfer of care, patient is going to be reassessed with a plan for likely discharge home as her CT scan just returned showing no acute abnormality.  Previous team suspected this was likely menstrual cramping causing her discomfort now that CT imaging is reassuring.  Anticipate discharge home with outpatient follow-up.  Patient reassessed and she still having some nausea and abdominal discomfort.  We agreed together to give her a pain pill and some IV nausea medicine and then p.o. challenge.  After she passes, she will be discharged home for outpatient follow-up and she agrees.  Patient will be discharged.  Clinical Impression: 1. Lower abdominal pain     Disposition: Discharge  Condition: Good  I have discussed the results, Dx and Tx plan with the pt(& family if present). He/she/they expressed understanding and agree(s) with the plan. Discharge instructions discussed at great length. Strict return precautions discussed and pt &/or family have verbalized understanding of the instructions. No further questions at time of discharge.    New Prescriptions   ONDANSETRON (ZOFRAN) 4 MG TABLET    Take 1 tablet (4 mg total) by mouth every 8 (eight) hours as needed for nausea or vomiting.   OXYCODONE-ACETAMINOPHEN (PERCOCET/ROXICET) 5-325 MG TABLET    Take 1 tablet by mouth every 4 (four) hours as needed for severe pain.    Follow Up: Soundra Pilon, FNP [address] [place] [address] [phone]     [facility] [address] [place] [address] [phone]       Acire Tang, Canary Brim, MD 09/25/21 1527

## 2021-09-25 NOTE — ED Provider Notes (Signed)
[facility]PT Provider Note   CSN: [id number] Arrival date & time: 09/25/21  0451     History  Chief Complaint  Patient presents with   Abdominal Pain    Valerie [name] is a 31 y.o. female.  The history is provided by the patient.  Abdominal Pain She is status post tubal ligation and comes in because of severe right lower quadrant pain which started tonight.  She states that her menses started tonight and she thought that she was having typical menstrual cramps when pain got much more severe.  Pain does radiate into her back.  There is associated nausea and vomiting, but pain did not improve after vomiting.  Vomiting has eased but pain is still present and severe.  She took a a dose of ibuprofen after she stopped vomiting, but it has not helped.  She denies fever or chills.   Home Medications Prior to Admission medications   Medication Sig Start Date End Date Taking? Authorizing Provider  baclofen (LIORESAL) 10 MG tablet Take 1 tablet (10 mg total) by mouth daily as needed for muscle spasms. 06/23/21   Raspet, Noberto Retort, PA-C  Menthol, Topical Analgesic, (BIOFREEZE) 4 % GEL Apply 1 application topically 4 (four) times daily as needed (For back pain.).    [provider]  nebivolol (BYSTOLIC) 5 MG tablet Take 0.5 tablets (2.5 mg total) by mouth daily. May increase to one whole tablet (5 mg)  if needed. 04/20/19   Revankar, Aundra Dubin, MD  Prenatal Vit-Fe Fumarate-FA (PRENATAL MULTIVITAMIN) TABS tablet Take 1 tablet by mouth daily at 12 noon.    [provider]  promethazine (PHENERGAN) 25 MG tablet Take 1 tablet (25 mg total) by mouth every 8 (eight) hours as needed for nausea or vomiting. 06/23/21   Raspet, Noberto Retort, PA-C      Allergies    Augmentin [amoxicillin-pot clavulanate], Ciprofloxacin, Doxycycline, Hydrocodone, Prednisone, and Raspberry    Review of Systems   Review of Systems  Gastrointestinal:  Positive for abdominal pain.  All other  systems reviewed and are negative.   Physical Exam Updated Vital Signs BP 120/83 (BP Location: Left Arm)   Pulse 73   Temp 98.1 F (36.7 C) (Oral)   Resp 18   Ht 5\' 5"  (1.651 m)   Wt 99.8 kg   LMP 09/25/2021   SpO2 98%   BMI 36.61 kg/m  Physical Exam Vitals and nursing note reviewed.   31 year old female, appears uncomfortable, but is in no acute distress. Vital signs are normal. Oxygen saturation is 98%, which is normal. Head is normocephalic and atraumatic. PERRLA, EOMI. Oropharynx is clear. Neck is nontender and supple without adenopathy or JVD. Back is nontender in the midline.  There is moderate right CVA tenderness. Lungs are clear without rales, wheezes, or rhonchi. Chest is nontender. Heart has regular rate and rhythm without murmur. Abdomen is soft, flat, with moderate right lower quadrant tenderness which is poorly localized.  There is no rebound or guarding.  Peristalsis is diminished. Extremities have no cyanosis or edema, full range of motion is present. Skin is warm and dry without rash. Neurologic: Mental status is normal, cranial nerves are intact, moves all extremities equally.  ED Results / Procedures / Treatments   Labs (all labs ordered are listed, but only abnormal results are displayed) Labs Reviewed - No data to display  Radiology No results found.  Procedures Procedures    Medications Ordered in ED Medications - No data  to display  ED Course/ Medical Decision Making/ A&P                           Medical Decision Making Amount and/or Complexity of Data Reviewed Labs: ordered. Radiology: ordered.  Risk Prescription drug management.   Right lower quadrant pain of uncertain cause.  Differential is extensive and includes, but is not limited to, ovarian cyst, ruptured ectopic pregnancy, appendicitis, pyelonephritis, urolithiasis, diverticulitis, ovarian torsion.  I have ordered CBC, comprehensive metabolic panel, lipase, urinalysis,  pregnancy test.  I have ordered IV fluids, morphine, ondansetron.  I have ordered CT of abdomen and pelvis to rule out appendicitis.  CT scan has been completed and I have reviewed the images and do not see any evidence of any serious intra-abdominal pathology, radiologist interpretation is pending.  Case is signed out to Dr. Rush Landmark.  Final Clinical Impression(s) / ED Diagnoses Final diagnoses:  Lower abdominal pain    Rx / DC Orders ED Discharge Orders     None         Dione Booze, MD 09/25/21 [phone]

## 2021-09-25 NOTE — Discharge Instructions (Signed)
Your history, exam, work-up today did not reveal a surgical or emergent cause of your abdominal pain today.  The CT scan was completely reassuring as were your labs and urine.  We feel you are safe for discharge home.  Please rest and stay hydrated and use the pain and nausea medicine to help with discomfort.  Please follow-up with your primary doctor.  If any symptoms change or worsen acutely, please return to the nearest emergency department.

## 2021-09-25 NOTE — ED Triage Notes (Addendum)
Pt via POV c/o acute onset of severe sharp RLQ abdominal pain that started this morning States currently menstruating with heavy bleeding, which is normal for her. Pain associated with vomiting x2. Hx of tubal ligation. Denies urinary problems. No GI surgeries.

## 2022-01-09 ENCOUNTER — Encounter (HOSPITAL_BASED_OUTPATIENT_CLINIC_OR_DEPARTMENT_OTHER): Payer: Self-pay | Admitting: *Deleted

## 2022-01-09 ENCOUNTER — Other Ambulatory Visit: Payer: Self-pay

## 2022-01-09 DIAGNOSIS — R519 Headache, unspecified: Secondary | ICD-10-CM | POA: Diagnosis present

## 2022-01-09 DIAGNOSIS — Z5321 Procedure and treatment not carried out due to patient leaving prior to being seen by health care provider: Secondary | ICD-10-CM | POA: Diagnosis not present

## 2022-01-09 DIAGNOSIS — G43909 Migraine, unspecified, not intractable, without status migrainosus: Secondary | ICD-10-CM | POA: Insufficient documentation

## 2022-01-09 MED ORDER — ONDANSETRON 4 MG PO TBDP
4.0000 mg | ORAL_TABLET | Freq: Once | ORAL | Status: AC
Start: 2022-01-09 — End: 2022-01-09
  Administered 2022-01-09: 4 mg via ORAL
  Filled 2022-01-09: qty 1

## 2022-01-09 NOTE — ED Triage Notes (Addendum)
Pt c/o of migraine that started 4 hours ago. States hx of migraine headaches. Was seen yesterday for an ear infection and is on antibiotics. States this is her typical migraine. States posterior headache, but radiates to front. Rates pain 9/10. Took excedrin 4 hours ago with little relief. C/o nausea.

## 2022-01-10 ENCOUNTER — Emergency Department (HOSPITAL_BASED_OUTPATIENT_CLINIC_OR_DEPARTMENT_OTHER)
Admission: EM | Admit: 2022-01-10 | Discharge: 2022-01-10 | Discharge status: Against Medical Advice | Payer: Medicaid Other | Attending: Emergency Medicine | Admitting: Emergency Medicine

## 2022-01-10 NOTE — ED Notes (Signed)
Called pt's name in lobby x3 for room assignment. Unable to locate pt.

## 2022-01-26 ENCOUNTER — Other Ambulatory Visit: Payer: Self-pay

## 2022-01-26 ENCOUNTER — Emergency Department (HOSPITAL_BASED_OUTPATIENT_CLINIC_OR_DEPARTMENT_OTHER): Payer: PRIVATE HEALTH INSURANCE | Admitting: Radiology

## 2022-01-26 ENCOUNTER — Encounter (HOSPITAL_BASED_OUTPATIENT_CLINIC_OR_DEPARTMENT_OTHER): Payer: Self-pay

## 2022-01-26 ENCOUNTER — Emergency Department (HOSPITAL_BASED_OUTPATIENT_CLINIC_OR_DEPARTMENT_OTHER)
Admission: EM | Admit: 2022-01-26 | Discharge: 2022-01-26 | Disposition: A | Discharge status: Home / Self Care | Payer: PRIVATE HEALTH INSURANCE | Attending: Emergency Medicine | Admitting: Emergency Medicine

## 2022-01-26 DIAGNOSIS — W010XXA Fall on same level from slipping, tripping and stumbling without subsequent striking against object, initial encounter: Secondary | ICD-10-CM | POA: Insufficient documentation

## 2022-01-26 DIAGNOSIS — Y93K1 Activity, walking an animal: Secondary | ICD-10-CM | POA: Insufficient documentation

## 2022-01-26 DIAGNOSIS — M7989 Other specified soft tissue disorders: Secondary | ICD-10-CM | POA: Insufficient documentation

## 2022-01-26 DIAGNOSIS — S6991XA Unspecified injury of right wrist, hand and finger(s), initial encounter: Secondary | ICD-10-CM | POA: Diagnosis present

## 2022-01-26 DIAGNOSIS — S40011A Contusion of right shoulder, initial encounter: Secondary | ICD-10-CM | POA: Diagnosis not present

## 2022-01-26 DIAGNOSIS — S63501A Unspecified sprain of right wrist, initial encounter: Secondary | ICD-10-CM | POA: Insufficient documentation

## 2022-01-26 MED ORDER — OXYCODONE-ACETAMINOPHEN 5-325 MG PO TABS
1.0000 | ORAL_TABLET | Freq: Once | ORAL | Status: AC
  Administered 2022-01-26: 1 via ORAL
  Filled 2022-01-26: qty 1

## 2022-01-26 NOTE — ED Triage Notes (Signed)
Pt states about an hour ago she tripped and fell while trying to get her dog inside. Pt landed on her right side. C/o pain to right wrist and right shoulder. Pt denies any other injuries.

## 2022-01-26 NOTE — Discharge Instructions (Addendum)
We saw in the emergency room for shoulder, wrist pain.  The x-ray does not show any fracture. You might have sprain or a hairline fracture that is being missed on the x-ray initially.  Recommend that you apply the wrist brace as prescribed.  Ice the areas of discomfort 4 times a day for 15 to 20 minutes each.  Take ibuprofen for pain control.  Please follow-up with the orthopedic surgeons if your symptoms not improving in 1 week.

## 2022-01-27 NOTE — ED Provider Notes (Signed)
[facility] EMERGENCY DEPT Provider Note   CSN: [id number] Arrival date & time: 01/26/22  2706     History  Chief Complaint  Patient presents with   Valerie [name] is a 31 y.o. female.  HPI     31 year old female comes in with chief complaint of fall. Patient tripped and fell about an hour prior to ED arrival.  She was walking her dog, and tripped.  She had a FOOSH mechanism injury, with impact to the right wrist and right shoulder.  Patient denies injury elsewhere.  She is not on any blood thinners.  Home Medications Prior to Admission medications   Medication Sig Start Date End Date Taking? Authorizing Provider  baclofen (LIORESAL) 10 MG tablet Take 1 tablet (10 mg total) by mouth daily as needed for muscle spasms. 06/23/21   Raspet, Noberto Retort, PA-C  buPROPion (WELLBUTRIN) 75 MG tablet Take by mouth.    [provider]  ipratropium (ATROVENT) 0.06 % nasal spray Place into the nose. 01/08/22 01/12/22  [provider]  Menthol, Topical Analgesic, (BIOFREEZE) 4 % GEL Apply 1 application topically 4 (four) times daily as needed (For back pain.).    [provider]  nebivolol (BYSTOLIC) 5 MG tablet Take 0.5 tablets (2.5 mg total) by mouth daily. May increase to one whole tablet (5 mg)  if needed. 04/20/19   Revankar, Aundra Dubin, MD  valACYclovir (VALTREX) 1000 MG tablet TAKE 1 TABLET BY MOUTH EVERY 8 HOURS FOR 10 DAYS 04/24/19   [provider]      Allergies    Augmentin [amoxicillin-pot clavulanate], Ciprofloxacin, Doxycycline, Hydrocodone, Prednisone, and Raspberry    Review of Systems   Review of Systems  Physical Exam Updated Vital Signs BP (!) 131/91   Pulse 77   Temp 98.3 F (36.8 C)   Resp 17   Ht 5\' 5"  (1.651 m)   Wt 97.5 kg   LMP 01/03/2022 (Exact Date)   SpO2 100%   BMI 35.78 kg/m  Physical Exam Vitals and nursing note reviewed.  Constitutional:      Appearance: She is well-developed.  HENT:     Head:  Atraumatic.  Cardiovascular:     Rate and Rhythm: Normal rate.  Pulmonary:     Effort: Pulmonary effort is normal.  Musculoskeletal:        General: Swelling and tenderness present. No deformity.     Cervical back: Normal range of motion and neck supple.     Comments: Patient's right wrist is edematous and there is tenderness to palpation. Patient also has tenderness around the scapular region.  Patient is able to range her shoulder without significant discomfort and flex and extend over the ankle.  Patient also able to pronate and supinate.  Skin:    General: Skin is warm and dry.  Neurological:     Mental Status: She is alert and oriented to person, place, and time.     ED Results / Procedures / Treatments   Labs (all labs ordered are listed, but only abnormal results are displayed) Labs Reviewed - No data to display  EKG None  Radiology DG Shoulder Right  Result Date: 01/26/2022 CLINICAL DATA:  Tripped and fell while on walk with dog, pain to right wrist and right shoulder EXAM: RIGHT SHOULDER - 3 mVIEW COMPARISON:  None Available. FINDINGS: There is no evidence of fracture or dislocation. There is no evidence of arthropathy or other focal bone abnormality. Soft tissues are unremarkable. IMPRESSION:  Negative. Electronically Signed   By: Jacob Moores M.D.   On: 01/26/2022 10:09   DG Wrist Complete Right  Result Date: 01/26/2022 CLINICAL DATA:  31 year old female status post trip and fall.  Pain. EXAM: RIGHT WRIST - COMPLETE 3+ VIEW COMPARISON:  Right wrist series 07/07/2010. FINDINGS: Bone mineralization is within normal limits. There is no evidence of fracture or dislocation. There is no evidence of arthropathy or other focal bone abnormality. No discrete soft tissue injury. IMPRESSION: Negative. Electronically Signed   By: Odessa Fleming M.D.   On: 01/26/2022 10:09    Procedures Procedures    Medications Ordered in ED Medications  oxyCODONE-acetaminophen (PERCOCET/ROXICET)  5-325 MG per tablet 1 tablet (1 tablet Oral Given 01/26/22 1148)    ED Course/ Medical Decision Making/ A&P                           Medical Decision Making Amount and/or Complexity of Data Reviewed Radiology: ordered.  Risk Prescription drug management.   31 year old patient comes in with chief complaint of mechanical fall. FOOSH mechanism injury.  Differential diagnosis includes wrist fracture, elbow fracture, shoulder fracture, clavicular fracture. She also has scapular tenderness, but unlikely to be a scapular fracture given the mechanism.  X-ray of the shoulder and wrist ordered.  Independently interpreted.  X-ray does not show any evidence of fracture.  We will put her in a wrist brace.  Advised RICE treatment and follow-up with PCP.  Final Clinical Impression(s) / ED Diagnoses Final diagnoses:  Wrist sprain, right, initial encounter  Contusion of right shoulder, initial encounter    Rx / DC Orders ED Discharge Orders     None         Derwood Kaplan, MD 01/27/22 1339

## 2022-06-20 DIAGNOSIS — R3915 Urgency of urination: Secondary | ICD-10-CM | POA: Diagnosis not present

## 2022-09-09 DIAGNOSIS — G43911 Migraine, unspecified, intractable, with status migrainosus: Secondary | ICD-10-CM | POA: Diagnosis not present

## 2022-09-09 DIAGNOSIS — R110 Nausea: Secondary | ICD-10-CM | POA: Diagnosis not present

## 2022-12-06 ENCOUNTER — Other Ambulatory Visit: Payer: Self-pay

## 2022-12-06 ENCOUNTER — Emergency Department (HOSPITAL_BASED_OUTPATIENT_CLINIC_OR_DEPARTMENT_OTHER)
Admission: EM | Admit: 2022-12-06 | Discharge: 2022-12-07 | Disposition: A | Discharge status: Home / Self Care | Payer: Medicaid Other | Attending: Emergency Medicine | Admitting: Emergency Medicine

## 2022-12-06 ENCOUNTER — Encounter (HOSPITAL_BASED_OUTPATIENT_CLINIC_OR_DEPARTMENT_OTHER): Payer: Self-pay | Admitting: Emergency Medicine

## 2022-12-06 DIAGNOSIS — R519 Headache, unspecified: Secondary | ICD-10-CM | POA: Insufficient documentation

## 2022-12-06 MED ORDER — KETOROLAC TROMETHAMINE 30 MG/ML IJ SOLN
15.0000 mg | Freq: Once | INTRAMUSCULAR | Status: AC
Start: 2022-12-06 — End: 2022-12-06
  Administered 2022-12-06: 15 mg via INTRAVENOUS
  Filled 2022-12-06: qty 1

## 2022-12-06 MED ORDER — SODIUM CHLORIDE 0.9 % IV SOLN
12.5000 mg | Freq: Once | INTRAVENOUS | Status: AC
  Administered 2022-12-06: 12.5 mg via INTRAVENOUS
  Filled 2022-12-06: qty 0.5

## 2022-12-06 MED ORDER — SODIUM CHLORIDE 0.9 % IV BOLUS
1000.0000 mL | Freq: Once | INTRAVENOUS | Status: AC
  Administered 2022-12-06: 1000 mL via INTRAVENOUS

## 2022-12-06 MED ORDER — METOCLOPRAMIDE HCL 5 MG/ML IJ SOLN
10.0000 mg | Freq: Once | INTRAMUSCULAR | Status: DC
  Filled 2022-12-06: qty 2

## 2022-12-06 MED ORDER — PROMETHAZINE HCL 25 MG/ML IJ SOLN
INTRAMUSCULAR | Status: AC
  Filled 2022-12-06: qty 1

## 2022-12-06 MED ORDER — DIPHENHYDRAMINE HCL 50 MG/ML IJ SOLN
12.5000 mg | Freq: Once | INTRAMUSCULAR | Status: AC
  Administered 2022-12-06: 12.5 mg via INTRAVENOUS
  Filled 2022-12-06: qty 1

## 2022-12-06 NOTE — ED Triage Notes (Signed)
Patient reports migraine ongoing for 5 days. Endorses light sensitivity and nausea. Hx of migraines. Has tried OTC medication with no relief. Patient states that she normally finds relief with phenergan and Toradol in the hospital with these type of migraines.

## 2022-12-06 NOTE — Discharge Instructions (Signed)
You have been seen today for your complaint of headache. Your discharge medications include Alternate tylenol and ibuprofen for pain. You may alternate these every 4 hours. You may take up to 800 mg of ibuprofen at a time and up to 1000 mg of tylenol. Follow up with: Your primary care provider for reevaluation in 1 week Please seek immediate medical care if you develop any of the following symptoms: Your migraine becomes severe and medicine does not help. You have a fever or stiff neck. You have vision loss. Your muscles feel weak or like you cannot control them. You lose your balance often or have trouble walking. You feel like you may faint or you faint. You start having sudden and unexpected, severe headaches. You have a seizure. At this time there does not appear to be the presence of an emergent medical condition, however there is always the potential for conditions to change. Please read and follow the below instructions.  Do not take your medicine if  develop an itchy rash, swelling in your mouth or lips, or difficulty breathing; call 911 and seek immediate emergency medical attention if this occurs.  You may review your lab tests and imaging results in their entirety on your MyChart account.  Please discuss all results of fully with your primary care provider and other specialist at your follow-up visit.  Note: Portions of this text may have been transcribed using voice recognition software. Every effort was made to ensure accuracy; however, inadvertent computerized transcription errors may still be present.

## 2022-12-06 NOTE — ED Provider Notes (Signed)
[place] [facility] AT [facility] Provider Note   CSN: [id number] Arrival date & time: 12/06/22  2003     History  Chief Complaint  Patient presents with   Migraine    Valerie [name] is a 32 y.o. female.  With history of migraines, anxiety, depression, Bell's palsy, anemia presenting for evaluation of a headache.  She states she has had this headache for the past 5 days.  Headache gradually got worse over the course of the first day and has remained constant since then.  She has tried home medications such as Excedrin and extra caffeine with no improvement in her symptoms.  Headache is localized to the superior portion of her scalp.  Described as a throbbing sensation and a constant dull ache.  She reports scintillating scotomas but denies any blurred vision or vision loss.  She denies any numbness, weakness or tingling.  No fevers or neck stiffness.  She reports some nausea and has had a few episodes of vomiting due to the headache.  She reports photophobia and phonophobia.   Migraine Associated symptoms include headaches.       Home Medications Prior to Admission medications   Medication Sig Start Date End Date Taking? Authorizing Provider  baclofen (LIORESAL) 10 MG tablet Take 1 tablet (10 mg total) by mouth daily as needed for muscle spasms. 06/23/21   Raspet, Noberto Retort, PA-C  buPROPion (WELLBUTRIN) 75 MG tablet Take by mouth.    [provider]  ipratropium (ATROVENT) 0.06 % nasal spray Place into the nose. 01/08/22 01/12/22  [provider]  Menthol, Topical Analgesic, (BIOFREEZE) 4 % GEL Apply 1 application topically 4 (four) times daily as needed (For back pain.).    [provider]  nebivolol (BYSTOLIC) 5 MG tablet Take 0.5 tablets (2.5 mg total) by mouth daily. May increase to one whole tablet (5 mg)  if needed. 04/20/19   Revankar, Aundra Dubin, MD  valACYclovir (VALTREX) 1000 MG tablet TAKE 1 TABLET BY MOUTH EVERY 8 HOURS FOR 10  DAYS 04/24/19   [provider]      Allergies    Augmentin [amoxicillin-pot clavulanate], Ciprofloxacin, Doxycycline, Hydrocodone, Prednisone, and Raspberry    Review of Systems   Review of Systems  Neurological:  Positive for headaches.  All other systems reviewed and are negative.   Physical Exam Updated Vital Signs BP 111/82   Pulse 74   Temp 97.9 F (36.6 C) (Oral)   Resp 16   Ht 5\' 5"  (1.651 m)   Wt 102.1 kg   SpO2 98%   BMI 37.44 kg/m  Physical Exam Vitals and nursing note reviewed.  Constitutional:      General: She is not in acute distress.    Appearance: Normal appearance. She is normal weight. She is not ill-appearing.  HENT:     Head: Normocephalic and atraumatic.  Eyes:     Extraocular Movements: Extraocular movements intact.     Conjunctiva/sclera: Conjunctivae normal.     Pupils: Pupils are equal, round, and reactive to light.  Neck:     Comments: Negative Kernig's and Brudzinski's Pulmonary:     Effort: Pulmonary effort is normal. No respiratory distress.  Abdominal:     General: Abdomen is flat.  Musculoskeletal:        General: Normal range of motion.     Cervical back: Neck supple.  Skin:    General: Skin is warm and dry.  Neurological:     General: No focal deficit  present.     Mental Status: She is alert and oriented to person, place, and time.     Comments:   MENTAL STATUS: AAOx3   LANG/SPEECH: Fluent, intact naming, repetition & comprehension   CRANIAL NERVES:   II: Pupils equal and reactive   III, IV, VI: EOM intact, no gaze preference or deviation, no nystagmus   V: normal sensation of the face   VII: no facial asymmetry   VIII: normal hearing to speech   MOTOR: 5/5 in both upper and lower extremities   SENSORY: Normal to touch in all extremiteis   COORD: Normal finger to nose, heel to shin and shoulder shrug, no tremor, no dysmetria. No pronator drift   Psychiatric:        Mood and Affect: Mood normal.        Behavior:  Behavior normal.     ED Results / Procedures / Treatments   Labs (all labs ordered are listed, but only abnormal results are displayed) Labs Reviewed - No data to display  EKG None  Radiology No results found.  Procedures Procedures    Medications Ordered in ED Medications  promethazine (PHENERGAN) 25 MG/ML injection (  Not Given 12/06/22 2232)  sodium chloride 0.9 % bolus 1,000 mL (1,000 mLs Intravenous New Bag/Given 12/06/22 2217)  ketorolac (TORADOL) 30 MG/ML injection 15 mg (15 mg Intravenous Given 12/06/22 2217)  diphenhydrAMINE (BENADRYL) injection 12.5 mg (12.5 mg Intravenous Given 12/06/22 2218)  promethazine (PHENERGAN) 12.5 mg in sodium chloride 0.9 % 50 mL IVPB (0 mg Intravenous Stopped 12/06/22 2243)    ED Course/ Medical Decision Making/ A&P                                 Medical Decision Making Risk Prescription drug management.   This patient presents to the ED for concern of headache, this involves an extensive number of treatment options, and is a complaint that carries with it a high risk of complications and morbidity.  Emergent considerations for headache include subarachnoid hemorrhage, meningitis, temporal arteritis, glaucoma, cerebral ischemia, carotid/vertebral dissection, intracranial tumor, Venous sinus thrombosis, carbon monoxide poisoning, acute or chronic subdural hemorrhage.  Other considerations include: Migraine, Cluster headache, Hypertension, Caffeine, alcohol, or drug withdrawal, Pseudotumor cerebri, Arteriovenous malformation, Head injury, Neurocysticercosis, Post-lumbar puncture, Preeclampsia, Tension headache, Sinusitis, Cervical arthritis, Refractive error causing strain, Dental abscess, Otitis media, Temporomandibular joint syndrome, Depression, Somatoform disorder (eg, somatization) Trigeminal neuralgia, Glossopharyngeal neuralgia.   My initial workup includes symptom control  Additional history obtained from: Nursing notes from this  visit.  Afebrile, hemodynamically stable.  32 year old female presenting for evaluation of headache.  She states her headache feels exactly the same as her previous migraines.  She used to suffer with frequent migraines and was on Imitrex for this, however she does not suffer from migraines as frequently anymore and is no longer prescribed Imitrex.  Headache was not sudden in onset or worst at onset and began 5 days ago making suspicion for [facility] less likely.  No vision loss or other neurologic symptoms making dural venous thrombosis less likely.  No fevers or neck stiffness to suggest meningitis.  Overall suspect this is likely her typical migraine.  No focal neurologic deficits on exam.  She reported significant improvement in her symptoms after treatment in the emergency department.  She was encouraged to follow-up with her PCP for reevaluation of her symptoms.  She was encouraged to drink plenty of  water and to avoid caffeine.  She was given return precautions.  Stable at discharge.  At this time there does not appear to be any evidence of an acute emergency medical condition and the patient appears stable for discharge with appropriate outpatient follow up. Diagnosis was discussed with patient who verbalizes understanding of care plan and is agreeable to discharge. I have discussed return precautions with patient who verbalizes understanding. Patient encouraged to follow-up with their PCP within 1 week. All questions answered.  Note: Portions of this report may have been transcribed using voice recognition software. Every effort was made to ensure accuracy; however, inadvertent computerized transcription errors may still be present.        Final Clinical Impression(s) / ED Diagnoses Final diagnoses:  Bad headache    Rx / DC Orders ED Discharge Orders     None         Mora Bellman 12/06/22 2357    Benjiman Core, MD 12/09/22 [phone]

## 2023-03-19 NOTE — L&D Delivery Note (Signed)
 Delivery Note    Patient Name: Valerie [name] DOB: 04/15/1990 MRN: [id number]  Date of admission: 12/13/2023 Delivering MD: Ziomara Birenbaum , Verdie Wilms Date of delivery: 12/14/2023 Type of delivery: SVD  Newborn Data: Live born female  Birth Weight:   APGAR: 8, 9  Newborn Delivery   Birth date/time: 12/14/2023 06:30:00 Delivery type: Vaginal, Spontaneous     [name], 33 y.o., @ [redacted]w[redacted]d,  [id number], who was admitted for latent labor. Pt progressed and consented to AROM when complete, mec noted, moderate, gbs-.. I was called to the room when she progressed 3+ station in the second stage of labor.  She pushed for 1/min.  She delivered a viable infant, cephalic and restituted to the LOA position over an intact perineum.  A nuchal cord   was not identified. The baby was placed on maternal abdomen while initial step of NRP were perfmored (Dry, Stimulated, and warmed). Hat placed on baby for thermoregulation. Delayed cord clamping was performed for 2 minutes.  Cord double clamped and cut.  Cord cut by FOB. Apgar scores were 8 and 9. Prophylactic Pitocin  was started in the third stage of labor for active management. The placenta delivered spontaneously, shultz, with a 3 vessel cord and was sent to path r/t feeling very warm and discoloration, mec stained.  Inspection revealed none. An examination of the vaginal vault and cervix was free from lacerations. The uterus was firm, bleeding stable.   Umbilical artery blood gas were not sent.  There were no complications during the procedure.  Mom and baby skin to skin following delivery. Left in stable condition.  Maternal Info: Anesthesia: Epidural Episiotomy: no Lacerations:  no Suture Repair: no Est. Blood Loss (mL):   Newborn Info:  Baby Sex: female Circumcision: in pt   APGAR (1 MIN): 8  APGAR (5 MINS): 9  APGAR (10 MINS):     Mom to postpartum.  Baby to Couplet care / Skin to Skin.  Delivery Report:    Review the Delivery Report for  details.   Icker Swigert  CNM, FNP-C, PMHNP-BC  [id number]  [place], [place] [address]  Cell: [phone]  Office Phone: [phone] Fax: [phone] 12/14/2023  7:15 AM

## 2023-05-06 ENCOUNTER — Encounter (HOSPITAL_COMMUNITY): Payer: Self-pay | Admitting: *Deleted

## 2023-05-06 ENCOUNTER — Inpatient Hospital Stay (HOSPITAL_COMMUNITY)
Admission: AD | Admit: 2023-05-06 | Discharge: 2023-05-06 | Disposition: A | Discharge status: Home / Self Care | Payer: PRIVATE HEALTH INSURANCE | Attending: Obstetrics and Gynecology | Admitting: Obstetrics and Gynecology

## 2023-05-06 ENCOUNTER — Other Ambulatory Visit: Payer: Self-pay

## 2023-05-06 ENCOUNTER — Inpatient Hospital Stay (HOSPITAL_COMMUNITY): Payer: PRIVATE HEALTH INSURANCE

## 2023-05-06 DIAGNOSIS — Z3201 Encounter for pregnancy test, result positive: Secondary | ICD-10-CM | POA: Diagnosis present

## 2023-05-06 DIAGNOSIS — Z3A01 Less than 8 weeks gestation of pregnancy: Secondary | ICD-10-CM

## 2023-05-06 DIAGNOSIS — Z9851 Tubal ligation status: Secondary | ICD-10-CM

## 2023-05-06 DIAGNOSIS — O26891 Other specified pregnancy related conditions, first trimester: Secondary | ICD-10-CM | POA: Insufficient documentation

## 2023-05-06 DIAGNOSIS — Z3493 Encounter for supervision of normal pregnancy, unspecified, third trimester: Secondary | ICD-10-CM | POA: Diagnosis not present

## 2023-05-06 LAB — WET PREP, GENITAL
Clue Cells Wet Prep HPF POC: NONE SEEN
Sperm: NONE SEEN
Trich, Wet Prep: NONE SEEN
WBC, Wet Prep HPF POC: >=10 — AB (ref <10)
Yeast Wet Prep HPF POC: NONE SEEN

## 2023-05-06 LAB — CBC
HCT: 37.1 % (ref 36.0–46.0)
Hemoglobin: 12 g/dL (ref 12.0–15.0)
MCH: 30.6 pg (ref 26.0–34.0)
MCHC: 32.3 g/dL (ref 30.0–36.0)
MCV: 94.6 fL (ref 80.0–100.0)
Platelets: 230 10*3/uL (ref 150–400)
RBC: 3.92 MIL/uL (ref 3.87–5.11)
RDW: 14.7 % (ref 11.5–15.5)
WBC: 6.8 10*3/uL (ref 4.0–10.5)
nRBC: 0 % (ref 0.0–0.2)

## 2023-05-06 LAB — GC/CHLAMYDIA PROBE AMP (~~LOC~~) NOT AT ARMC
Chlamydia: NEGATIVE
Comment: NEGATIVE
Comment: NORMAL
Neisseria Gonorrhea: NEGATIVE

## 2023-05-06 LAB — URINALYSIS, ROUTINE W REFLEX MICROSCOPIC
Bilirubin Urine: NEGATIVE
Glucose, UA: NEGATIVE mg/dL
Hgb urine dipstick: NEGATIVE
Ketones, ur: NEGATIVE mg/dL
Leukocytes,Ua: NEGATIVE
Nitrite: NEGATIVE
Protein, ur: NEGATIVE mg/dL
Specific Gravity, Urine: 1.017 (ref 1.005–1.030)
pH: 5 (ref 5.0–8.0)

## 2023-05-06 LAB — ABO/RH: ABO/RH(D): O POS

## 2023-05-06 LAB — HCG, QUANTITATIVE, PREGNANCY: hCG, Beta Chain, Quant, S: 16786 m[IU]/mL — ABNORMAL HIGH (ref <5)

## 2023-05-06 LAB — POCT PREGNANCY, URINE: Preg Test, Ur: POSITIVE — AB

## 2023-05-06 MED ORDER — PREPLUS 27-1 MG PO TABS: 1.0000 | ORAL_TABLET | Freq: Every day | ORAL | 13 refills | Status: AC

## 2023-05-06 NOTE — Discharge Instructions (Signed)

## 2023-05-06 NOTE — MAU Provider Note (Addendum)
History     CSN: [id number]  Arrival date and time: 05/06/23 1137   Event Date/Time   First Provider Initiated Contact with Patient 05/06/23 1317      Chief Complaint  Patient presents with   Abdominal Pain   HPI Patient is 33 y.o. [id number] [redacted]w[redacted]d here with complaints of positive pregnancy test after BTL in 2020 with Filshie clips. Patient's last menstrual period was 03/04/2023.  Denies abdominal pain. Home UPT was positive.  denies LOF, VB, contractions, vaginal discharge.   OB History     Gravida  5   Para  2   Term  2   Preterm      AB  2   Living  2      SAB  2   IAB      Ectopic      Multiple  0   Live Births  2           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   BV (bacterial vaginosis)    Depression    Eczema    First degree heart block    Gastric ulcer    2000   Heart murmur    Infection    UTI, chronic sinus inf   Migraine    Mitral valve regurgitation    Ovarian cyst    Tricuspid valve regurgitation    UTI (urinary tract infection)     Past Surgical History:  Procedure Laterality Date   ADENOIDECTOMY     COLPOSCOPY     CRYOTHERAPY     LEEP     MYRINGOTOMY WITH TUBE PLACEMENT     TUBAL LIGATION N/A 04/01/2018   Procedure: POST PARTUM TUBAL LIGATION;  Surgeon: Kathrynn Running, MD;  Location: [facility];  Service: Gynecology;  Laterality: N/A;    Family History  Problem Relation Age of Onset   Hypertension Mother    Diabetes Father    Hypertension Father    Cancer Paternal Aunt        female   Diabetes Paternal Uncle    Diabetes Paternal Grandmother    Hypertension Paternal Grandmother    Diabetes Paternal Grandfather    Hypertension Paternal Grandfather     Social History   Tobacco Use   Smoking status: Every Day    Current packs/day: 0.00    Types: Cigarettes    Last attempt to quit: 08/05/2017     Years since quitting: 5.7   Smokeless tobacco: Never  Vaping Use   Vaping status: Former  Substance Use Topics   Alcohol use: Yes    Comment: rarely   Drug use: No    Allergies:  Allergies  Allergen Reactions   Augmentin [Amoxicillin-Pot Clavulanate] Diarrhea and Nausea And Vomiting        Ciprofloxacin Hives   Doxycycline Hives   Hydrocodone Hives   Prednisone     Had a reaction to high doses and was told not to take again   Raspberry Swelling    Throat swelling    No medications prior to admission.    Review of Systems  Constitutional:  Negative for chills and fever.  HENT:  Negative for congestion and sore throat.   Eyes:  Negative for pain and visual disturbance.  Respiratory:  Negative for cough, chest tightness and shortness of  breath.   Cardiovascular:  Negative for chest pain.  Gastrointestinal:  Negative for abdominal pain, diarrhea, nausea and vomiting.  Endocrine: Negative for cold intolerance and heat intolerance.  Genitourinary:  Negative for dysuria and flank pain.  Musculoskeletal:  Negative for back pain.  Skin:  Negative for rash.  Allergic/Immunologic: Negative for food allergies.  Neurological:  Negative for dizziness and light-headedness.  Psychiatric/Behavioral:  Negative for agitation.    Physical Exam   Blood pressure 121/82, pulse 86, temperature 98.1 F (36.7 C), temperature source Oral, height 5\' 5"  (1.651 m), weight 109.5 kg, last menstrual period 03/04/2023, SpO2 100%, currently breastfeeding.  Physical Exam Vitals and nursing note reviewed.  Constitutional:      General: She is not in acute distress.    Appearance: She is well-developed.  HENT:     Head: Normocephalic and atraumatic.  Eyes:     General: No scleral icterus.    Conjunctiva/sclera: Conjunctivae normal.  Cardiovascular:     Rate and Rhythm: Normal rate.  Pulmonary:     Effort: Pulmonary effort is normal.  Chest:     Chest wall: No tenderness.  Abdominal:      Palpations: Abdomen is soft.     Tenderness: There is no abdominal tenderness. There is no guarding or rebound.  Genitourinary:    Vagina: Normal.  Musculoskeletal:        General: Normal range of motion.     Cervical back: Normal range of motion and neck supple.  Skin:    General: Skin is warm and dry.     Findings: No rash.  Neurological:     Mental Status: She is alert and oriented to person, place, and time.     MAU Course  Procedures  MDM- high  Concern for ectopic high given prior BTL. Ddx includes ectopic or normally progressing IUP  Labs drawn Results for orders placed or performed during the hospital encounter of 05/06/23 (from the past 24 hours)  Pregnancy, urine POC     Status: Abnormal   Collection Time: 05/06/23 11:52 AM  Result Value Ref Range   Preg Test, Ur POSITIVE (A) NEGATIVE  Urinalysis, Routine w reflex microscopic -Urine, Clean Catch     Status: None   Collection Time: 05/06/23 11:56 AM  Result Value Ref Range   Color, Urine YELLOW YELLOW   APPearance CLEAR CLEAR   Specific Gravity, Urine 1.017 1.005 - 1.030   pH 5.0 5.0 - 8.0   Glucose, UA NEGATIVE NEGATIVE mg/dL   Hgb urine dipstick NEGATIVE NEGATIVE   Bilirubin Urine NEGATIVE NEGATIVE   Ketones, ur NEGATIVE NEGATIVE mg/dL   Protein, ur NEGATIVE NEGATIVE mg/dL   Nitrite NEGATIVE NEGATIVE   Leukocytes,Ua NEGATIVE NEGATIVE  ABO/Rh     Status: None   Collection Time: 05/06/23 12:03 PM  Result Value Ref Range   ABO/RH(D) O POS    No rh immune globuloin      NOT A RH IMMUNE GLOBULIN CANDIDATE, PT RH POSITIVE Performed at [facility], [address] [place], [place] [address]   CBC     Status: None   Collection Time: 05/06/23 12:06 PM  Result Value Ref Range   WBC 6.8 4.0 - 10.5 K/uL   RBC 3.92 3.87 - 5.11 MIL/uL   Hemoglobin 12.0 12.0 - 15.0 g/dL   HCT 44.0 34.7 - 42.5 %   MCV 94.6 80.0 - 100.0 fL   MCH 30.6 26.0 - 34.0 pg   MCHC 32.3 30.0 - 36.0  g/dL   RDW 96.0 45.4 - 09.8 %    Platelets 230 150 - 400 K/uL   nRBC 0.0 0.0 - 0.2 %  hCG, quantitative, pregnancy     Status: Abnormal   Collection Time: 05/06/23 12:06 PM  Result Value Ref Range   hCG, Beta Chain, Quant, S 16,786 (H) <5 mIU/mL  Wet prep, genital     Status: Abnormal   Collection Time: 05/06/23 12:22 PM   Specimen: Vaginal  Result Value Ref Range   Yeast Wet Prep HPF POC NONE SEEN NONE SEEN   Trich, Wet Prep NONE SEEN NONE SEEN   Clue Cells Wet Prep HPF POC NONE SEEN NONE SEEN   WBC, Wet Prep HPF POC >=10 (A) <10   Sperm NONE SEEN     Imaging showed IUP with FHR. I personally reviewed the images and updated the patient's EDD based on CRL  1:52 PM reviewed results with aptient and informed about failed tubal sterilization  Assessment and Plan   1. Viable pregnancy in third trimester   2. S/P tubal ligation   3. [redacted] weeks gestation of pregnancy    - Establish care  - Rx for prenatal vitamins sent - Patient given list of prenatal care providers   Federico Flake 05/06/2023, 1:52 PM

## 2023-05-06 NOTE — MAU Note (Signed)
Valerie [name] is a 33 y.o. at Unknown here in MAU reporting: she had a +HPT and is status post BTL in January 2020.  States is having intermittent lower abdominal cramping, denies VB.  LMP: 03/04/2023 Onset of complaint: Friday Pain score: 0 Vitals:   05/06/23 1217  BP: 121/82  Pulse: 86  Temp: 98.1 F (36.7 C)  SpO2: 100%     FHT: NA  Lab orders placed from triage: UPT

## 2023-05-18 ENCOUNTER — Inpatient Hospital Stay (HOSPITAL_COMMUNITY)
Admission: AD | Admit: 2023-05-18 | Discharge: 2023-05-19 | Disposition: A | Discharge status: Home / Self Care | Payer: PRIVATE HEALTH INSURANCE | Attending: Obstetrics & Gynecology | Admitting: Obstetrics & Gynecology

## 2023-05-18 ENCOUNTER — Encounter (HOSPITAL_COMMUNITY): Payer: Self-pay | Admitting: Obstetrics and Gynecology

## 2023-05-18 DIAGNOSIS — R112 Nausea with vomiting, unspecified: Secondary | ICD-10-CM

## 2023-05-18 DIAGNOSIS — R197 Diarrhea, unspecified: Secondary | ICD-10-CM

## 2023-05-18 DIAGNOSIS — Z3A08 8 weeks gestation of pregnancy: Secondary | ICD-10-CM | POA: Diagnosis not present

## 2023-05-18 DIAGNOSIS — O219 Vomiting of pregnancy, unspecified: Secondary | ICD-10-CM | POA: Insufficient documentation

## 2023-05-18 LAB — RESPIRATORY PANEL BY PCR

## 2023-05-18 LAB — CBC WITH DIFFERENTIAL/PLATELET
Abs Immature Granulocytes: 0.04 10*3/uL (ref 0.00–0.07)
Basophils Absolute: 0 10*3/uL (ref 0.0–0.1)
Basophils Relative: 1 %
Eosinophils Absolute: 0.1 10*3/uL (ref 0.0–0.5)
Eosinophils Relative: 1 %
HCT: 35.9 % — ABNORMAL LOW (ref 36.0–46.0)
Hemoglobin: 11.6 g/dL — ABNORMAL LOW (ref 12.0–15.0)
Immature Granulocytes: 1 %
Lymphocytes Relative: 29 %
Lymphs Abs: 2.5 10*3/uL (ref 0.7–4.0)
MCH: 30.7 pg (ref 26.0–34.0)
MCHC: 32.3 g/dL (ref 30.0–36.0)
MCV: 95 fL (ref 80.0–100.0)
Monocytes Absolute: 0.5 10*3/uL (ref 0.1–1.0)
Monocytes Relative: 6 %
Neutro Abs: 5.5 10*3/uL (ref 1.7–7.7)
Neutrophils Relative %: 62 %
Platelets: 227 10*3/uL (ref 150–400)
RBC: 3.78 MIL/uL — ABNORMAL LOW (ref 3.87–5.11)
RDW: 14.2 % (ref 11.5–15.5)
WBC: 8.6 10*3/uL (ref 4.0–10.5)
nRBC: 0 % (ref 0.0–0.2)

## 2023-05-18 LAB — COMPREHENSIVE METABOLIC PANEL
ALT: 16 U/L (ref 0–44)
AST: 14 U/L — ABNORMAL LOW (ref 15–41)
Albumin: 3.3 g/dL — ABNORMAL LOW (ref 3.5–5.0)
Alkaline Phosphatase: 65 U/L (ref 38–126)
Anion gap: 8 (ref 5–15)
BUN: 6 mg/dL (ref 6–20)
CO2: 23 mmol/L (ref 22–32)
Calcium: 9.2 mg/dL (ref 8.9–10.3)
Chloride: 106 mmol/L (ref 98–111)
Creatinine, Ser: 0.69 mg/dL (ref 0.44–1.00)
GFR, Estimated: >60 mL/min (ref >60)
Glucose, Bld: 101 mg/dL — ABNORMAL HIGH (ref 70–99)
Potassium: 4 mmol/L (ref 3.5–5.1)
Sodium: 137 mmol/L (ref 135–145)
Total Bilirubin: 0.4 mg/dL (ref 0.0–1.2)
Total Protein: 6.6 g/dL (ref 6.5–8.1)

## 2023-05-18 LAB — URINALYSIS, ROUTINE W REFLEX MICROSCOPIC
Bilirubin Urine: NEGATIVE
Glucose, UA: NEGATIVE mg/dL
Hgb urine dipstick: NEGATIVE
Ketones, ur: 5 mg/dL — AB
Nitrite: NEGATIVE
Protein, ur: NEGATIVE mg/dL
Specific Gravity, Urine: 1.021 (ref 1.005–1.030)
pH: 5 (ref 5.0–8.0)

## 2023-05-18 MED ORDER — SCOPOLAMINE 1 MG/3DAYS TD PT72SCOPOLAMINE 1 MG/3DAYS
1.0000 | MEDICATED_PATCH | Freq: Once | TRANSDERMAL | Status: DC
Start: 2023-05-18 — End: 2023-05-19
  Administered 2023-05-18: 1.5 mg via TRANSDERMAL
  Filled 2023-05-18: qty 1

## 2023-05-18 MED ORDER — PROMETHAZINE HCL 25 MG PO TABS
25.0000 mg | ORAL_TABLET | Freq: Once | ORAL | Status: AC
  Administered 2023-05-18: 25 mg via ORAL
  Filled 2023-05-18: qty 1

## 2023-05-18 MED ORDER — ONDANSETRON 4 MG PO TBDP
8.0000 mg | ORAL_TABLET | Freq: Once | ORAL | Status: DC
  Administered 2023-05-18: 8 mg via ORAL
  Filled 2023-05-18: qty 2

## 2023-05-18 NOTE — MAU Provider Note (Signed)
 N/V    S Valerie [name] is a 33 y.o. [phone] pregnant female at [redacted]w[redacted]d who presents to MAU today with complaint of N/V and diarrhea. She's also had some lower abdominal cramping, mostly suprapubic and in s/o polyuria. On 2/18 had SIUP on [place]. VSS, afebrile, SORA. Sick contacts denied but patient states this is way different than her usual nausea and very intermittent vomiting (baseline once every couple of days) that she's experienced thus far this pregnancy. She states it started about 2 hrs s/p eating at [employer].  Pertinent items noted in HPI and remainder of comprehensive ROS otherwise negative.   O BP 119/83 (BP Location: Right Arm)   Pulse 68   Temp 98.1 F (36.7 C) (Oral)   Resp 15   LMP 03/04/2023   SpO2 99%  Physical Exam Vitals and nursing note reviewed.  Constitutional:      General: She is not in acute distress.    Appearance: She is well-developed. She is obese. She is ill-appearing.  HENT:     Head: Normocephalic and atraumatic.     Mouth/Throat:     Mouth: Mucous membranes are moist.     Pharynx: Oropharynx is clear.  Eyes:     Extraocular Movements: Extraocular movements intact.  Cardiovascular:     Rate and Rhythm: Normal rate.  Pulmonary:     Effort: Pulmonary effort is normal. No respiratory distress.  Abdominal:     General: Abdomen is flat. There is no distension.     Tenderness: There is abdominal tenderness in the suprapubic area.  Skin:    General: Skin is warm and dry.  Neurological:     Mental Status: She is alert and oriented to person, place, and time.     Motor: No weakness.  Psychiatric:        Mood and Affect: Mood normal.        Behavior: Behavior normal.      MDM: MAU Course:  UA moderate LE, few bacteria, added OB culture  CBC no leukocytosis, Hgb 11.6, Plts 227 CMP reassuring Hep Panel negative   RVP not drawn prior to d/c   N/V improved s/p phenergan and scop patch.  Pt able to tolerate PO and requesting D/C.   Sending with new px for patches.  Stable for d/c.Pt told could be norovirus given diarrhea and to practice good hand hygiene.    AP #[redacted] weeks gestation #Nausea vomiting - sent with scop patch and phenergan rx   Discharge from MAU in stable condition with strict / usual precautions Follow up at desired OBGYN as scheduled for ongoing prenatal care  Allergies as of 05/19/2023       Reactions   Augmentin [amoxicillin-pot Clavulanate] Diarrhea, Nausea And Vomiting       Ciprofloxacin Hives   Doxycycline Hives   Hydrocodone Hives   Prednisone    Had a reaction to high doses and was told not to take again   Raspberry Swelling   Throat swelling        Medication List     TAKE these medications    Biofreeze 4 % Gel Generic drug: Menthol (Topical Analgesic) Apply 1 application topically 4 (four) times daily as needed (For back pain.).   PrePLUS 27-1 MG Tabs Take 1 tablet by mouth daily.   promethazine 12.5 MG tablet Commonly known as: PHENERGAN Take 1 tablet (12.5 mg total) by mouth every 6 (six) hours as needed for nausea or vomiting.   scopolamine 1  MG/3DAYS Commonly known as: TRANSDERM-SCOP Place 1 patch (1.5 mg total) onto the skin every 3 (three) days.        Hessie Dibble, MD 05/19/2023 1:21 AM

## 2023-05-18 NOTE — MAU Note (Signed)
.  Valerie [name] is a 33 y.o. at [redacted]w[redacted]d here in MAU reporting: around 10 episodes of emesis today, 4 episodes of diarrhea, and some lower abd cramping  Pain score: 4 Vitals:   05/18/23 1910  BP: 119/83  Pulse: 68  Resp: 15  Temp: 98.1 F (36.7 C)  SpO2: 99%      Lab orders placed from triage: ua

## 2023-05-19 DIAGNOSIS — O219 Vomiting of pregnancy, unspecified: Secondary | ICD-10-CM | POA: Diagnosis not present

## 2023-05-19 LAB — HEPATITIS PANEL, ACUTE: Hepatitis B Surface Ag: NONREACTIVE

## 2023-05-19 MED ORDER — SCOPOLAMINE 1 MG/3DAYS TD PT72: 1.0000 | MEDICATED_PATCH | TRANSDERMAL | 1 refills | Status: DC

## 2023-05-19 MED ORDER — PROMETHAZINE HCL 12.5 MG PO TABS: 12.5000 mg | ORAL_TABLET | Freq: Four times a day (QID) | ORAL | 0 refills | Status: DC | PRN

## 2023-05-20 LAB — CULTURE, OB URINE

## 2023-05-21 ENCOUNTER — Inpatient Hospital Stay (HOSPITAL_COMMUNITY)
Admission: AD | Admit: 2023-05-21 | Discharge: 2023-05-22 | Disposition: A | Discharge status: Home / Self Care | Payer: PRIVATE HEALTH INSURANCE | Attending: Obstetrics and Gynecology | Admitting: Obstetrics and Gynecology

## 2023-05-21 ENCOUNTER — Inpatient Hospital Stay (HOSPITAL_COMMUNITY): Payer: PRIVATE HEALTH INSURANCE

## 2023-05-21 DIAGNOSIS — R531 Weakness: Secondary | ICD-10-CM | POA: Insufficient documentation

## 2023-05-21 DIAGNOSIS — H532 Diplopia: Secondary | ICD-10-CM | POA: Diagnosis not present

## 2023-05-21 DIAGNOSIS — I6523 Occlusion and stenosis of bilateral carotid arteries: Secondary | ICD-10-CM | POA: Insufficient documentation

## 2023-05-21 DIAGNOSIS — F17210 Nicotine dependence, cigarettes, uncomplicated: Secondary | ICD-10-CM | POA: Insufficient documentation

## 2023-05-21 DIAGNOSIS — O99331 Smoking (tobacco) complicating pregnancy, first trimester: Secondary | ICD-10-CM | POA: Diagnosis not present

## 2023-05-21 DIAGNOSIS — Z3A08 8 weeks gestation of pregnancy: Secondary | ICD-10-CM | POA: Insufficient documentation

## 2023-05-21 DIAGNOSIS — I081 Rheumatic disorders of both mitral and tricuspid valves: Secondary | ICD-10-CM | POA: Insufficient documentation

## 2023-05-21 DIAGNOSIS — O99411 Diseases of the circulatory system complicating pregnancy, first trimester: Secondary | ICD-10-CM | POA: Diagnosis not present

## 2023-05-21 DIAGNOSIS — R112 Nausea with vomiting, unspecified: Secondary | ICD-10-CM | POA: Diagnosis not present

## 2023-05-21 DIAGNOSIS — O219 Vomiting of pregnancy, unspecified: Secondary | ICD-10-CM | POA: Diagnosis not present

## 2023-05-21 DIAGNOSIS — R202 Paresthesia of skin: Secondary | ICD-10-CM | POA: Diagnosis not present

## 2023-05-21 DIAGNOSIS — R42 Dizziness and giddiness: Secondary | ICD-10-CM | POA: Insufficient documentation

## 2023-05-21 DIAGNOSIS — R200 Anesthesia of skin: Secondary | ICD-10-CM | POA: Diagnosis not present

## 2023-05-21 DIAGNOSIS — O26891 Other specified pregnancy related conditions, first trimester: Secondary | ICD-10-CM | POA: Diagnosis not present

## 2023-05-21 LAB — CBC WITH DIFFERENTIAL/PLATELET
Abs Immature Granulocytes: 0.01 10*3/uL (ref 0.00–0.07)
Basophils Absolute: 0 10*3/uL (ref 0.0–0.1)
Basophils Relative: 1 %
Eosinophils Absolute: 0 10*3/uL (ref 0.0–0.5)
Eosinophils Relative: 1 %
HCT: 37.3 % (ref 36.0–46.0)
Hemoglobin: 12.1 g/dL (ref 12.0–15.0)
Immature Granulocytes: 0 %
Lymphocytes Relative: 24 %
Lymphs Abs: 1.6 10*3/uL (ref 0.7–4.0)
MCH: 30.9 pg (ref 26.0–34.0)
MCHC: 32.4 g/dL (ref 30.0–36.0)
MCV: 95.2 fL (ref 80.0–100.0)
Monocytes Absolute: 0.4 10*3/uL (ref 0.1–1.0)
Monocytes Relative: 6 %
Neutro Abs: 4.8 10*3/uL (ref 1.7–7.7)
Neutrophils Relative %: 68 %
Platelets: 246 10*3/uL (ref 150–400)
RBC: 3.92 MIL/uL (ref 3.87–5.11)
RDW: 14.3 % (ref 11.5–15.5)
WBC: 6.9 10*3/uL (ref 4.0–10.5)
nRBC: 0 % (ref 0.0–0.2)

## 2023-05-21 LAB — COMPREHENSIVE METABOLIC PANEL
ALT: 19 U/L (ref 0–44)
AST: 20 U/L (ref 15–41)
Albumin: 3.5 g/dL (ref 3.5–5.0)
Alkaline Phosphatase: 63 U/L (ref 38–126)
Anion gap: 8 (ref 5–15)
BUN: 7 mg/dL (ref 6–20)
CO2: 24 mmol/L (ref 22–32)
Calcium: 9.2 mg/dL (ref 8.9–10.3)
Chloride: 105 mmol/L (ref 98–111)
Creatinine, Ser: 0.69 mg/dL (ref 0.44–1.00)
GFR, Estimated: >60 mL/min (ref >60)
Glucose, Bld: 90 mg/dL (ref 70–99)
Potassium: 4 mmol/L (ref 3.5–5.1)
Sodium: 137 mmol/L (ref 135–145)
Total Bilirubin: 0.6 mg/dL (ref 0.0–1.2)
Total Protein: 6.7 g/dL (ref 6.5–8.1)

## 2023-05-21 LAB — URINALYSIS, MICROSCOPIC (REFLEX): RBC / HPF: NONE SEEN RBC/hpf (ref 0–5)

## 2023-05-21 LAB — URINALYSIS, ROUTINE W REFLEX MICROSCOPIC
Glucose, UA: NEGATIVE mg/dL
Hgb urine dipstick: NEGATIVE
Ketones, ur: NEGATIVE mg/dL
Leukocytes,Ua: NEGATIVE
Nitrite: NEGATIVE
Protein, ur: NEGATIVE mg/dL
Specific Gravity, Urine: >1.03 — ABNORMAL HIGH (ref 1.005–1.030)
pH: 5.5 (ref 5.0–8.0)

## 2023-05-21 LAB — GLUCOSE, CAPILLARY: Glucose-Capillary: 91 mg/dL (ref 70–99)

## 2023-05-21 MED ORDER — LACTATED RINGERS IV BOLUS
1000.0000 mL | Freq: Once | INTRAVENOUS | Status: AC
  Administered 2023-05-21: 1000 mL via INTRAVENOUS

## 2023-05-21 MED ORDER — METOCLOPRAMIDE HCL 5 MG/ML IJ SOLN
10.0000 mg | Freq: Once | INTRAMUSCULAR | Status: AC
  Administered 2023-05-21: 10 mg via INTRAVENOUS
  Filled 2023-05-21: qty 2

## 2023-05-21 MED ORDER — MAGNESIUM SULFATE 2 GM/50ML IV SOLN
2.0000 g | Freq: Once | INTRAVENOUS | Status: DC
  Filled 2023-05-21: qty 50

## 2023-05-21 MED ORDER — ACETAMINOPHEN 325 MG PO TABS
650.0000 mg | ORAL_TABLET | Freq: Once | ORAL | Status: AC
  Administered 2023-05-22: 650 mg via ORAL
  Filled 2023-05-21: qty 2

## 2023-05-21 MED ORDER — ONDANSETRON 4 MG PO TBDP
4.0000 mg | ORAL_TABLET | Freq: Once | ORAL | Status: AC
  Administered 2023-05-22: 4 mg via ORAL
  Filled 2023-05-21: qty 1

## 2023-05-21 MED ORDER — ONDANSETRON HCL 4 MG/2ML IJ SOLN
4.0000 mg | Freq: Once | INTRAMUSCULAR | Status: AC
  Administered 2023-05-21: 4 mg via INTRAVENOUS
  Filled 2023-05-21: qty 2

## 2023-05-21 MED ORDER — MAGNESIUM SULFATE 2 GM/50ML IV SOLN
2.0000 g | Freq: Once | INTRAVENOUS | Status: AC
  Administered 2023-05-21: 2 g via INTRAVENOUS
  Filled 2023-05-21: qty 50

## 2023-05-21 NOTE — MAU Provider Note (Cosign Needed)
 History     CSN: [id number]  Arrival date and time: 05/21/23 4098   Event Date/Time   First Provider Initiated Contact with Patient 05/21/23 1000      Chief Complaint  Patient presents with   Dizziness   Tingling    Valerie [name] is a 33 y.o. [id number] at [redacted]w[redacted]d who receives care at [facility].  She presents today for dizziness, right side tingling, movement of stationary objects, and black floaters. She reports some "furry mouth with metallic taste."  She also reports left side occipital HA that was present while en route, but none currently. She reports she has been using phenergan and scopolamine patch for nausea.  She reports last dose of phenergan last night and currently has scopolamine patch in place. Patient reports concern for stroke.  No vaginal bleeding or discharge. No abdominal pain.   OB History     Gravida  5   Para  2   Term  2   Preterm      AB  2   Living  2      SAB  2   IAB      Ectopic      Multiple  0   Live Births  2           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   BV (bacterial vaginosis)    Depression    Eczema    First degree heart block    Gastric ulcer    2000   Heart murmur    Infection    UTI, chronic sinus inf   Migraine    Mitral valve regurgitation    Ovarian cyst    Tricuspid valve regurgitation    UTI (urinary tract infection)     Past Surgical History:  Procedure Laterality Date   ADENOIDECTOMY     COLPOSCOPY     CRYOTHERAPY     LEEP     MYRINGOTOMY WITH TUBE PLACEMENT     TUBAL LIGATION N/A 04/01/2018   Procedure: POST PARTUM TUBAL LIGATION;  Surgeon: Kathrynn Running, MD;  Location: [facility];  Service: Gynecology;  Laterality: N/A;    Family History  Problem Relation Age of Onset   Hypertension Mother    Diabetes Father    Hypertension Father    Cancer Paternal Aunt        female    Diabetes Paternal Uncle    Diabetes Paternal Grandmother    Hypertension Paternal Grandmother    Diabetes Paternal Grandfather    Hypertension Paternal Grandfather     Social History   Tobacco Use   Smoking status: Every Day    Current packs/day: 0.00    Types: Cigarettes    Last attempt to quit: 08/05/2017    Years since quitting: 5.7   Smokeless tobacco: Never  Vaping Use   Vaping status: Former  Substance Use Topics   Alcohol use: Yes    Comment: rarely   Drug use: No    Allergies:  Allergies  Allergen Reactions   Augmentin [Amoxicillin-Pot Clavulanate] Diarrhea and Nausea And Vomiting        Ciprofloxacin Hives   Doxycycline Hives   Hydrocodone Hives   Prednisone     Had a reaction to high doses and was told not to take again  Raspberry Swelling    Throat swelling    Medications Prior to Admission  Medication Sig Dispense Refill Last Dose/Taking   Menthol, Topical Analgesic, (BIOFREEZE) 4 % GEL Apply 1 application topically 4 (four) times daily as needed (For back pain.).      Prenatal Vit-Fe Fumarate-FA (PREPLUS) 27-1 MG TABS Take 1 tablet by mouth daily. 30 tablet 13    promethazine (PHENERGAN) 12.5 MG tablet Take 1 tablet (12.5 mg total) by mouth every 6 (six) hours as needed for nausea or vomiting. 30 tablet 0    scopolamine (TRANSDERM-SCOP) 1 MG/3DAYS Place 1 patch (1.5 mg total) onto the skin every 3 (three) days. 10 patch 1     Review of Systems  Constitutional:  Negative for chills and fever.  Eyes:  Positive for visual disturbance.  Respiratory:  Negative for chest tightness and shortness of breath.   Gastrointestinal:  Positive for nausea and vomiting. Negative for constipation and diarrhea.  Genitourinary:  Negative for difficulty urinating, dysuria, vaginal bleeding and vaginal discharge.  Neurological:  Positive for dizziness and headaches.   Physical Exam   Blood pressure (!) 109/59, pulse 65, temperature 98 F (36.7 C), resp. rate 18, last  menstrual period 03/04/2023, currently breastfeeding. Orthostatic Vitals for the past 48 hrs (Last 6 readings):  BP Pulse BP- Standing at 0 minutes Pulse- Standing at 0 minutes BP- Sitting Pulse- Sitting BP- Lying Pulse- Lying  05/21/23 0846 (!) 109/59 65 -- -- -- -- -- --  05/21/23 1157 -- -- -- -- -- -- 124/67 58  05/21/23 1158 -- -- -- -- 120/73 57 -- --  05/21/23 1159 -- -- 113/76 67 -- -- -- --    Physical Exam Constitutional:      General: She is not in acute distress.    Appearance: Normal appearance.  HENT:     Head: Normocephalic and atraumatic.  Eyes:     Conjunctiva/sclera: Conjunctivae normal.  Cardiovascular:     Rate and Rhythm: Normal rate and regular rhythm.     Heart sounds: Normal heart sounds.  Pulmonary:     Effort: Pulmonary effort is normal. No respiratory distress.  Neurological:     Mental Status: She is alert and oriented to person, place, and time.     Motor: Weakness present.     Comments: Right side weakness  Psychiatric:        Mood and Affect: Mood normal.    MAU Course  Procedures Results for orders placed or performed during the hospital encounter of 05/21/23 (from the past 24 hours)  Glucose, capillary     Status: None   Collection Time: 05/21/23  9:44 AM  Result Value Ref Range   Glucose-Capillary 91 70 - 99 mg/dL  Urinalysis, Routine w reflex microscopic -Urine, Clean Catch     Status: Abnormal   Collection Time: 05/21/23  9:47 AM  Result Value Ref Range   Color, Urine YELLOW YELLOW   APPearance TURBID (A) CLEAR   Specific Gravity, Urine >1.030 (H) 1.005 - 1.030   pH 5.5 5.0 - 8.0   Glucose, UA NEGATIVE NEGATIVE mg/dL   Hgb urine dipstick NEGATIVE NEGATIVE   Bilirubin Urine SMALL (A) NEGATIVE   Ketones, ur NEGATIVE NEGATIVE mg/dL   Protein, ur NEGATIVE NEGATIVE mg/dL   Nitrite NEGATIVE NEGATIVE   Leukocytes,Ua NEGATIVE NEGATIVE  Urinalysis, Microscopic (reflex)     Status: Abnormal   Collection Time: 05/21/23  9:47 AM  Result  Value Ref Range   RBC / HPF NONE  SEEN 0 - 5 RBC/hpf   WBC, UA 0-5 0 - 5 WBC/hpf   Bacteria, UA MANY (A) NONE SEEN   Squamous Epithelial / HPF 11-20 0 - 5 /HPF   Mucus PRESENT    Amorphous Crystal PRESENT     MDM Exam EKG CBG Labs: UA, CBC/D, CMP, UC CT Scan Consult-Neurology Start IV, LR Bolus Antiemetic Magnesium MRI with MR Venogram Assessment and Plan  33 year old [id number] at 8.6 weeks Dizziness Visual Changes Right Side Weakness  -Reviewed POC with patient. -Exam performed and findings discussed.  -Removed scopolamine patch and discussed how symptoms are reflective of some of side effects associated with this drug.  -Labs ordered.  -Will send for Head CT. -EKG performed and returns negative.  -CBG normal.   Jessica L Emly 05/21/2023, 10:00 AM   Reassessment (10:59 AM) -[facility] Neurology paged.  -APP-Casey, NP returns call and patient status reviewed. States will review with Dr. Amada Jupiter who will likely call back and provide further guidance.  Reassessment (12:12 PM) Dr. Amada Jupiter returns call and informed of patient status, evaluation, interventions, and results. States he has reviewed patient chart including CT results and Advises: *Likely complicated migraine *Give Reglan 10mg /mL *If persist then proceed with Head MRI with MR venogram and give Magnesium 2g.  *Please feel free to contact for further support/guidance.  -Provider appreciative and patient updated on POC. -Insert IV and give LR f/b Reglan -Monitor and reassess.   Reassessment (2:23 PM) -Patient reports improvement in nausea, but dizziness and visual disturbances persists. -Magnesium ordered. -MRI with MR Veno also ordered. -Patient updated on plan.   Reassessment (5:39 PM) -Patient reports return of nausea. -States she received some relief from dizziness with magnesium dosing. However, she continues to experience visual disturbances.   -Discussed plan to give Zofran for nausea and discussed  c/o dizziness. -Patient requesting ice chips and will give a few. -Still awaiting MRI.  Reassessment (8:11 PM) -Patient has completed MRI and results pending. -Report given to M. Mayford Knife, CNM  Cherre Robins MSN, CNM Advanced Practice Provider, Center for [facility]  MR Venogram Head Result Date: 05/21/2023 CLINICAL DATA:  Vertigo, central Right Side Tingling EXAM: MR VENOGRAM HEAD WITHOUT CONTRAST TECHNIQUE: Angiographic images of the intracranial venous structures were acquired using MRV technique without intravenous contrast. COMPARISON:  None Available. FINDINGS: No evidence of dural venous sinus thrombosis. Specifically, the superior sagittal, straight, transverse, and sigmoid sinuses are patent. Visualized deep cerebral veins are patent. IMPRESSION: No evidence of dural venous sinus thrombosis. Electronically Signed   By: Feliberto Harts M.D.   On: 05/21/2023 21:27   MR ANGIO HEAD WO CONTRAST Result Date: 05/21/2023 CLINICAL DATA:  Diplopia EXAM: MRA HEAD WITHOUT CONTRAST TECHNIQUE: Angiographic images of the Circle of Willis were acquired using MRA technique without intravenous contrast. COMPARISON:  Same day CT head. FINDINGS: Anterior circulation: Bilateral intracranial ICAs, MCAs and ACAs are patent without proximal hemodynamically significant stenosis. No aneurysm identified. Posterior circulation: Bilateral intradural vertebral arteries, basilar artery and bilateral posterior cerebral arteries are patent without proximal hemodynamically significant stenosis. No aneurysm identified. IMPRESSION: 1. No large vessel occlusion or proximal hemodynamically significant stenosis. 2. No aneurysm identified. Electronically Signed   By: Feliberto Harts M.D.   On: 05/21/2023 21:24   CT Head Wo Contrast Result Date: 05/21/2023 CLINICAL DATA:  Diplopia. Right-sided weakness and numbness. Dizziness and tingling. EXAM: CT HEAD WITHOUT CONTRAST TECHNIQUE: Contiguous axial images were obtained  from the base of the skull through the vertex without  intravenous contrast. RADIATION DOSE REDUCTION: This exam was performed according to the departmental dose-optimization program which includes automated exposure control, adjustment of the mA and/or kV according to patient size and/or use of iterative reconstruction technique. COMPARISON:  None Available. FINDINGS: Brain: The brain shows a normal appearance without evidence of malformation, atrophy, old or acute small or large vessel infarction, mass lesion, hemorrhage, hydrocephalus or extra-axial collection. Vascular: No hyperdense vessel. No evidence of atherosclerotic calcification. Skull: Normal.  No traumatic finding.  No focal bone lesion. Sinuses/Orbits: No significant sinus disease. Retention cyst in the left division of the sphenoid sinus, not likely clinical relevance. Orbits negative. Other: None significant IMPRESSION: Normal head CT. Retention cyst in the left division of the sphenoid sinus, not likely of clinical relevance. Electronically Signed   By: Paulina Fusi M.D.   On: 05/21/2023 11:55   Reviewed normal MRI Discussed the dizziness is likely representing Vertigo Reviewed Meclizine and Epley maneuver Discussed if it persists or recurs, may want to see an ENT  A:  Single IUP at 108w6d       Tingling        Vertigo        Nausea and vomiting  P;   Discharge home        Rx Meclizine for vertigo        Rx Reglan and zofran for nausea        Follow up in office        Encouraged to return if she develops worsening of symptoms, increase in pain, fever, or other concerning symptoms.   Aviva Signs, CNM

## 2023-05-21 NOTE — MAU Note (Signed)
.  Valerie [name] is a 33 y.o. at [redacted]w[redacted]d here in MAU reporting: has been having n/v since last week. Treating with scope patch and phenergan. Pt a stated she took her phenergan last night and went to bed woke up vomited for 4 hour and it has stopped but c/o now of having some blurry vision, dizziness  and tingling on the right side of her body.   LMP:  Onset of complaint: this morning Pain score: 0 Vitals:   05/21/23 0846  BP: (!) 109/59  Pulse: 65  Resp: 18  Temp: 98 F (36.7 C)     FHT: n/a  Lab orders placed from triage: u/a

## 2023-05-22 DIAGNOSIS — R112 Nausea with vomiting, unspecified: Secondary | ICD-10-CM | POA: Diagnosis not present

## 2023-05-22 DIAGNOSIS — R202 Paresthesia of skin: Secondary | ICD-10-CM

## 2023-05-22 DIAGNOSIS — Z3A08 8 weeks gestation of pregnancy: Secondary | ICD-10-CM

## 2023-05-22 DIAGNOSIS — R42 Dizziness and giddiness: Secondary | ICD-10-CM | POA: Diagnosis not present

## 2023-05-22 DIAGNOSIS — O26891 Other specified pregnancy related conditions, first trimester: Secondary | ICD-10-CM | POA: Diagnosis not present

## 2023-05-22 LAB — URINE CULTURE

## 2023-05-22 MED ORDER — MECLIZINE HCL 12.5 MG PO TABS: 12.5000 mg | ORAL_TABLET | Freq: Three times a day (TID) | ORAL | 0 refills | Status: DC | PRN

## 2023-05-22 MED ORDER — ONDANSETRON 4 MG PO TBDP
4.0000 mg | ORAL_TABLET | Freq: Four times a day (QID) | ORAL | 0 refills | Status: DC | PRN
Start: 2023-05-22 — End: 2023-11-11

## 2023-05-22 MED ORDER — METOCLOPRAMIDE HCL 10 MG PO TABS: 10.0000 mg | ORAL_TABLET | Freq: Four times a day (QID) | ORAL | 0 refills | Status: DC

## 2023-05-23 DIAGNOSIS — Z3480 Encounter for supervision of other normal pregnancy, unspecified trimester: Secondary | ICD-10-CM | POA: Diagnosis not present

## 2023-05-27 DIAGNOSIS — Z3481 Encounter for supervision of other normal pregnancy, first trimester: Secondary | ICD-10-CM | POA: Diagnosis not present

## 2023-06-02 DIAGNOSIS — O26899 Other specified pregnancy related conditions, unspecified trimester: Secondary | ICD-10-CM | POA: Diagnosis not present

## 2023-06-24 DIAGNOSIS — Z3481 Encounter for supervision of other normal pregnancy, first trimester: Secondary | ICD-10-CM | POA: Diagnosis not present

## 2023-06-24 DIAGNOSIS — Z3A13 13 weeks gestation of pregnancy: Secondary | ICD-10-CM | POA: Diagnosis not present

## 2023-06-24 DIAGNOSIS — Z36 Encounter for antenatal screening for chromosomal anomalies: Secondary | ICD-10-CM | POA: Diagnosis not present

## 2023-06-24 DIAGNOSIS — Z3401 Encounter for supervision of normal first pregnancy, first trimester: Secondary | ICD-10-CM | POA: Diagnosis not present

## 2023-08-01 DIAGNOSIS — R634 Abnormal weight loss: Secondary | ICD-10-CM | POA: Diagnosis not present

## 2023-08-01 DIAGNOSIS — Z36 Encounter for antenatal screening for chromosomal anomalies: Secondary | ICD-10-CM | POA: Diagnosis not present

## 2023-08-01 DIAGNOSIS — Z3A19 19 weeks gestation of pregnancy: Secondary | ICD-10-CM | POA: Diagnosis not present

## 2023-08-01 DIAGNOSIS — Z3482 Encounter for supervision of other normal pregnancy, second trimester: Secondary | ICD-10-CM | POA: Diagnosis not present

## 2023-08-21 ENCOUNTER — Encounter (INDEPENDENT_AMBULATORY_CARE_PROVIDER_SITE_OTHER): Payer: Self-pay | Admitting: Otolaryngology

## 2023-08-21 ENCOUNTER — Ambulatory Visit (INDEPENDENT_AMBULATORY_CARE_PROVIDER_SITE_OTHER): Payer: PRIVATE HEALTH INSURANCE | Admitting: Otolaryngology

## 2023-08-21 VITALS — BP 121/78 | HR 72 | Ht 65.0 in | Wt 221.0 lb

## 2023-08-21 DIAGNOSIS — J3089 Other allergic rhinitis: Secondary | ICD-10-CM

## 2023-08-21 DIAGNOSIS — R42 Dizziness and giddiness: Secondary | ICD-10-CM

## 2023-08-21 DIAGNOSIS — J3489 Other specified disorders of nose and nasal sinuses: Secondary | ICD-10-CM

## 2023-08-21 DIAGNOSIS — J0181 Other acute recurrent sinusitis: Secondary | ICD-10-CM

## 2023-08-21 DIAGNOSIS — J342 Deviated nasal septum: Secondary | ICD-10-CM | POA: Diagnosis not present

## 2023-08-21 DIAGNOSIS — J343 Hypertrophy of nasal turbinates: Secondary | ICD-10-CM | POA: Diagnosis not present

## 2023-08-21 DIAGNOSIS — R0981 Nasal congestion: Secondary | ICD-10-CM

## 2023-08-21 DIAGNOSIS — F17210 Nicotine dependence, cigarettes, uncomplicated: Secondary | ICD-10-CM

## 2023-08-21 MED ORDER — AZELASTINE HCL 0.1 % NA SOLN: 2.0000 | Freq: Every day | NASAL | 12 refills | Status: DC

## 2023-08-21 MED ORDER — TRIAMCINOLONE ACETONIDE 55 MCG/ACT NA AERO: 2.0000 | INHALATION_SPRAY | Freq: Every day | NASAL | 12 refills | Status: DC

## 2023-08-21 NOTE — Patient Instructions (Signed)
   Use two sprays of nasacort in each nostril daily; right after, use astelin spray two sprays each nostril twice per day -- use it after your deliver; check with your OB or pediatrician about lactation before you use them

## 2023-08-21 NOTE — Progress Notes (Signed)
 Dear Dr. Elisha Guillaume, Here is my assessment for our mutual patient, Valerie [name]. Thank you for allowing me the opportunity to care for your patient. Please do not hesitate to contact me should you have any other questions. Sincerely, Dr. Milon Aloe  Otolaryngology Clinic Note  HISTORY: Valerie [name] is a 33 y.o. female kindly referred by Dr. Elisha Guillaume for evaluation of chronic sinusitis and ear fullness  Initial visit (08/2023): Woke up in March with facial numbness on hemiface, blurred vision, tingling and nausea and went to the ED. Had extensive workup and noted to have vertigo as well, for which got a scopolamine  patch and this resolved her symptoms. She reports that the vertigo improved, but was slow and about a month later, she felt normal. No history of vertigo or anything since then. No otologic symptoms or vertigo. Used meclizine  but have not in a couple of months  She also wishes to discuss that she has right nasal congestion and right ear fullness. Denies hearing loss, drainage, tinnitus. She reports that she does have AR symptoms, and when she has a cold, harder to clear right side of her nose. Going on for several years. Does have sinus infections in spring and fall (~2/year) when she gets facial pressure and pain (max, ethmoid) and discolored drainage. Antibiotics do help. In between episodes, no significant sinus symptoms. No h/o vertex headaches.  Allergy testing has not been done. No previous sinonasal surgery.  She typically does not use any nasal medications; tried flonase, but worse eye pressure so stopped; she does use sudafed and does help. Does have FHX of glaucoma  She is [redacted] weeks pregnant  GLP-1: no AP/AC: no  Tobacco: former, quit 2024  PMHx: GAD/MDD,   RADIOGRAPHIC EVALUATION AND INDEPENDENT REVIEW OF OTHER RECORDS: [name] (05/28/2023): noted sinus problem (?) - sphenoid cyst(?) and vertigo; Rx: ref to ENT and meclizine  PRN CBC and CMP 05/21/2023: WBC 6.9,  Hgb 12.1, Plt 246; Eos 0; BUN/Cr 7/0.69 CTH 05/21/2023 independently interpreted: sphenoid sinus likely retention cyst on left, pedicled along floor; paranasal sinuses otherwise clear; no mastoid or ME effusion noted; cuts thick so suboptimal for eval of ears - no otic capsule or ossicular chain abnormality noted MRA 05/21/2023 and MRV 05/21/2023 independently interpreted with respect to ears: no dural venous sinus thrombosis; sigmoid and transverse sinuses patent; no mastoid effusion noted 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   BV (bacterial vaginosis)    Depression    Eczema    First degree heart block    Gastric ulcer    2000   Heart murmur    Infection    UTI, chronic sinus inf   Migraine    Mitral valve regurgitation    Ovarian cyst    Tricuspid valve regurgitation    UTI (urinary tract infection)    Past Surgical History:  Procedure Laterality Date   ADENOIDECTOMY     COLPOSCOPY     CRYOTHERAPY     LEEP     MYRINGOTOMY WITH TUBE PLACEMENT     TUBAL LIGATION N/A 04/01/2018   Procedure: POST PARTUM TUBAL LIGATION;  Surgeon: Janeane Mealy, MD;  Location: [facility];  Service: Gynecology;  Laterality: N/A;   Family History  Problem Relation Age of Onset   Hypertension Mother    Diabetes Father  Hypertension Father    Cancer Paternal Aunt        female   Diabetes Paternal Uncle    Diabetes Paternal Grandmother    Hypertension Paternal Grandmother    Diabetes Paternal Grandfather    Hypertension Paternal Grandfather    Social History   Tobacco Use   Smoking status: Every Day    Current packs/day: 0.00    Types: Cigarettes    Last attempt to quit: 08/05/2017    Years since quitting: 6.0   Smokeless tobacco: Never  Substance Use Topics   Alcohol use: Yes    Comment: rarely   Allergies  Allergen Reactions   Augmentin [Amoxicillin-Pot Clavulanate]  Diarrhea and Nausea And Vomiting        Ciprofloxacin Hives   Doxycycline Hives   Hydrocodone Hives   Prednisone     Had a reaction to high doses and was told not to take again   Raspberry Swelling    Throat swelling   Current Outpatient Medications  Medication Sig Dispense Refill   azelastine (ASTELIN) 0.1 % nasal spray Place 2 sprays into both nostrils daily. Use in each nostril as directed 30 mL 12   meclizine  (ANTIVERT ) 12.5 MG tablet Take 1 tablet (12.5 mg total) by mouth 3 (three) times daily as needed for dizziness. 30 tablet 0   Menthol , Topical Analgesic, (BIOFREEZE) 4 % GEL Apply 1 application topically 4 (four) times daily as needed (For back pain.).     metoCLOPramide  (REGLAN ) 10 MG tablet Take 1 tablet (10 mg total) by mouth every 6 (six) hours. 30 tablet 0   ondansetron  (ZOFRAN -ODT) 4 MG disintegrating tablet Take 1 tablet (4 mg total) by mouth every 6 (six) hours as needed for nausea. 20 tablet 0   Prenatal Vit-Fe Fumarate-FA (PREPLUS) 27-1 MG TABS Take 1 tablet by mouth daily. 30 tablet 13   promethazine  (PHENERGAN ) 12.5 MG tablet Take 1 tablet (12.5 mg total) by mouth every 6 (six) hours as needed for nausea or vomiting. 30 tablet 0   scopolamine  (TRANSDERM-SCOP) 1 MG/3DAYS Place 1 patch (1.5 mg total) onto the skin every 3 (three) days. 10 patch 1   triamcinolone (NASACORT) 55 MCG/ACT AERO nasal inhaler Place 2 sprays into the nose daily. 1 each 12   No current facility-administered medications for this visit.   BP 121/78   Pulse 72   Ht 5\' 5"  (1.651 m)   Wt 221 lb (100.2 kg)   LMP 03/04/2023   SpO2 97%   BMI 36.78 kg/m   PHYSICAL EXAM:  BP 121/78   Pulse 72   Ht 5\' 5"  (1.651 m)   Wt 221 lb (100.2 kg)   LMP 03/04/2023   SpO2 97%   BMI 36.78 kg/m    Salient findings:  CN II-XII intact Bilateral EAC clear and TM intact with well pneumatized middle ear spaces; left posterior myringosclerosis Weber 512: mid Rinne 512: AC > BC b/l  Nose: Anterior  rhinoscopy reveals septum deviates right, left > right inferior turbinate hypertrophy.  Nasal endoscopy was indicated to better evaluate the nose and paranasal sinuses, given the patient's history and exam findings, and is detailed below. No lesions of oral cavity/oropharynx No obviously palpable neck masses/lymphadenopathy/thyromegaly No respiratory distress or stridor  No nystagmus, gait normal  PROCEDURE:  Prior to initiating any procedures, risks/benefits/alternatives were explained to the patient and verbal consent obtained. Diagnostic Nasal Endoscopy Pre-procedure diagnosis: nasal congestion, nasal obstruction Post-procedure diagnosis: same Indication: See pre-procedure diagnosis and physical exam above  Complications: None apparent EBL: 0 mL Anesthesia: Lidocaine  4% and topical decongestant was topically sprayed in each nasal cavity  Description of Procedure:  Patient was identified. A rigid 30 degree endoscope was utilized to evaluate the sinonasal cavities, mucosa, sinus ostia and turbinates and septum.  Overall, signs of mucosal inflammation are not noted.  Also noted are right septal deviation with large spur.  No mucopurulence, polyps, or masses noted.   Right Middle meatus: clear Right SE Recess: clear Left MM: clear Left SE Recess: clear No masses over ET b/l  CPT CODE -- 31231 - Mod 25   ASSESSMENT:  33 y.o. with:  1. Hypertrophy of both inferior nasal turbinates   2. Nasal congestion   3. Nasal obstruction   4. Nasal septal deviation   5. Other acute recurrent sinusitis   6. Non-seasonal allergic rhinitis, unspecified trigger   7. Vertigo    Based on Sx, episode of vertigo sounds like a vestibular migraine v/s could be vestibular neuritis. Sx completely resolved; based on this, we discussed further w/u with audio or dedicated MRI IAC but she declined. She will call us  should facial symptoms or otologic sx recur  From nasal standpoint, she does have a fairly  significant right deviated septum and other sx appear to be consistent with recurrent sinusitis and AR. Not having frequent vertex headaches so do not think sphenoidotomy is warranted  Will start on medical management after pregnancy (she'll check with OB If any issues with sprays during lactation)  - Consider daily rinses - Nasacort daily; astelin daily - see above - d/w pt f/u, opted PRN  See below regarding exact medications prescribed this encounter including dosages and route: Meds ordered this encounter  Medications   triamcinolone (NASACORT) 55 MCG/ACT AERO nasal inhaler    Sig: Place 2 sprays into the nose daily.    Dispense:  1 each    Refill:  12   azelastine (ASTELIN) 0.1 % nasal spray    Sig: Place 2 sprays into both nostrils daily. Use in each nostril as directed    Dispense:  30 mL    Refill:  12     Thank you for allowing me the opportunity to care for your patient. Please do not hesitate to contact me should you have any other questions.  Sincerely, Milon Aloe, MD Otolaryngologist (ENT), [facility] Phone: [phone] Fax: [phone]  MDM:  Level 4: [phone] Complexity/Problems addressed: mod Data complexity: mod - independent interpretation of imaging; review of notes, labs - Morbidity: mod  - Drug prescribed or managed: y  08/21/2023, 2:53 PM

## 2023-08-26 ENCOUNTER — Inpatient Hospital Stay (HOSPITAL_COMMUNITY)
Admission: AD | Admit: 2023-08-26 | Discharge: 2023-08-26 | Disposition: A | Discharge status: Home / Self Care | Payer: PRIVATE HEALTH INSURANCE | Attending: Family Medicine | Admitting: Family Medicine

## 2023-08-26 ENCOUNTER — Encounter (HOSPITAL_COMMUNITY): Payer: Self-pay | Admitting: Family Medicine

## 2023-08-26 DIAGNOSIS — N939 Abnormal uterine and vaginal bleeding, unspecified: Secondary | ICD-10-CM | POA: Diagnosis not present

## 2023-08-26 DIAGNOSIS — N760 Acute vaginitis: Secondary | ICD-10-CM

## 2023-08-26 DIAGNOSIS — Z3A22 22 weeks gestation of pregnancy: Secondary | ICD-10-CM

## 2023-08-26 DIAGNOSIS — O23592 Infection of other part of genital tract in pregnancy, second trimester: Secondary | ICD-10-CM | POA: Insufficient documentation

## 2023-08-26 DIAGNOSIS — O26892 Other specified pregnancy related conditions, second trimester: Secondary | ICD-10-CM | POA: Diagnosis not present

## 2023-08-26 DIAGNOSIS — O4402 Placenta previa specified as without hemorrhage, second trimester: Secondary | ICD-10-CM | POA: Insufficient documentation

## 2023-08-26 DIAGNOSIS — B9689 Other specified bacterial agents as the cause of diseases classified elsewhere: Secondary | ICD-10-CM | POA: Diagnosis not present

## 2023-08-26 DIAGNOSIS — O26852 Spotting complicating pregnancy, second trimester: Secondary | ICD-10-CM | POA: Diagnosis present

## 2023-08-26 LAB — URINALYSIS, ROUTINE W REFLEX MICROSCOPIC
Bilirubin Urine: NEGATIVE
Glucose, UA: NEGATIVE mg/dL
Hgb urine dipstick: NEGATIVE
Ketones, ur: 20 mg/dL — AB
Leukocytes,Ua: NEGATIVE
Nitrite: NEGATIVE
Protein, ur: 30 mg/dL — AB
Specific Gravity, Urine: 1.026 (ref 1.005–1.030)
pH: 5 (ref 5.0–8.0)

## 2023-08-26 LAB — WET PREP, GENITAL
Clue Cells Wet Prep HPF POC: NONE SEEN
Sperm: NONE SEEN
Trich, Wet Prep: NONE SEEN
WBC, Wet Prep HPF POC: >=10 — AB (ref <10)
Yeast Wet Prep HPF POC: NONE SEEN

## 2023-08-26 MED ORDER — METRONIDAZOLE 500 MG PO TABS: 500.0000 mg | ORAL_TABLET | Freq: Two times a day (BID) | ORAL | 0 refills | Status: DC

## 2023-08-26 MED ORDER — ONDANSETRON HCL 4 MG PO TABS: 8.0000 mg | ORAL_TABLET | Freq: Two times a day (BID) | ORAL | 0 refills | Status: DC

## 2023-08-26 NOTE — MAU Note (Signed)
 Valerie [name] is a 33 y.o. at [redacted]w[redacted]d here in MAU reporting having placenta previa. Last night her abdomen just kept staying hard. That has not occurred today. However has felt crampy all day. Went to the BR at 1600 and had pink spotting. Dull cramping is constant but has intermittent sharp cramping.   LMP: na Onset of complaint: yesterday Pain score: 5-7 Vitals:   08/26/23 1919 08/26/23 1921  BP:  104/72  Pulse: 88   Resp: 17   Temp: 98.5 F (36.9 C)   SpO2: 99%      FHT: 140  Lab orders placed from triage: u/a

## 2023-08-26 NOTE — MAU Provider Note (Signed)
 Chief Complaint:  No chief complaint on file.   HPI   Valerie [name] is a 33 y.o. [id number] at [redacted]w[redacted]d who presents to maternity admissions reporting abdominal cramping and then she noticed after going to the BR she had some spotting while wiping. Denies any active VB, LOF, and reports fetal movements. Patient has known Placenta previa  Pregnancy Course: [facility]  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   BV (bacterial vaginosis)    Depression    Eczema    First degree heart block    Gastric ulcer    2000   Heart murmur    Infection    UTI, chronic sinus inf   Migraine    Mitral valve regurgitation    Ovarian cyst    Tricuspid valve regurgitation    UTI (urinary tract infection)    OB History  Gravida Para Term Preterm AB Living  5 2 2  2 2   SAB IAB Ectopic Multiple Live Births  2   0 2    # Outcome Date GA Lbr Len/2nd Weight Sex Type Anes PTL Lv  5 Current           4 Term 04/01/18 [redacted]w[redacted]d / 00:18 2780 g F Vag-Spont EPI  LIV  3 Term 07/07/13 [redacted]w[redacted]d 10:05 / 01:11 2850 g M Vag-Spont EPI  LIV  2 SAB 02/16/12 [redacted]w[redacted]d         1 SAB 12/17/03 [redacted]w[redacted]d          Past Surgical History:  Procedure Laterality Date   ADENOIDECTOMY     COLPOSCOPY     CRYOTHERAPY     LEEP     MYRINGOTOMY WITH TUBE PLACEMENT     TUBAL LIGATION N/A 04/01/2018   Procedure: POST PARTUM TUBAL LIGATION;  Surgeon: Janeane Mealy, MD;  Location: [facility];  Service: Gynecology;  Laterality: N/A;   Family History  Problem Relation Age of Onset   Hypertension Mother    Diabetes Father    Hypertension Father    Cancer Paternal Aunt        female   Diabetes Paternal Uncle    Diabetes Paternal Grandmother    Hypertension Paternal Grandmother    Diabetes Paternal Grandfather    Hypertension Paternal Grandfather    Social History   Tobacco Use   Smoking status: Every Day    Current packs/day:  0.00    Types: Cigarettes    Last attempt to quit: 08/05/2017    Years since quitting: 6.0   Smokeless tobacco: Never  Vaping Use   Vaping status: Former  Substance Use Topics   Alcohol use: Yes    Comment: rarely   Drug use: No   Allergies  Allergen Reactions   Augmentin [Amoxicillin-Pot Clavulanate] Diarrhea and Nausea And Vomiting        Ciprofloxacin Hives   Doxycycline Hives   Hydrocodone Hives   Prednisone     Had a reaction to high doses and was told not to take again   Raspberry Swelling    Throat swelling   Medications Prior to Admission  Medication Sig Dispense Refill Last Dose/Taking   azelastine  (ASTELIN ) 0.1 % nasal spray Place 2 sprays into both nostrils daily. Use in each nostril as directed 30 mL 12    meclizine  (ANTIVERT ) 12.5 MG tablet Take  1 tablet (12.5 mg total) by mouth 3 (three) times daily as needed for dizziness. 30 tablet 0    Menthol , Topical Analgesic, (BIOFREEZE) 4 % GEL Apply 1 application topically 4 (four) times daily as needed (For back pain.).      metoCLOPramide  (REGLAN ) 10 MG tablet Take 1 tablet (10 mg total) by mouth every 6 (six) hours. 30 tablet 0    ondansetron  (ZOFRAN -ODT) 4 MG disintegrating tablet Take 1 tablet (4 mg total) by mouth every 6 (six) hours as needed for nausea. 20 tablet 0    Prenatal Vit-Fe Fumarate-FA (PREPLUS) 27-1 MG TABS Take 1 tablet by mouth daily. 30 tablet 13    promethazine  (PHENERGAN ) 12.5 MG tablet Take 1 tablet (12.5 mg total) by mouth every 6 (six) hours as needed for nausea or vomiting. 30 tablet 0    scopolamine  (TRANSDERM-SCOP) 1 MG/3DAYS Place 1 patch (1.5 mg total) onto the skin every 3 (three) days. 10 patch 1    triamcinolone  (NASACORT ) 55 MCG/ACT AERO nasal inhaler Place 2 sprays into the nose daily. 1 each 12     I have reviewed patient's Past Medical Hx, Surgical Hx, Family Hx, Social Hx, medications and allergies.   ROS  Pertinent items noted in HPI and remainder of comprehensive ROS otherwise  negative.   PHYSICAL EXAM   Patient Vitals for the past 24 hrs:  BP Temp Pulse Resp SpO2 Height Weight  08/26/23 1921 104/72 -- -- -- -- -- --  08/26/23 1919 -- 98.5 F (36.9 C) 88 17 99 % 5\' 5"  (1.651 m) 102.9 kg    Constitutional: Well-developed, obese female in no acute distress.  Cardiovascular: normal rate & rhythm, warm and well-perfused Respiratory: normal effort, no problems with respiration noted GI: Abd soft, non-tender, gravid MS: Extremities nontender, no edema, normal ROM Neurologic: Alert and oriented x 4.  GU: no CVA tenderness Pelvic:  chaperoned by [place] Just RN SSE: No pooling, no evidence of vaginal bleeding, increased yellowish discharge with an odor present, cervix visually closed    Fetal Tracing: FHR 140 via Doppler     Labs: Results for orders placed or performed during the hospital encounter of 08/26/23 (from the past 24 hours)  Urinalysis, Routine w reflex microscopic -Urine, Clean Catch     Status: Abnormal   Collection Time: 08/26/23  7:45 PM  Result Value Ref Range   Color, Urine YELLOW YELLOW   APPearance CLEAR CLEAR   Specific Gravity, Urine 1.026 1.005 - 1.030   pH 5.0 5.0 - 8.0   Glucose, UA NEGATIVE NEGATIVE mg/dL   Hgb urine dipstick NEGATIVE NEGATIVE   Bilirubin Urine NEGATIVE NEGATIVE   Ketones, ur 20 (A) NEGATIVE mg/dL   Protein, ur 30 (A) NEGATIVE mg/dL   Nitrite NEGATIVE NEGATIVE   Leukocytes,Ua NEGATIVE NEGATIVE   RBC / HPF 0-5 0 - 5 RBC/hpf   WBC, UA 0-5 0 - 5 WBC/hpf   Bacteria, UA RARE (A) NONE SEEN   Squamous Epithelial / HPF 0-5 0 - 5 /HPF   Mucus PRESENT     Imaging:  No results found.  MDM & MAU COURSE  MDM:  HIGH  Vaginal spotting in pregnancy with placenta previa  -  Prenatal records reviewed -  Physical exam performed with Pelvic  - Vaginal cultures R/O Infection  ( c/w BV) -  U/A r/o UTI  -  FHR at 140   MAU Course: Orders Placed This Encounter  Procedures   Wet prep, genital   Urinalysis,  Routine w reflex microscopic -Urine, Clean Catch   Discharge patient Discharge disposition: 01-Home or Self Care; Discharge patient date: 08/26/2023    I have reviewed the patient chart and performed the physical exam . Medications ordered as stated below.  A/P as described below.  Counseling and education provided and patient agreeable  with plan as described below. Verbalized understanding.    ASSESSMENT   1. Vaginal spotting   2. [redacted] weeks gestation of pregnancy   3. Bacterial vaginosis     PLAN  Discharge home in stable condition with return precautions.   F/U with OB   See AVS for full description of information given to the patient including both verbal and written. Patient verbalized understanding and agrees with the plan as described above.      Allergies as of 08/26/2023       Reactions   Augmentin [amoxicillin-pot Clavulanate] Diarrhea, Nausea And Vomiting       Ciprofloxacin Hives   Doxycycline Hives   Hydrocodone Hives   Prednisone    Had a reaction to high doses and was told not to take again   Raspberry Swelling   Throat swelling        Medication List     TAKE these medications    azelastine  0.1 % nasal spray Commonly known as: ASTELIN  Place 2 sprays into both nostrils daily. Use in each nostril as directed   Biofreeze 4 % Gel Generic drug: Menthol  (Topical Analgesic) Apply 1 application topically 4 (four) times daily as needed (For back pain.).   meclizine  12.5 MG tablet Commonly known as: ANTIVERT  Take 1 tablet (12.5 mg total) by mouth 3 (three) times daily as needed for dizziness.   metoCLOPramide  10 MG tablet Commonly known as: REGLAN  Take 1 tablet (10 mg total) by mouth every 6 (six) hours.   metroNIDAZOLE  500 MG tablet Commonly known as: FLAGYL  Take 1 tablet (500 mg total) by mouth 2 (two) times daily.   ondansetron  4 MG disintegrating tablet Commonly known as: ZOFRAN -ODT Take 1 tablet (4 mg total) by mouth every 6 (six) hours as  needed for nausea.   ondansetron  4 MG tablet Commonly known as: Zofran  Take 2 tablets (8 mg total) by mouth 2 (two) times daily.   PrePLUS 27-1 MG Tabs Take 1 tablet by mouth daily.   promethazine  12.5 MG tablet Commonly known as: PHENERGAN  Take 1 tablet (12.5 mg total) by mouth every 6 (six) hours as needed for nausea or vomiting.   scopolamine  1 MG/3DAYS Commonly known as: TRANSDERM-SCOP Place 1 patch (1.5 mg total) onto the skin every 3 (three) days.   triamcinolone  55 MCG/ACT Aero nasal inhaler Commonly known as: NASACORT  Place 2 sprays into the nose daily.        Debbe Fail, MSN, [facility], [facility] [employer]

## 2023-08-27 LAB — GC/CHLAMYDIA PROBE AMP (~~LOC~~) NOT AT ARMC
Chlamydia: NEGATIVE
Comment: NEGATIVE
Comment: NORMAL
Neisseria Gonorrhea: NEGATIVE

## 2023-09-23 DIAGNOSIS — Z3482 Encounter for supervision of other normal pregnancy, second trimester: Secondary | ICD-10-CM | POA: Diagnosis not present

## 2023-10-02 ENCOUNTER — Encounter (HOSPITAL_COMMUNITY): Payer: Self-pay | Admitting: Family Medicine

## 2023-10-02 ENCOUNTER — Other Ambulatory Visit: Payer: Self-pay

## 2023-10-02 ENCOUNTER — Inpatient Hospital Stay (HOSPITAL_COMMUNITY)
Admission: AD | Admit: 2023-10-02 | Discharge: 2023-10-02 | Disposition: A | Discharge status: Home / Self Care | Payer: PRIVATE HEALTH INSURANCE | Attending: Obstetrics and Gynecology | Admitting: Obstetrics and Gynecology

## 2023-10-02 DIAGNOSIS — R109 Unspecified abdominal pain: Secondary | ICD-10-CM | POA: Diagnosis not present

## 2023-10-02 DIAGNOSIS — O99283 Endocrine, nutritional and metabolic diseases complicating pregnancy, third trimester: Secondary | ICD-10-CM | POA: Diagnosis not present

## 2023-10-02 DIAGNOSIS — D649 Anemia, unspecified: Secondary | ICD-10-CM | POA: Insufficient documentation

## 2023-10-02 DIAGNOSIS — Z3A28 28 weeks gestation of pregnancy: Secondary | ICD-10-CM | POA: Diagnosis not present

## 2023-10-02 DIAGNOSIS — O26893 Other specified pregnancy related conditions, third trimester: Secondary | ICD-10-CM | POA: Diagnosis not present

## 2023-10-02 DIAGNOSIS — O99013 Anemia complicating pregnancy, third trimester: Secondary | ICD-10-CM | POA: Diagnosis not present

## 2023-10-02 DIAGNOSIS — E876 Hypokalemia: Secondary | ICD-10-CM

## 2023-10-02 DIAGNOSIS — R1030 Lower abdominal pain, unspecified: Secondary | ICD-10-CM | POA: Insufficient documentation

## 2023-10-02 DIAGNOSIS — O26899 Other specified pregnancy related conditions, unspecified trimester: Secondary | ICD-10-CM

## 2023-10-02 DIAGNOSIS — R0602 Shortness of breath: Secondary | ICD-10-CM | POA: Diagnosis present

## 2023-10-02 DIAGNOSIS — Z113 Encounter for screening for infections with a predominantly sexual mode of transmission: Secondary | ICD-10-CM | POA: Diagnosis present

## 2023-10-02 LAB — URINALYSIS, ROUTINE W REFLEX MICROSCOPIC
Bilirubin Urine: NEGATIVE
Glucose, UA: NEGATIVE mg/dL
Hgb urine dipstick: NEGATIVE
Ketones, ur: NEGATIVE mg/dL
Leukocytes,Ua: NEGATIVE
Nitrite: NEGATIVE
Protein, ur: 30 mg/dL — AB
Specific Gravity, Urine: 1.029 (ref 1.005–1.030)
pH: 5 (ref 5.0–8.0)

## 2023-10-02 LAB — CBC
HCT: 29.2 % — ABNORMAL LOW (ref 36.0–46.0)
Hemoglobin: 9.8 g/dL — ABNORMAL LOW (ref 12.0–15.0)
MCH: 32.6 pg (ref 26.0–34.0)
MCHC: 33.6 g/dL (ref 30.0–36.0)
MCV: 97 fL (ref 80.0–100.0)
Platelets: 257 K/uL (ref 150–400)
RBC: 3.01 MIL/uL — ABNORMAL LOW (ref 3.87–5.11)
RDW: 15.1 % (ref 11.5–15.5)
WBC: 8.9 K/uL (ref 4.0–10.5)
nRBC: 0 % (ref 0.0–0.2)

## 2023-10-02 LAB — COMPREHENSIVE METABOLIC PANEL WITH GFR
ALT: 9 U/L (ref 0–44)
AST: 12 U/L — ABNORMAL LOW (ref 15–41)
Albumin: 2.6 g/dL — ABNORMAL LOW (ref 3.5–5.0)
Alkaline Phosphatase: 88 U/L (ref 38–126)
Anion gap: 10 (ref 5–15)
BUN: 6 mg/dL (ref 6–20)
CO2: 20 mmol/L — ABNORMAL LOW (ref 22–32)
Calcium: 9 mg/dL (ref 8.9–10.3)
Chloride: 107 mmol/L (ref 98–111)
Creatinine, Ser: 0.67 mg/dL (ref 0.44–1.00)
GFR, Estimated: >60 mL/min (ref >60)
Glucose, Bld: 93 mg/dL (ref 70–99)
Potassium: 3.3 mmol/L — ABNORMAL LOW (ref 3.5–5.1)
Sodium: 137 mmol/L (ref 135–145)
Total Bilirubin: 0.3 mg/dL (ref 0.0–1.2)
Total Protein: 6.2 g/dL — ABNORMAL LOW (ref 6.5–8.1)

## 2023-10-02 LAB — WET PREP, GENITAL
Clue Cells Wet Prep HPF POC: NONE SEEN
Sperm: NONE SEEN
Trich, Wet Prep: NONE SEEN
WBC, Wet Prep HPF POC: <10 (ref <10)
Yeast Wet Prep HPF POC: NONE SEEN

## 2023-10-02 MED ORDER — ACETAMINOPHEN 500 MG PO TABS
1000.0000 mg | ORAL_TABLET | Freq: Once | ORAL | Status: AC
  Administered 2023-10-02: 1000 mg via ORAL
  Filled 2023-10-02: qty 2

## 2023-10-02 MED ORDER — CYCLOBENZAPRINE HCL 5 MG PO TABS
10.0000 mg | ORAL_TABLET | Freq: Once | ORAL | Status: AC
  Administered 2023-10-02: 10 mg via ORAL
  Filled 2023-10-02: qty 2

## 2023-10-02 MED ORDER — CYCLOBENZAPRINE HCL 10 MG PO TABS: 10.0000 mg | ORAL_TABLET | Freq: Two times a day (BID) | ORAL | 0 refills | Status: DC | PRN

## 2023-10-02 MED ORDER — POTASSIUM CHLORIDE CRYS ER 20 MEQ PO TBCR: 20.0000 meq | EXTENDED_RELEASE_TABLET | Freq: Two times a day (BID) | ORAL | 0 refills | Status: DC

## 2023-10-02 NOTE — MAU Provider Note (Signed)
 Chief Complaint:  Abdominal Pain   HPI      Valerie [name] is a 33 y.o. [id number] at [redacted]w[redacted]d who presents to maternity admissions reporting she has lower abdominal pain and pressure over the last couple of days and has not taken any medication to help resolve the pain.  She also is complaining of difficulty with urinating has trouble starting a urine stream although she denies dysuria ,burning ,or pain with urination.  The pain she is describing today  is on the right mid abdominal area which wraps around to her back but she denies any flank pain.  She denies  obstetrical issues no vaginal bleeding, leaking fluid contractions, or  cramping and reports good fetal movements.   Pregnancy Course: [facility]  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   BV (bacterial vaginosis)    Depression    Eczema    First degree heart block    Gastric ulcer    2000   Heart murmur    Infection    UTI, chronic sinus inf   Migraine    Mitral valve regurgitation    Ovarian cyst    Tricuspid valve regurgitation    UTI (urinary tract infection)    OB History  Gravida Para Term Preterm AB Living  5 2 2  2 2   SAB IAB Ectopic Multiple Live Births  2   0 2    # Outcome Date GA Lbr Len/2nd Weight Sex Type Anes PTL Lv  5 Current           4 Term 04/01/18 [redacted]w[redacted]d / 00:18 2780 g F Vag-Spont EPI  LIV  3 Term 07/07/13 [redacted]w[redacted]d 10:05 / 01:11 2850 g M Vag-Spont EPI  LIV  2 SAB 02/16/12 [redacted]w[redacted]d         1 SAB 12/17/03 [redacted]w[redacted]d          Past Surgical History:  Procedure Laterality Date   ADENOIDECTOMY     COLPOSCOPY     CRYOTHERAPY     LEEP     MYRINGOTOMY WITH TUBE PLACEMENT     TUBAL LIGATION N/A 04/01/2018   Procedure: POST PARTUM TUBAL LIGATION;  Surgeon: Kandis Devaughn Sayres, MD;  Location: [facility];  Service: Gynecology;  Laterality: N/A;   Family History  Problem Relation Age of Onset   Hypertension  Mother    Diabetes Father    Hypertension Father    Cancer Paternal Aunt        female   Diabetes Paternal Uncle    Diabetes Paternal Grandmother    Hypertension Paternal Grandmother    Diabetes Paternal Grandfather    Hypertension Paternal Grandfather    Social History   Tobacco Use   Smoking status: Former    Current packs/day: 0.00    Types: Cigarettes    Quit date: 08/05/2017    Years since quitting: 6.1   Smokeless tobacco: Never  Vaping Use   Vaping status: Former  Substance Use Topics   Alcohol use: Yes    Comment: rarely   Drug use: No   Allergies  Allergen Reactions   Augmentin [Amoxicillin-Pot Clavulanate] Diarrhea and Nausea And Vomiting        Ciprofloxacin Hives   Doxycycline Hives   Hydrocodone Hives   Prednisone     Had a reaction to high  doses and was told not to take again   Raspberry Swelling    Throat swelling   No medications prior to admission.    I have reviewed patient's Past Medical Hx, Surgical Hx, Family Hx, Social Hx, medications and allergies.   ROS  Pertinent items noted in HPI and remainder of comprehensive ROS otherwise negative.   PHYSICAL EXAM  Patient Vitals for the past 24 hrs:  BP Temp Temp src Pulse Resp SpO2  10/02/23 1711 115/70 97.9 F (36.6 C) Oral 73 19 99 %  10/02/23 1404 124/69 -- -- 91 -- 100 %  10/02/23 1340 120/71 97.9 F (36.6 C) Oral 93 18 100 %    Constitutional: Well-developed, well-nourished female in no acute distress.  Cardiovascular: normal rate & rhythm, warm and well-perfused Respiratory: normal effort, no problems with respiration noted GI: Abd soft, non-tender, gravid, no pain reproducible on palpation MS: Extremities nontender, no edema, normal ROM Neurologic: Alert and oriented x 4.  GU: no CVA tenderness Pelvic: Deferred  Fetal Tracing: Cat 1 reactive for GA Baseline: 135-140 Variability: moderate  Accelerations: present Decelerations: absent Toco: occasional UI   Labs: Results for  orders placed or performed during the hospital encounter of 10/02/23 (from the past 24 hours)  Urinalysis, Routine w reflex microscopic -Urine, Clean Catch     Status: Abnormal   Collection Time: 10/02/23  2:47 PM  Result Value Ref Range   Color, Urine YELLOW YELLOW   APPearance TURBID (A) CLEAR   Specific Gravity, Urine 1.029 1.005 - 1.030   pH 5.0 5.0 - 8.0   Glucose, UA NEGATIVE NEGATIVE mg/dL   Hgb urine dipstick NEGATIVE NEGATIVE   Bilirubin Urine NEGATIVE NEGATIVE   Ketones, ur NEGATIVE NEGATIVE mg/dL   Protein, ur 30 (A) NEGATIVE mg/dL   Nitrite NEGATIVE NEGATIVE   Leukocytes,Ua NEGATIVE NEGATIVE   RBC / HPF 0-5 0 - 5 RBC/hpf   WBC, UA 0-5 0 - 5 WBC/hpf   Bacteria, UA RARE (A) NONE SEEN   Squamous Epithelial / HPF 0-5 0 - 5 /HPF   Mucus PRESENT    Amorphous Crystal PRESENT   CBC     Status: Abnormal   Collection Time: 10/02/23  2:47 PM  Result Value Ref Range   WBC 8.9 4.0 - 10.5 K/uL   RBC 3.01 (L) 3.87 - 5.11 MIL/uL   Hemoglobin 9.8 (L) 12.0 - 15.0 g/dL   HCT 70.7 (L) 63.9 - 53.9 %   MCV 97.0 80.0 - 100.0 fL   MCH 32.6 26.0 - 34.0 pg   MCHC 33.6 30.0 - 36.0 g/dL   RDW 84.8 88.4 - 84.4 %   Platelets 257 150 - 400 K/uL   nRBC 0.0 0.0 - 0.2 %  Comprehensive metabolic panel     Status: Abnormal   Collection Time: 10/02/23  2:47 PM  Result Value Ref Range   Sodium 137 135 - 145 mmol/L   Potassium 3.3 (L) 3.5 - 5.1 mmol/L   Chloride 107 98 - 111 mmol/L   CO2 20 (L) 22 - 32 mmol/L   Glucose, Bld 93 70 - 99 mg/dL   BUN 6 6 - 20 mg/dL   Creatinine, Ser 9.32 0.44 - 1.00 mg/dL   Calcium 9.0 8.9 - 89.6 mg/dL   Total Protein 6.2 (L) 6.5 - 8.1 g/dL   Albumin 2.6 (L) 3.5 - 5.0 g/dL   AST 12 (L) 15 - 41 U/L   ALT 9 0 - 44 U/L   Alkaline Phosphatase 88  38 - 126 U/L   Total Bilirubin 0.3 0.0 - 1.2 mg/dL   GFR, Estimated >39 >39 mL/min   Anion gap 10 5 - 15  Wet prep, genital     Status: None   Collection Time: 10/02/23  3:01 PM   Specimen: PATH Cytology Cervicovaginal  Ancillary Only  Result Value Ref Range   Yeast Wet Prep HPF POC NONE SEEN NONE SEEN   Trich, Wet Prep NONE SEEN NONE SEEN   Clue Cells Wet Prep HPF POC NONE SEEN NONE SEEN   WBC, Wet Prep HPF POC <10 <10   Sperm NONE SEEN     Imaging:  No results found.  MDM & MAU COURSE  MDM:  HIGH  - Abdominal pain at [redacted] weeks GA  CBC, CMP, U/A and vaginal swabs to r/o infection (labs were unremarkable with no evidence of infection )  Mild hypokalemia noted on CMP at 3.3 (will plan to discharge patient home on p.o. supplement)   NST for GA and Fetal well being (category 1 reactive tracing for gestational age)  Tylenol  and Flexeril  for pain (after reassessment patient was found sleeping comfortably in her bed with resolve of pain)  Will plan to discharge patient home with Flexeril  for likely musculoskeletal pain associated with the pregnancy  Patient to follow-up with OB provider   MAU Course: Orders Placed This Encounter  Procedures   Wet prep, genital   Urinalysis, Routine w reflex microscopic -Urine, Clean Catch   CBC   Comprehensive metabolic panel   Discharge patient Discharge disposition: 01-Home or Self Care; Discharge patient date: 10/02/2023   Meds ordered this encounter  Medications   acetaminophen  (TYLENOL ) tablet 1,000 mg   cyclobenzaprine  (FLEXERIL ) tablet 10 mg   potassium chloride  SA (KLOR-CON  M) 20 MEQ tablet    Sig: Take 1 tablet (20 mEq total) by mouth 2 (two) times daily for 2 days.    Dispense:  4 tablet    Refill:  0    Supervising Provider:   PRATT, TANYA S [[id number]]   cyclobenzaprine  (FLEXERIL ) 10 MG tablet    Sig: Take 1 tablet (10 mg total) by mouth 2 (two) times daily as needed for muscle spasms.    Dispense:  20 tablet    Refill:  0    Supervising Provider:   PRATT, TANYA S [[id number]]     I have reviewed the patient chart and performed the physical exam . I have ordered & interpreted the lab results and reviewed and interpreted the NST Medications  ordered as stated below.  A/P as described below.  Counseling and education provided and patient agreeable  with plan as described below. Verbalized understanding.    ASSESSMENT   1. Abdominal pain affecting pregnancy   2. [redacted] weeks gestation of pregnancy   3. Hypokalemia     PLAN  Discharge home in stable condition with return precautions.   See AVS for full description of information given to the patient including both verbal and written. Patient verbalized understanding and agrees with the plan as described above.     Allergies as of 10/02/2023       Reactions   Augmentin [amoxicillin-pot Clavulanate] Diarrhea, Nausea And Vomiting       Ciprofloxacin Hives   Doxycycline Hives   Hydrocodone Hives   Prednisone    Had a reaction to high doses and was told not to take again   Raspberry Swelling   Throat swelling        Medication  List     TAKE these medications    azelastine  0.1 % nasal spray Commonly known as: ASTELIN  Place 2 sprays into both nostrils daily. Use in each nostril as directed   Biofreeze 4 % Gel Generic drug: Menthol  (Topical Analgesic) Apply 1 application topically 4 (four) times daily as needed (For back pain.).   cyclobenzaprine  10 MG tablet Commonly known as: FLEXERIL  Take 1 tablet (10 mg total) by mouth 2 (two) times daily as needed for muscle spasms.   meclizine  12.5 MG tablet Commonly known as: ANTIVERT  Take 1 tablet (12.5 mg total) by mouth 3 (three) times daily as needed for dizziness.   metoCLOPramide  10 MG tablet Commonly known as: REGLAN  Take 1 tablet (10 mg total) by mouth every 6 (six) hours.   metroNIDAZOLE  500 MG tablet Commonly known as: FLAGYL  Take 1 tablet (500 mg total) by mouth 2 (two) times daily.   ondansetron  4 MG disintegrating tablet Commonly known as: ZOFRAN -ODT Take 1 tablet (4 mg total) by mouth every 6 (six) hours as needed for nausea.   ondansetron  4 MG tablet Commonly known as: Zofran  Take 2 tablets (8 mg  total) by mouth 2 (two) times daily.   potassium chloride  SA 20 MEQ tablet Commonly known as: KLOR-CON  M Take 1 tablet (20 mEq total) by mouth 2 (two) times daily for 2 days.   PrePLUS 27-1 MG Tabs Take 1 tablet by mouth daily.   promethazine  12.5 MG tablet Commonly known as: PHENERGAN  Take 1 tablet (12.5 mg total) by mouth every 6 (six) hours as needed for nausea or vomiting.   scopolamine  1 MG/3DAYS Commonly known as: TRANSDERM-SCOP Place 1 patch (1.5 mg total) onto the skin every 3 (three) days.   triamcinolone  55 MCG/ACT Aero nasal inhaler Commonly known as: NASACORT  Place 2 sprays into the nose daily.        Olam Dalton, MSN, [facility], [facility] [employer]

## 2023-10-02 NOTE — MAU Note (Signed)
 Valerie [name] is a 33 y.o. at [redacted]w[redacted]d here in MAU reporting: she's having lower abdomen pain that began today, states was initially pressure yesterday.  State has difficulty starting urine stream.  Reports she's also having pain in right rib cage that began on Tuesday and worsened today.  Reports pain in rib cage is sharp and constant.  States she's also been having SOB, but initially attributed it to anemia but hasn't resolved despite starting Iron.  States last good BM was Monday despite taking Colace, was taking Miralax three times per week but stopped when started Coloace. Denies VB or LOF.  Endorses +FM.  LMP: 03/04/2023 Onset of complaint: Tuesday Pain score: 6 abdomen & 7 rib cage Vitals:   10/02/23 1340  BP: 120/71  Pulse: 93  Resp: 18  Temp: 97.9 F (36.6 C)  SpO2: 100%     FHT: 150 bpm  Lab orders placed from triage: UA

## 2023-10-02 NOTE — Discharge Instructions (Signed)
 Follow up with your [facility] Provider

## 2023-10-03 LAB — GC/CHLAMYDIA PROBE AMP (~~LOC~~) NOT AT ARMC
Chlamydia: NEGATIVE
Comment: NEGATIVE
Comment: NORMAL
Neisseria Gonorrhea: NEGATIVE

## 2023-11-10 ENCOUNTER — Encounter (HOSPITAL_COMMUNITY): Payer: Self-pay | Admitting: Obstetrics and Gynecology

## 2023-11-10 ENCOUNTER — Inpatient Hospital Stay (HOSPITAL_COMMUNITY)
Admission: AD | Admit: 2023-11-10 | Discharge: 2023-11-11 | Disposition: A | Discharge status: Home / Self Care | Payer: PRIVATE HEALTH INSURANCE | Attending: Obstetrics and Gynecology | Admitting: Obstetrics and Gynecology

## 2023-11-10 ENCOUNTER — Other Ambulatory Visit: Payer: Self-pay

## 2023-11-10 DIAGNOSIS — O4690 Antepartum hemorrhage, unspecified, unspecified trimester: Secondary | ICD-10-CM

## 2023-11-10 DIAGNOSIS — Z3A33 33 weeks gestation of pregnancy: Secondary | ICD-10-CM | POA: Insufficient documentation

## 2023-11-10 DIAGNOSIS — O4693 Antepartum hemorrhage, unspecified, third trimester: Secondary | ICD-10-CM | POA: Insufficient documentation

## 2023-11-10 DIAGNOSIS — Z3689 Encounter for other specified antenatal screening: Secondary | ICD-10-CM

## 2023-11-10 NOTE — MAU Note (Signed)
 Valerie [name] is a 33 y.o. at [redacted]w[redacted]d here in MAU reporting: has been having occasional abd pain ctx since last week. Today she worked all day and was feeling tired and having some cramps. Tonight when she got home she went to the [facility] and had some bleeding in the toilet and when she wiped. Now feeling more cramping in her lower  abd and back. Reports fetal movement not as strong but regular Had one episode of vomiting this afternoon. Also stated she had placenta previa that has resolved.  LMP:  Onset of complaint:  Pain score: 5-6 Vitals:   11/10/23 2303  BP: 121/72  Pulse: 86  Resp: 18  Temp: 97.8 F (36.6 C)     FHT: 142  Lab orders placed from triage: u/a.

## 2023-11-11 ENCOUNTER — Inpatient Hospital Stay (HOSPITAL_BASED_OUTPATIENT_CLINIC_OR_DEPARTMENT_OTHER): Payer: PRIVATE HEALTH INSURANCE

## 2023-11-11 DIAGNOSIS — Z3689 Encounter for other specified antenatal screening: Secondary | ICD-10-CM

## 2023-11-11 DIAGNOSIS — Z3A33 33 weeks gestation of pregnancy: Secondary | ICD-10-CM | POA: Diagnosis not present

## 2023-11-11 DIAGNOSIS — O4693 Antepartum hemorrhage, unspecified, third trimester: Secondary | ICD-10-CM | POA: Diagnosis present

## 2023-11-11 DIAGNOSIS — O4690 Antepartum hemorrhage, unspecified, unspecified trimester: Secondary | ICD-10-CM | POA: Diagnosis not present

## 2023-11-11 DIAGNOSIS — O4443 Low lying placenta NOS or without hemorrhage, third trimester: Secondary | ICD-10-CM

## 2023-11-11 LAB — URINALYSIS, ROUTINE W REFLEX MICROSCOPIC
Bacteria, UA: NONE SEEN
Bilirubin Urine: NEGATIVE
Glucose, UA: NEGATIVE mg/dL
Ketones, ur: NEGATIVE mg/dL
Leukocytes,Ua: NEGATIVE
Nitrite: NEGATIVE
Protein, ur: 30 mg/dL — AB
Specific Gravity, Urine: 1.024 (ref 1.005–1.030)
pH: 6 (ref 5.0–8.0)

## 2023-11-11 LAB — WET PREP, GENITAL
Clue Cells Wet Prep HPF POC: NONE SEEN
Sperm: NONE SEEN
Trich, Wet Prep: NONE SEEN
WBC, Wet Prep HPF POC: <10 (ref <10)
Yeast Wet Prep HPF POC: NONE SEEN

## 2023-11-11 LAB — FETAL FIBRONECTIN: Fetal Fibronectin: POSITIVE — AB

## 2023-11-11 LAB — GC/CHLAMYDIA PROBE AMP (~~LOC~~) NOT AT ARMC
Chlamydia: NEGATIVE
Comment: NEGATIVE
Comment: NORMAL
Neisseria Gonorrhea: NEGATIVE

## 2023-11-11 MED ORDER — CYCLOBENZAPRINE HCL 10 MG PO TABS: 10.0000 mg | ORAL_TABLET | Freq: Three times a day (TID) | ORAL | 0 refills | Status: DC | PRN

## 2023-11-11 MED ORDER — CALCIUM CARBONATE ANTACID 500 MG PO CHEW
800.0000 mg | CHEWABLE_TABLET | Freq: Once | ORAL | Status: AC
  Administered 2023-11-11: 800 mg via ORAL
  Filled 2023-11-11: qty 4

## 2023-11-11 MED ORDER — CYCLOBENZAPRINE HCL 5 MG PO TABS
10.0000 mg | ORAL_TABLET | Freq: Once | ORAL | Status: AC
  Administered 2023-11-11: 10 mg via ORAL
  Filled 2023-11-11: qty 2

## 2023-11-11 NOTE — MAU Provider Note (Signed)
 MAU Provider Note  Chief Complaint: Vaginal Bleeding, Back Pain, and Abdominal Pain   Event Date/Time   First Provider Initiated Contact with Patient 11/10/23 2326      SUBJECTIVE HPI: Valerie [name] is a 33 y.o. [id number] at [redacted]w[redacted]d by early ultrasound who presents to maternity admissions reporting vaginal bleeding, back/abdominal pain. Pregnancy c/b placenta previa (now resolved), hx BTL with filshie clips. Receives [facility] with [facility].  Patient presents with vaginal bleeding that started this evening. On toilet tissue, drops in toilet, and drop on underwear. Not currently wearing a pad. Having lower back and lower abdominal pains as well. Having irregular contractions over past several days as well. Denies urinary symptoms, change in discharge, recent intercourse. +FM. Hx two prior term deliveries.  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   BV (bacterial vaginosis)    Depression    Eczema    First degree heart block    Gastric ulcer    2000   Heart murmur    Infection    UTI, chronic sinus inf   Migraine    Mitral valve regurgitation    Ovarian cyst    Tricuspid valve regurgitation    UTI (urinary tract infection)    Past Surgical History:  Procedure Laterality Date   ADENOIDECTOMY     COLPOSCOPY     CRYOTHERAPY     LEEP     MYRINGOTOMY WITH TUBE PLACEMENT     TUBAL LIGATION N/A 04/01/2018   Procedure: POST PARTUM TUBAL LIGATION;  Surgeon: Kandis Devaughn Sayres, MD;  Location: [facility];  Service: Gynecology;  Laterality: N/A;   Social History   Socioeconomic History   Marital status: Legally Separated    Spouse name: Not on file   Number of children: Not on file   Years of education: Not on file   Highest education level: Not on file  Occupational History   Not on file  Tobacco Use   Smoking status: Former    Current packs/day: 0.00    Types:  Cigarettes    Quit date: 08/05/2017    Years since quitting: 6.2   Smokeless tobacco: Never  Vaping Use   Vaping status: Former  Substance and Sexual Activity   Alcohol use: Yes    Comment: rarely   Drug use: No   Sexual activity: Yes    Partners: Male    Birth control/protection: None  Other Topics Concern   Not on file  Social History Narrative   Not on file   Social Drivers of Health   Financial Resource Strain: Not on file  Food Insecurity: No Food Insecurity (08/26/2023)   Hunger Vital Sign    Worried About Running Out of Food in the Last Year: Never true    Ran Out of Food in the Last Year: Never true  Transportation Needs: No Transportation Needs (08/26/2023)   PRAPARE - Administrator, Civil Service (Medical): No    Lack of Transportation (Non-Medical): No  Physical Activity: Not on file  Stress: Not on file  Social Connections: Not on file  Intimate Partner Violence: Not At Risk (08/26/2023)   Humiliation, Afraid, Rape, and Kick questionnaire    Fear of Current or Ex-Partner: No    Emotionally Abused: No    Physically Abused: No  Sexually Abused: No   No current facility-administered medications on file prior to encounter.   Current Outpatient Medications on File Prior to Encounter  Medication Sig Dispense Refill   azelastine  (ASTELIN ) 0.1 % nasal spray Place 2 sprays into both nostrils daily. Use in each nostril as directed 30 mL 12   meclizine  (ANTIVERT ) 12.5 MG tablet Take 1 tablet (12.5 mg total) by mouth 3 (three) times daily as needed for dizziness. 30 tablet 0   Menthol , Topical Analgesic, (BIOFREEZE) 4 % GEL Apply 1 application topically 4 (four) times daily as needed (For back pain.).     metoCLOPramide  (REGLAN ) 10 MG tablet Take 1 tablet (10 mg total) by mouth every 6 (six) hours. 30 tablet 0   metroNIDAZOLE  (FLAGYL ) 500 MG tablet Take 1 tablet (500 mg total) by mouth 2 (two) times daily. 14 tablet 0   ondansetron  (ZOFRAN ) 4 MG tablet Take 2  tablets (8 mg total) by mouth 2 (two) times daily. 20 tablet 0   ondansetron  (ZOFRAN -ODT) 4 MG disintegrating tablet Take 1 tablet (4 mg total) by mouth every 6 (six) hours as needed for nausea. 20 tablet 0   potassium chloride  SA (KLOR-CON  M) 20 MEQ tablet Take 1 tablet (20 mEq total) by mouth 2 (two) times daily for 2 days. 4 tablet 0   Prenatal Vit-Fe Fumarate-FA (PREPLUS) 27-1 MG TABS Take 1 tablet by mouth daily. 30 tablet 13   promethazine  (PHENERGAN ) 12.5 MG tablet Take 1 tablet (12.5 mg total) by mouth every 6 (six) hours as needed for nausea or vomiting. 30 tablet 0   scopolamine  (TRANSDERM-SCOP) 1 MG/3DAYS Place 1 patch (1.5 mg total) onto the skin every 3 (three) days. 10 patch 1   triamcinolone  (NASACORT ) 55 MCG/ACT AERO nasal inhaler Place 2 sprays into the nose daily. 1 each 12   Allergies  Allergen Reactions   Augmentin [Amoxicillin-Pot Clavulanate] Diarrhea and Nausea And Vomiting        Ciprofloxacin Hives   Doxycycline Hives   Hydrocodone Hives   Prednisone     Had a reaction to high doses and was told not to take again   Raspberry Swelling    Throat swelling    ROS:  Pertinent positives/negatives listed above.  I have reviewed patient's Past Medical Hx, Surgical Hx, Family Hx, Social Hx, medications and allergies.   Physical Exam  Patient Vitals for the past 24 hrs:  BP Temp Temp src Pulse Resp Height Weight  11/10/23 2303 121/72 97.8 F (36.6 C) Oral 86 18 5' 5 (1.651 m) 103.9 kg   Constitutional: Well-developed, well-nourished female in no acute distress  Cardiovascular: normal rate Respiratory: normal effort GI: Abd soft, non-tender MS: Extremities nontender, no edema, normal ROM Neurologic: Alert and oriented x 4  GU: Neg CVAT  PELVIC EXAM: Cervix pink, visually closed, without lesion, scant white creamy discharge, vaginal walls and external genitalia normal. Very small clot/dried blood at cervix  FHT:  Baseline 130, moderate variability,  accelerations present, no decelerations Contractions: none  LAB RESULTS Results for orders placed or performed during the hospital encounter of 11/10/23 (from the past 24 hours)  Fetal fibronectin     Status: Abnormal   Collection Time: 11/11/23 12:02 AM  Result Value Ref Range   Fetal Fibronectin POSITIVE (A) NEGATIVE  Wet prep, genital     Status: None   Collection Time: 11/11/23 12:11 AM  Result Value Ref Range   Yeast Wet Prep HPF POC NONE SEEN NONE SEEN   Trich, Wet  Prep NONE SEEN NONE SEEN   Clue Cells Wet Prep HPF POC NONE SEEN NONE SEEN   WBC, Wet Prep HPF POC <10 <10   Sperm NONE SEEN   Urinalysis, Routine w reflex microscopic -Urine, Clean Catch     Status: Abnormal   Collection Time: 11/11/23 12:15 AM  Result Value Ref Range   Color, Urine YELLOW YELLOW   APPearance CLEAR CLEAR   Specific Gravity, Urine 1.024 1.005 - 1.030   pH 6.0 5.0 - 8.0   Glucose, UA NEGATIVE NEGATIVE mg/dL   Hgb urine dipstick SMALL (A) NEGATIVE   Bilirubin Urine NEGATIVE NEGATIVE   Ketones, ur NEGATIVE NEGATIVE mg/dL   Protein, ur 30 (A) NEGATIVE mg/dL   Nitrite NEGATIVE NEGATIVE   Leukocytes,Ua NEGATIVE NEGATIVE   RBC / HPF 0-5 0 - 5 RBC/hpf   WBC, UA 0-5 0 - 5 WBC/hpf   Bacteria, UA NONE SEEN NONE SEEN   Squamous Epithelial / HPF 0-5 0 - 5 /HPF   Mucus PRESENT     --/--/O POS (02/18 1203)  IMAGING No results found.  MAU Management/MDM: Orders Placed This Encounter  Procedures   Wet prep, genital   US  MFM OB LIMITED   Fetal fibronectin   Urinalysis, Routine w reflex microscopic -Urine, Clean Catch   Discharge patient    Meds ordered this encounter  Medications   calcium  carbonate (TUMS - dosed in mg elemental calcium ) chewable tablet 800 mg of elemental calcium    cyclobenzaprine  (FLEXERIL ) tablet 10 mg   cyclobenzaprine  (FLEXERIL ) 10 MG tablet    Sig: Take 1 tablet (10 mg total) by mouth 3 (three) times daily as needed for muscle spasms.    Dispense:  30 tablet    Refill:   0     Available prenatal records reviewed.  Patient presents with vaginal bleeding with resolved placenta previa at [redacted]w[redacted]d pregnant. SSE performed to assess for source of bleeding. Small clot present at cervix. Obtained wet prep, GC/C, UA to assess for causes. Will also obtain US  to assess placenta for abruption and resolution of previa (cannot see imaging from [facility]). Will give flexeril , as cramping is not showing up on toco as contractions and uterus also soft during these cramps.  Wet prep, UA negative. FFN is positive (albeit slight blood on swab). Ultrasound showed normal placenta. Overall do not suspect preterm labor given no contractions on toco or palpation, visually closed cervix. Do suspect false positive FFN given blood. Flexeril  resolved cramping. No further bleeding in MAU. Do not suspect PTL, abruption at this time.  Called attending, Dr. Henry, 1:47 AM without answer. Called again 2 AM and discussed case. Will discharge home and will arrange close follow-up in office. Strict return precautions for increase/continued bleeding, regular contractions.  ASSESSMENT 1. Vaginal bleeding during pregnancy   2. NST (non-stress test) reactive   3. [redacted] weeks gestation of pregnancy     PLAN Discharge home with strict return precautions. Allergies as of 11/11/2023       Reactions   Augmentin [amoxicillin-pot Clavulanate] Diarrhea, Nausea And Vomiting       Ciprofloxacin Hives   Doxycycline Hives   Hydrocodone Hives   Prednisone    Had a reaction to high doses and was told not to take again   Raspberry Swelling   Throat swelling        Medication List     STOP taking these medications    azelastine  0.1 % nasal spray Commonly known as: ASTELIN   meclizine  12.5 MG tablet Commonly known as: ANTIVERT    metoCLOPramide  10 MG tablet Commonly known as: REGLAN    metroNIDAZOLE  500 MG tablet Commonly known as: FLAGYL    ondansetron  4 MG disintegrating tablet Commonly  known as: ZOFRAN -ODT   potassium chloride  SA 20 MEQ tablet Commonly known as: KLOR-CON  M   scopolamine  1 MG/3DAYS Commonly known as: TRANSDERM-SCOP   triamcinolone  55 MCG/ACT Aero nasal inhaler Commonly known as: NASACORT        TAKE these medications    Biofreeze 4 % Gel Generic drug: Menthol  (Topical Analgesic) Apply 1 application topically 4 (four) times daily as needed (For back pain.).   cyclobenzaprine  10 MG tablet Commonly known as: FLEXERIL  Take 1 tablet (10 mg total) by mouth 3 (three) times daily as needed for muscle spasms. What changed: when to take this   ondansetron  4 MG tablet Commonly known as: Zofran  Take 2 tablets (8 mg total) by mouth 2 (two) times daily.   PrePLUS 27-1 MG Tabs Take 1 tablet by mouth daily.   promethazine  12.5 MG tablet Commonly known as: PHENERGAN  Take 1 tablet (12.5 mg total) by mouth every 6 (six) hours as needed for nausea or vomiting.         Almarie Moats, MD OB Fellow 11/11/2023  2:16 AM

## 2023-12-04 ENCOUNTER — Other Ambulatory Visit: Payer: Self-pay

## 2023-12-04 ENCOUNTER — Inpatient Hospital Stay (HOSPITAL_COMMUNITY)
Admission: AD | Admit: 2023-12-04 | Discharge: 2023-12-04 | Disposition: A | Discharge status: Home / Self Care | Payer: PRIVATE HEALTH INSURANCE | Attending: Obstetrics and Gynecology | Admitting: Obstetrics and Gynecology

## 2023-12-04 ENCOUNTER — Encounter (HOSPITAL_COMMUNITY): Payer: Self-pay | Admitting: Family Medicine

## 2023-12-04 DIAGNOSIS — O26893 Other specified pregnancy related conditions, third trimester: Secondary | ICD-10-CM | POA: Diagnosis not present

## 2023-12-04 DIAGNOSIS — K219 Gastro-esophageal reflux disease without esophagitis: Secondary | ICD-10-CM

## 2023-12-04 DIAGNOSIS — Z3A37 37 weeks gestation of pregnancy: Secondary | ICD-10-CM

## 2023-12-04 DIAGNOSIS — O471 False labor at or after 37 completed weeks of gestation: Secondary | ICD-10-CM | POA: Insufficient documentation

## 2023-12-04 DIAGNOSIS — R0789 Other chest pain: Secondary | ICD-10-CM | POA: Insufficient documentation

## 2023-12-04 DIAGNOSIS — O479 False labor, unspecified: Secondary | ICD-10-CM

## 2023-12-04 LAB — URINALYSIS, ROUTINE W REFLEX MICROSCOPIC
Bacteria, UA: NONE SEEN
Bilirubin Urine: NEGATIVE
Glucose, UA: NEGATIVE mg/dL
Hgb urine dipstick: NEGATIVE
Ketones, ur: NEGATIVE mg/dL
Leukocytes,Ua: NEGATIVE
Nitrite: NEGATIVE
Protein, ur: NEGATIVE mg/dL
Specific Gravity, Urine: 1.028 (ref 1.005–1.030)
pH: 5 (ref 5.0–8.0)

## 2023-12-04 MED ORDER — ALUM & MAG HYDROXIDE-SIMETH 200-200-20 MG/5ML PO SUSP
30.0000 mL | Freq: Once | ORAL | Status: AC
Start: 2023-12-04 — End: 2023-12-04
  Administered 2023-12-04: 30 mL via ORAL
  Filled 2023-12-04: qty 30

## 2023-12-04 NOTE — MAU Provider Note (Signed)
 History     [id number]  Arrival date and time: 12/04/23 0059    Chief Complaint  Patient presents with   Contractions     HPI Valerie [name] is a 33 y.o. at [redacted]w[redacted]d who presented for a labor check. She stated that she was also having a funny feeling in her chest so MAU provider was asked to evaluate. She states that it was likely reflux as she has a history and takes famotidine twice daily. She also has a heart history and that is why she was concerned. Per patient report she has a hx of first degree block and palpitations and was seen by a cardiologist. Pain does not radiate and is in epigastric area. Rates pain as mild. States she had lasagna tonight and she thinks that is what set it off.    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   BV (bacterial vaginosis)    Depression    Eczema    First degree heart block    Gastric ulcer    2000   Heart murmur    Infection    UTI, chronic sinus inf   Migraine    Mitral valve regurgitation    Ovarian cyst    Tricuspid valve regurgitation    UTI (urinary tract infection)     Past Surgical History:  Procedure Laterality Date   ADENOIDECTOMY     COLPOSCOPY     CRYOTHERAPY     LEEP     MYRINGOTOMY WITH TUBE PLACEMENT     TUBAL LIGATION N/A 04/01/2018   Procedure: POST PARTUM TUBAL LIGATION;  Surgeon: Kandis Devaughn Sayres, MD;  Location: [facility];  Service: Gynecology;  Laterality: N/A;    Family History  Problem Relation Age of Onset   Hypertension Mother    Diabetes Father    Hypertension Father    Cancer Paternal Aunt        female   Diabetes Paternal Uncle    Diabetes Paternal Grandmother    Hypertension Paternal Grandmother    Diabetes Paternal Grandfather    Hypertension Paternal Grandfather     Social History   Socioeconomic History   Marital status: Legally Separated     Spouse name: Not on file   Number of children: Not on file   Years of education: Not on file   Highest education level: Not on file  Occupational History   Not on file  Tobacco Use   Smoking status: Former    Current packs/day: 0.00    Types: Cigarettes    Quit date: 08/05/2017    Years since quitting: 6.3   Smokeless tobacco: Never  Vaping Use   Vaping status: Former  Substance and Sexual Activity   Alcohol use: Not Currently    Comment: rarely   Drug use: No   Sexual activity: Yes    Partners: Male    Birth control/protection: None  Other Topics Concern   Not on file  Social History Narrative   Not on file   Social Drivers of Health   Financial Resource Strain: Not on file  Food Insecurity: No Food Insecurity (08/26/2023)   Hunger Vital Sign    Worried About Running Out of Food in the Last Year: Never true    Ran Out of Food in the Last  Year: Never true  Transportation Needs: No Transportation Needs (08/26/2023)   PRAPARE - Administrator, Civil Service (Medical): No    Lack of Transportation (Non-Medical): No  Physical Activity: Not on file  Stress: Not on file  Social Connections: Not on file  Intimate Partner Violence: Not At Risk (08/26/2023)   Humiliation, Afraid, Rape, and Kick questionnaire    Fear of Current or Ex-Partner: No    Emotionally Abused: No    Physically Abused: No    Sexually Abused: No    Allergies  Allergen Reactions   Augmentin [Amoxicillin-Pot Clavulanate] Diarrhea and Nausea And Vomiting        Ciprofloxacin Hives   Doxycycline Hives   Hydrocodone Hives   Prednisone     Had a reaction to high doses and was told not to take again   Raspberry Swelling    Throat swelling    No current facility-administered medications on file prior to encounter.   Current Outpatient Medications on File Prior to Encounter  Medication Sig Dispense Refill   cyclobenzaprine  (FLEXERIL ) 10 MG tablet Take 1 tablet (10 mg total) by mouth 3  (three) times daily as needed for muscle spasms. 30 tablet 0   Menthol , Topical Analgesic, (BIOFREEZE) 4 % GEL Apply 1 application topically 4 (four) times daily as needed (For back pain.).     ondansetron  (ZOFRAN ) 4 MG tablet Take 2 tablets (8 mg total) by mouth 2 (two) times daily. 20 tablet 0   Prenatal Vit-Fe Fumarate-FA (PREPLUS) 27-1 MG TABS Take 1 tablet by mouth daily. 30 tablet 13   promethazine  (PHENERGAN ) 12.5 MG tablet Take 1 tablet (12.5 mg total) by mouth every 6 (six) hours as needed for nausea or vomiting. 30 tablet 0    Pertinent positives and negative per HPI, all others reviewed and negative  Physical Exam   BP 123/85 (BP Location: Right Arm)   Pulse 89   Temp 98.2 F (36.8 C) (Oral)   Resp 19   Ht 5' 5 (1.651 m)   Wt 104.4 kg   LMP 03/04/2023   SpO2 100%   BMI 38.31 kg/m   Patient Vitals for the past 24 hrs:  BP Temp Temp src Pulse Resp SpO2 Height Weight  12/04/23 0119 123/85 98.2 F (36.8 C) Oral 89 19 100 % -- --  12/04/23 0114 -- -- -- -- -- -- 5' 5 (1.651 m) 104.4 kg    Physical Exam Vitals and nursing note reviewed.  Constitutional:      Appearance: She is well-developed.  HENT:     Head: Normocephalic and atraumatic.     Mouth/Throat:     Mouth: Mucous membranes are moist.  Eyes:     Extraocular Movements: Extraocular movements intact.  Cardiovascular:     Rate and Rhythm: Normal rate and regular rhythm.  Pulmonary:     Effort: Pulmonary effort is normal.  Abdominal:     Palpations: Abdomen is soft.     Tenderness: There is no abdominal tenderness.  Skin:    Capillary Refill: Capillary refill takes less than 2 seconds.  Neurological:     General: No focal deficit present.     Mental Status: She is alert.      Cervical Exam Dilation: 4 (inner os) Effacement (%): 70 Cervical Position: Middle Station: -3 Presentation: Vertex Exam by:: [employer], RN  FHT Baseline: 120 bpm Variability: Good {> 6 bpm) Accelerations:  Reactive Decelerations: Absent Uterine activity: Every 3-5 minutes  Labs Results  for orders placed or performed during the hospital encounter of 12/04/23 (from the past 24 hours)  Urinalysis, Routine w reflex microscopic -Urine, Clean Catch     Status: Abnormal   Collection Time: 12/04/23  1:34 AM  Result Value Ref Range   Color, Urine YELLOW YELLOW   APPearance HAZY (A) CLEAR   Specific Gravity, Urine 1.028 1.005 - 1.030   pH 5.0 5.0 - 8.0   Glucose, UA NEGATIVE NEGATIVE mg/dL   Hgb urine dipstick NEGATIVE NEGATIVE   Bilirubin Urine NEGATIVE NEGATIVE   Ketones, ur NEGATIVE NEGATIVE mg/dL   Protein, ur NEGATIVE NEGATIVE mg/dL   Nitrite NEGATIVE NEGATIVE   Leukocytes,Ua NEGATIVE NEGATIVE   RBC / HPF 6-10 0 - 5 RBC/hpf   WBC, UA 0-5 0 - 5 WBC/hpf   Bacteria, UA NONE SEEN NONE SEEN   Squamous Epithelial / HPF 0-5 0 - 5 /HPF   Mucus PRESENT    Ca Oxalate Crys, UA PRESENT     Imaging No results found.  MAU Course  Procedures  Lab Orders         Urinalysis, Routine w reflex microscopic -Urine, Clean Catch    Meds ordered this encounter  Medications   alum & mag hydroxide-simeth (MAALOX/MYLANTA) 200-200-20 MG/5ML suspension 30 mL   Imaging Orders  No imaging studies ordered today    MDM Moderate (Level 3-4)  Assessment and Plan  #Chest Pain #[redacted] weeks gestation of pregnancy   #FWB NST: Reactive  Valerie [name] is a 33 y.o. at [redacted]w[redacted]d who presented for a labor check who also complained of chest pain.  -Labor check completed by Dr. Letha -ECG performed which was unchanged from previous -Reviewed cardiology notes and echo from 2021 which were unremarkable. -Given GI cocktail with resolution of symptoms.  -Stable for discharge home -Labor precautions provided -Follow up with [facility] provider as scheduled.      Aanyah Loa L Charish Schroepfer, MD/MHA 12/04/23 3:05 AM  Allergies as of 12/04/2023       Reactions   Augmentin [amoxicillin-pot Clavulanate] Diarrhea, Nausea  And Vomiting       Ciprofloxacin Hives   Doxycycline Hives   Hydrocodone Hives   Prednisone    Had a reaction to high doses and was told not to take again   Raspberry Swelling   Throat swelling        Medication List     TAKE these medications    Biofreeze 4 % Gel Generic drug: Menthol  (Topical Analgesic) Apply 1 application topically 4 (four) times daily as needed (For back pain.).   cyclobenzaprine  10 MG tablet Commonly known as: FLEXERIL  Take 1 tablet (10 mg total) by mouth 3 (three) times daily as needed for muscle spasms.   ondansetron  4 MG tablet Commonly known as: Zofran  Take 2 tablets (8 mg total) by mouth 2 (two) times daily.   PrePLUS 27-1 MG Tabs Take 1 tablet by mouth daily.   promethazine  12.5 MG tablet Commonly known as: PHENERGAN  Take 1 tablet (12.5 mg total) by mouth every 6 (six) hours as needed for nausea or vomiting.

## 2023-12-04 NOTE — MAU Note (Signed)
 Valerie [name] is a 33 y.o. at [redacted]w[redacted]d here in MAU reporting: ctx that have been irregular throughout the day but have been every 2-3 minutes for the past hour. Also having a lot of pressure in vagina. Denies VB or LOF. States baby has been lazy today - got her kick counts, but less FM than usual. States she has a pulling feeling in the middle of her chest that has come and gone throughout the day as well - not a hurt feeling, just a weird feeling  LMP: NA Onset of complaint: 0015 Pain score: 5 - ctx; 8 - pressure Vitals:   12/04/23 0119  BP: 123/85  Pulse: 89  Resp: 19  Temp: 98.2 F (36.8 C)  SpO2: 100%     FHT: 140  Lab orders placed from triage: UA

## 2023-12-05 ENCOUNTER — Inpatient Hospital Stay (HOSPITAL_COMMUNITY)
Admission: AD | Admit: 2023-12-05 | Discharge: 2023-12-05 | Disposition: A | Discharge status: Home / Self Care | Payer: PRIVATE HEALTH INSURANCE | Attending: Obstetrics and Gynecology | Admitting: Obstetrics and Gynecology

## 2023-12-05 ENCOUNTER — Encounter (HOSPITAL_COMMUNITY): Payer: Self-pay | Admitting: Obstetrics and Gynecology

## 2023-12-05 DIAGNOSIS — N898 Other specified noninflammatory disorders of vagina: Secondary | ICD-10-CM | POA: Diagnosis present

## 2023-12-05 DIAGNOSIS — O479 False labor, unspecified: Secondary | ICD-10-CM

## 2023-12-05 DIAGNOSIS — Z0371 Encounter for suspected problem with amniotic cavity and membrane ruled out: Secondary | ICD-10-CM | POA: Diagnosis not present

## 2023-12-05 DIAGNOSIS — Z3A37 37 weeks gestation of pregnancy: Secondary | ICD-10-CM | POA: Insufficient documentation

## 2023-12-05 LAB — POCT FERN TEST: POCT Fern Test: NEGATIVE

## 2023-12-05 NOTE — MAU Note (Addendum)
 Valerie [name] is a 33 y.o. at [redacted]w[redacted]d here in MAU reporting leaking fld at 2200 that was clear and was unsure if was urine or not. Has continued to leak fld off and on and still unsure if is urine or not. Has been contracting all day long but not very strong. May be getting alittle stronger. Contractions are not painful but just uncomfortable pressure  LMP: na Onset of complaint: 2200 Pain score: 5 Vitals:   12/05/23 0013 12/05/23 0016  BP:  124/75  Pulse: 68   Resp: 18   Temp: 97.9 F (36.6 C)   SpO2: 100%      FHT: 132  Lab orders placed from triage: labor eval

## 2023-12-12 ENCOUNTER — Encounter (HOSPITAL_COMMUNITY): Payer: Self-pay | Admitting: Obstetrics and Gynecology

## 2023-12-12 ENCOUNTER — Inpatient Hospital Stay (EMERGENCY_DEPARTMENT_HOSPITAL)
Admission: AD | Admit: 2023-12-12 | Discharge: 2023-12-12 | Disposition: A | Discharge status: Home / Self Care | Payer: PRIVATE HEALTH INSURANCE | Source: Home / Self Care | Attending: Obstetrics and Gynecology | Admitting: Obstetrics and Gynecology

## 2023-12-12 DIAGNOSIS — G43909 Migraine, unspecified, not intractable, without status migrainosus: Secondary | ICD-10-CM | POA: Diagnosis not present

## 2023-12-12 DIAGNOSIS — G43009 Migraine without aura, not intractable, without status migrainosus: Secondary | ICD-10-CM | POA: Insufficient documentation

## 2023-12-12 DIAGNOSIS — O99353 Diseases of the nervous system complicating pregnancy, third trimester: Secondary | ICD-10-CM | POA: Insufficient documentation

## 2023-12-12 DIAGNOSIS — Z3A38 38 weeks gestation of pregnancy: Secondary | ICD-10-CM | POA: Diagnosis not present

## 2023-12-12 DIAGNOSIS — O471 False labor at or after 37 completed weeks of gestation: Secondary | ICD-10-CM | POA: Insufficient documentation

## 2023-12-12 DIAGNOSIS — O9902 Anemia complicating childbirth: Secondary | ICD-10-CM | POA: Diagnosis not present

## 2023-12-12 DIAGNOSIS — O26893 Other specified pregnancy related conditions, third trimester: Secondary | ICD-10-CM

## 2023-12-12 DIAGNOSIS — Z3689 Encounter for other specified antenatal screening: Secondary | ICD-10-CM

## 2023-12-12 LAB — URINALYSIS, ROUTINE W REFLEX MICROSCOPIC
Bilirubin Urine: NEGATIVE
Glucose, UA: NEGATIVE mg/dL
Ketones, ur: NEGATIVE mg/dL
Nitrite: NEGATIVE
Protein, ur: NEGATIVE mg/dL
Specific Gravity, Urine: 1.021 (ref 1.005–1.030)
pH: 5 (ref 5.0–8.0)

## 2023-12-12 MED ORDER — SODIUM CHLORIDE 0.9 % IV SOLN
25.0000 mg | Freq: Once | INTRAVENOUS | Status: AC
  Administered 2023-12-12: 25 mg via INTRAVENOUS
  Filled 2023-12-12: qty 1

## 2023-12-12 MED ORDER — ACETAMINOPHEN-CAFFEINE 500-65 MG PO TABS
2.0000 | ORAL_TABLET | Freq: Once | ORAL | Status: AC
  Administered 2023-12-12: 2 via ORAL
  Filled 2023-12-12: qty 2

## 2023-12-12 MED ORDER — METOCLOPRAMIDE HCL 5 MG/ML IJ SOLN
10.0000 mg | Freq: Once | INTRAMUSCULAR | Status: AC
  Administered 2023-12-12: 10 mg via INTRAVENOUS
  Filled 2023-12-12: qty 2

## 2023-12-12 MED ORDER — DIPHENHYDRAMINE HCL 50 MG/ML IJ SOLN
12.5000 mg | Freq: Once | INTRAMUSCULAR | Status: AC
  Administered 2023-12-12: 12.5 mg via INTRAVENOUS
  Filled 2023-12-12: qty 1

## 2023-12-12 NOTE — Discharge Instructions (Signed)
 Thank you for coming to the Maternity Assessment Unit (MAU) at [facility].  You came in on 12/12/23 for severe vaginal and pelvic pressure following yesterday's membrane sweep. You also had a migraine with nausea and vomiting.  We treated you with Excedrin (Tylenol  plus caffeine ), which did not improve your symptoms significantly. We followed this with Benadryl , Reglan , Phenergan , and fluids through IV, which improved your migraine XXX  Please return to the MAU (Maternity Assessment Unit) if you experience vaginal bleeding,leaking/gush of fluid like your water broke, notice decreased movement from your baby after doing kick counts, or contractions the are becoming more intense or more frequent.   Please see information below about fetal movement counts, signs/symptoms of labor, and when to return to the MAU for care.  Fetal Movement Counts When you're pregnant, you might start feeling your baby move around the middle of your pregnancy. At first, these movements might feel like flutters, rolls, or swishes. As your baby grows, you might feel more kicks and jabs. Around week 28 of your pregnancy, your health care team may ask you to count how often your baby moves. This is important for all pregnancies, but especially for high-risk ones. Counting movements can help lessen the risk of stillbirth.  What is a fetal movement count? A fetal movement count is the number of times that you feel your baby move during a certain amount of time. This may also be called a kick count. There are many ways to do a kick count. Ask your team what is best for you. Pay attention to when your baby is most active. You may notice your baby's sleep and wake cycles. You may also notice things that make your baby move more. When you do a kick count, try to do it: When your baby is normally most active. At the same time each day.  How do I count fetal movements? Find a quiet, comfortable area. Sit or lie down. Write  down the date, the start time, and the number of movements you feel. Count kicks, flutters, swishes, rolls, and jabs. Usually, you will feel at least 10 movements within 2 hours. Stop counting after you have felt 10 movements or if you have been counting for 2 hours. Write down the stop time.  Contact a health care provider if:  You don't feel 10 movements in 2 hours. Your baby isn't moving as it usually does. Your baby isn't moving at all. If you're not able to reach your provider, go to an emergency room. This information is not intended to replace advice given to you by your health care provider. Make sure you discuss any questions you have with your health care provider.  Document Revised: 03/28/2023 Document Reviewed: 03/20/2022 Elsevier Patient Education  2025 [employer].  Signs and Symptoms of Labor Labor is the body's natural process of moving the baby and the placenta out of the uterus. The process of labor usually starts when the baby is full-term, 37 weeks and 0 days or more.   As your body prepares for labor and the birth of your baby, you may notice the following symptoms in the weeks and days before true labor starts: Your baby dropping lower into your pelvis to get into position for birth (lightening). When this happens, you may feel more pressure on your bladder and pelvic bone and less pressure on your ribs. This may make it easier to breathe. It may also cause you to need to urinate more often and  have problems with bowel movements. Having practice contractions, also called Braxton Hicks contractions or false labor. These occur at irregular (unevenly spaced) intervals that are more than 10 minutes apart. False labor contractions are common after exercise or sexual activity. They will stop if you change position, rest, or drink fluids. These contractions are usually mild and do not get stronger over time. They may feel like: A backache or back pain. Mild cramps, similar to  menstrual cramps. Tightening or pressure in your abdomen. Passing a small amount of thick, bloody mucus from your vagina. This is called normal bloody show or losing your mucus plug. This may happen more than a week before labor begins, or right before labor begins, as the opening of the cervix starts to widen (dilate). For some women, the entire mucus plug passes at once. For others, pieces of the mucus plug may gradually pass over several days.  Signs and symptoms that labor has begun Signs that you are in labor may include: Having contractions that come at regular (evenly spaced) intervals and increase in intensity. This may feel like more intense tightening or pressure in your abdomen that moves to your back. Feeling pressure in the vaginal area. Your water breaking (called rupture of membranes). This is when the sac of fluid that surrounds your baby breaks. Fluid leaking from your vagina may be clear or blood-tinged. Labor usually starts within 24 hours of your water breaking, but it may take longer to begin. Some people may feel a sudden gush of fluid; others may notice repeatedly damp underwear.  Go to the hospital when  Your water breaks. Your labor has started: painful, regular contractions that are 5 minutes apart or less. You have a fever. You have bright red blood coming from your vagina. You do not feel your baby moving. You have a severe headache with or without vision problems. You have chest pain or shortness of breath. These symptoms may represent a serious problem that is an emergency. Do not wait to see if the symptoms will go away. Get medical help right away. Call your local emergency services (911 in the [place] Do not drive yourself to the hospital.  Summary Labor is your body's natural process of moving your baby and the placenta out of your uterus. The process of labor usually starts when your baby is full-term When labor starts, or if your water breaks, call your  health care provider or nurse care line. Based on your situation, they will determine when you should go in for an exam. This information is not intended to replace advice given to you by your health care provider. Make sure you discuss any questions you have with your health care provider.  Document Revised: 07/18/2020 Document Reviewed: 07/18/2020-- adapted Elsevier Patient Education  2024 [employer].

## 2023-12-12 NOTE — MAU Note (Signed)
 Valerie [name] is a 33 y.o. at [redacted]w[redacted]d here in MAU reporting: increased pelvic pressure and ctx. Membranes stripped yesterday 5.5cm. blood yshow no leaking. Good fetal movement.  C/o headache  LMP:  Onset of complaint: last night Pain score: 10 Vitals:   12/12/23 0819  BP: 133/77  Pulse: 90  Resp: 18  Temp: 98.1 F (36.7 C)     FHT: 159  Lab orders placed from triage: labor eval

## 2023-12-12 NOTE — MAU Provider Note (Addendum)
 History Valerie [name] is a 33 y.o. female [phone] at [redacted]w[redacted]d who presents with vaginal/pelvic pressure and headache.  HPI Patient reports that since her sweep of membranes yesterday at her OB's office, she has had intense vaginal and pelvic pressure with unbearable contractions, which worsened last night to 4-5 every hour. She reports bloody mucus when wiping and some blood in her underwear, more than the vaginal spotting she expected to see.  She also reports migraine headache with nausea and vomiting 7-8 times yesterday and once this morning without alleviation with Zofran  and Phenergan .  She reports a history of migraines since adolescence that typically resolved with Toradol  and Phenergan  pre-pregnancy. Tylenol  and home and Tylenol  + caffeine  here have reduced HA severity from 7/10 to 6/10.   On focused ROS, patient reports +FM and +CTX every 4-5 min. She reports chronic constipation throughout pregnancy that has recently resolved to softer, but not diarrhea stools. She denies fever, chills, and urinary symptoms.  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   BV (bacterial vaginosis)    Depression    Eczema    First degree heart block    Gastric ulcer    2000   Heart murmur    Infection    UTI, chronic sinus inf   Migraine    Mitral valve regurgitation    Ovarian cyst    Tricuspid valve regurgitation    UTI (urinary tract infection)    Past Surgical History:  Procedure Laterality Date   ADENOIDECTOMY     COLPOSCOPY     CRYOTHERAPY     LEEP     MYRINGOTOMY WITH TUBE PLACEMENT     TUBAL LIGATION N/A 04/01/2018   Procedure: POST PARTUM TUBAL LIGATION;  Surgeon: Kandis Devaughn Sayres, MD;  Location: [facility];  Service: Gynecology;  Laterality: N/A;   Family History  Problem Relation Age of Onset   Hypertension Mother    Diabetes Father    Hypertension Father    Cancer  Paternal Aunt        female   Diabetes Paternal Uncle    Diabetes Paternal Grandmother    Hypertension Paternal Grandmother    Diabetes Paternal Grandfather    Hypertension Paternal Grandfather    Social History   Tobacco Use   Smoking status: Former    Current packs/day: 0.00    Types: Cigarettes    Quit date: 08/05/2017    Years since quitting: 6.3   Smokeless tobacco: Never  Vaping Use   Vaping status: Former  Substance Use Topics   Alcohol use: Not Currently    Comment: rarely   Drug use: No   Allergies:  Allergies  Allergen Reactions   Augmentin [Amoxicillin-Pot Clavulanate] Diarrhea and Nausea And Vomiting        Ciprofloxacin Hives   Doxycycline Hives   Hydrocodone Hives   Prednisone     Had a reaction to high doses and was told not to take again   Raspberry Swelling    Throat swelling   Medications Prior to Admission  Medication Sig Dispense Refill Last Dose/Taking   cyclobenzaprine  (FLEXERIL ) 10 MG tablet Take 1 tablet (10 mg total) by mouth 3 (three) times daily as needed for muscle spasms. 30 tablet 0    Menthol , Topical Analgesic, (BIOFREEZE) 4 % GEL Apply 1 application topically 4 (four)  times daily as needed (For back pain.).      ondansetron  (ZOFRAN ) 4 MG tablet Take 2 tablets (8 mg total) by mouth 2 (two) times daily. 20 tablet 0    Prenatal Vit-Fe Fumarate-FA (PREPLUS) 27-1 MG TABS Take 1 tablet by mouth daily. 30 tablet 13    promethazine  (PHENERGAN ) 12.5 MG tablet Take 1 tablet (12.5 mg total) by mouth every 6 (six) hours as needed for nausea or vomiting. 30 tablet 0    Review of Systems negative aside from what is listed in HPI.  Physical Exam Blood pressure 126/78, pulse 95, temperature 98.1 F (36.7 C), resp. rate 18, height 5' 5 (1.651 m), weight 101.6 kg, last menstrual period 03/04/2023, currently breastfeeding. Physical Exam Constitutional:      General: She is not in acute distress.    Appearance: She is overweight. She is not  toxic-appearing.     Comments: Uncomfortable-appearing, curled up on right side.  Cardiovascular:     Rate and Rhythm: Normal rate.  Pulmonary:     Effort: Pulmonary effort is normal. No respiratory distress.  Neurological:     Mental Status: She is alert. Mental status is at baseline.  Psychiatric:        Mood and Affect: Mood normal.        Behavior: Behavior normal.    Cervix: Dilation: 4.5 Effacement (%): 70 Cervical Position: Middle Station: -3 Presentation: Vertex Exam by:: K.Wilson,RN   FHT - Baseline: 155 - Variability: moderate, marked - Accelerations: reactive - Decelerations: none - Contractions: none on monitor - Notes: patient reports 4-5 ctx every hour    MAU Course Labs - UA hazy, Hgb moderate, leuks moderate, bacteria rare - fern   Meds - Excedrin Tension 500-65 2 tablets x 1 - Benadryl  12.5 mg IV x 1 - Reglan  10 mg IV x 1 - Phenergan  25 mg x 1 - NS bolus 1000 mg x 1  Headache not relieved with Excedrin-Tension but much better with HA cocktail and fluids. Nausea also resolved.   Assessment and Plan Valerie [name] is a 33 y.o. female [phone] at [redacted]w[redacted]d who presents with vaginal/pelvic pressure and migraine.  1. Labor evaluation Consistent with Braxton-Hicks given intermittent contractions without cervical change. FHT reassuring. - Counseling provided  2. Migraine, resolved  3. Desire for permanent contraception Patient reports Hx BTL Filshie clips in January 2020. She desires permanent contraception with repeat salpingectomy. Her current OB office is discontinuing OB services. - Will provide information about interval tubal  Patient is discharged home in stable condition. All questions answered and anticipatory guidance and return precautions provided.  Jayson Ness, MS3 12/12/23 11:20 AM   Attestation of Supervision of Student:  I confirm that I have verified the information documented in the medical student's note and that I have also  personally supervised the history, physical exam and all medical decision making activities.  I have verified that all services and findings are accurately documented in this student's note; and I agree with management and plan as outlined in the documentation. I have also made any necessary editorial changes.  Cornell JONELLE Finder, CNM [facility] [employer], [facility] 12/15/2023 10:35 AM

## 2023-12-13 ENCOUNTER — Inpatient Hospital Stay (HOSPITAL_COMMUNITY)
Admission: AD | Admit: 2023-12-13 | Discharge: 2023-12-16 | DRG: 807 | Disposition: A | Discharge status: Home / Self Care | Payer: PRIVATE HEALTH INSURANCE | Attending: Family Medicine | Admitting: Family Medicine

## 2023-12-13 DIAGNOSIS — Z833 Family history of diabetes mellitus: Secondary | ICD-10-CM

## 2023-12-13 DIAGNOSIS — Z3A38 38 weeks gestation of pregnancy: Secondary | ICD-10-CM

## 2023-12-13 DIAGNOSIS — Z87891 Personal history of nicotine dependence: Secondary | ICD-10-CM

## 2023-12-13 DIAGNOSIS — O99214 Obesity complicating childbirth: Secondary | ICD-10-CM | POA: Diagnosis present

## 2023-12-13 DIAGNOSIS — O9902 Anemia complicating childbirth: Principal | ICD-10-CM | POA: Diagnosis present

## 2023-12-13 DIAGNOSIS — D509 Iron deficiency anemia, unspecified: Secondary | ICD-10-CM | POA: Diagnosis present

## 2023-12-13 DIAGNOSIS — O9952 Diseases of the respiratory system complicating childbirth: Secondary | ICD-10-CM | POA: Diagnosis present

## 2023-12-13 DIAGNOSIS — Z8249 Family history of ischemic heart disease and other diseases of the circulatory system: Secondary | ICD-10-CM

## 2023-12-13 DIAGNOSIS — J45909 Unspecified asthma, uncomplicated: Secondary | ICD-10-CM | POA: Diagnosis present

## 2023-12-14 ENCOUNTER — Inpatient Hospital Stay (HOSPITAL_COMMUNITY): Payer: PRIVATE HEALTH INSURANCE | Admitting: Anesthesiology

## 2023-12-14 ENCOUNTER — Other Ambulatory Visit: Payer: Self-pay

## 2023-12-14 ENCOUNTER — Encounter (HOSPITAL_COMMUNITY): Payer: Self-pay | Admitting: Obstetrics and Gynecology

## 2023-12-14 DIAGNOSIS — O99214 Obesity complicating childbirth: Secondary | ICD-10-CM | POA: Diagnosis present

## 2023-12-14 DIAGNOSIS — D509 Iron deficiency anemia, unspecified: Secondary | ICD-10-CM | POA: Diagnosis present

## 2023-12-14 DIAGNOSIS — J45909 Unspecified asthma, uncomplicated: Secondary | ICD-10-CM | POA: Diagnosis present

## 2023-12-14 DIAGNOSIS — Z8249 Family history of ischemic heart disease and other diseases of the circulatory system: Secondary | ICD-10-CM | POA: Diagnosis not present

## 2023-12-14 DIAGNOSIS — Z833 Family history of diabetes mellitus: Secondary | ICD-10-CM | POA: Diagnosis not present

## 2023-12-14 DIAGNOSIS — Z87891 Personal history of nicotine dependence: Secondary | ICD-10-CM | POA: Diagnosis not present

## 2023-12-14 DIAGNOSIS — O9952 Diseases of the respiratory system complicating childbirth: Secondary | ICD-10-CM | POA: Diagnosis present

## 2023-12-14 DIAGNOSIS — O9902 Anemia complicating childbirth: Secondary | ICD-10-CM | POA: Diagnosis present

## 2023-12-14 DIAGNOSIS — O26893 Other specified pregnancy related conditions, third trimester: Secondary | ICD-10-CM | POA: Diagnosis present

## 2023-12-14 DIAGNOSIS — Z3A38 38 weeks gestation of pregnancy: Secondary | ICD-10-CM | POA: Diagnosis not present

## 2023-12-14 LAB — RPR: RPR Ser Ql: NONREACTIVE

## 2023-12-14 LAB — CBC
HCT: 32.4 % — ABNORMAL LOW (ref 36.0–46.0)
Hemoglobin: 10.5 g/dL — ABNORMAL LOW (ref 12.0–15.0)
MCH: 32.1 pg (ref 26.0–34.0)
MCHC: 32.4 g/dL (ref 30.0–36.0)
MCV: 99.1 fL (ref 80.0–100.0)
Platelets: 247 K/uL (ref 150–400)
RBC: 3.27 MIL/uL — ABNORMAL LOW (ref 3.87–5.11)
RDW: 14.4 % (ref 11.5–15.5)
WBC: 9.1 K/uL (ref 4.0–10.5)
nRBC: 0 % (ref 0.0–0.2)

## 2023-12-14 LAB — TYPE AND SCREEN
ABO/RH(D): O POS
Antibody Screen: NEGATIVE

## 2023-12-14 MED ORDER — DIBUCAINE (PERIANAL) 1 % EX OINT
1.0000 | TOPICAL_OINTMENT | CUTANEOUS | Status: DC | PRN
  Administered 2023-12-15: 1 via RECTAL
  Filled 2023-12-14: qty 28

## 2023-12-14 MED ORDER — LACTATED RINGERS IV SOLN: INTRAVENOUS | Status: DC

## 2023-12-14 MED ORDER — EPHEDRINE 5 MG/ML INJ: 10.0000 mg | INTRAVENOUS | Status: DC | PRN

## 2023-12-14 MED ORDER — TETANUS-DIPHTH-ACELL PERTUSSIS 5-2.5-18.5 LF-MCG/0.5 IM SUSY: 0.5000 mL | PREFILLED_SYRINGE | Freq: Once | INTRAMUSCULAR | Status: DC

## 2023-12-14 MED ORDER — WITCH HAZEL-GLYCERIN EX PADS
1.0000 | MEDICATED_PAD | CUTANEOUS | Status: DC | PRN
  Administered 2023-12-14 – 2023-12-15 (×2): 1 via TOPICAL

## 2023-12-14 MED ORDER — DIPHENHYDRAMINE HCL 25 MG PO CAPS: 25.0000 mg | ORAL_CAPSULE | Freq: Four times a day (QID) | ORAL | Status: DC | PRN

## 2023-12-14 MED ORDER — PRENATAL MULTIVITAMIN CH
1.0000 | ORAL_TABLET | Freq: Every day | ORAL | Status: DC
Start: 2023-12-14 — End: 2023-12-16
  Administered 2023-12-14 – 2023-12-15 (×2): 1 via ORAL
  Filled 2023-12-14 (×2): qty 1

## 2023-12-14 MED ORDER — LIDOCAINE HCL (PF) 1 % IJ SOLN: 30.0000 mL | INTRAMUSCULAR | Status: DC | PRN

## 2023-12-14 MED ORDER — COCONUT OIL OIL
1.0000 | TOPICAL_OIL | Status: DC | PRN
  Administered 2023-12-15: 1 via TOPICAL

## 2023-12-14 MED ORDER — DIPHENHYDRAMINE HCL 50 MG/ML IJ SOLN: 12.5000 mg | INTRAMUSCULAR | Status: DC | PRN

## 2023-12-14 MED ORDER — PHENYLEPHRINE 80 MCG/ML (10ML) SYRINGE FOR IV PUSH (FOR BLOOD PRESSURE SUPPORT): 80.0000 ug | PREFILLED_SYRINGE | INTRAVENOUS | Status: DC | PRN

## 2023-12-14 MED ORDER — FLEET ENEMA RE ENEM: 1.0000 | ENEMA | RECTAL | Status: DC | PRN

## 2023-12-14 MED ORDER — SENNOSIDES-DOCUSATE SODIUM 8.6-50 MG PO TABS
2.0000 | ORAL_TABLET | Freq: Every day | ORAL | Status: DC
  Administered 2023-12-15: 2 via ORAL
  Filled 2023-12-14: qty 2

## 2023-12-14 MED ORDER — SOD CITRATE-CITRIC ACID 500-334 MG/5ML PO SOLN
30.0000 mL | ORAL | Status: DC | PRN
  Administered 2023-12-14: 30 mL via ORAL
  Filled 2023-12-14: qty 30

## 2023-12-14 MED ORDER — IBUPROFEN 600 MG PO TABS
600.0000 mg | ORAL_TABLET | Freq: Four times a day (QID) | ORAL | Status: DC
  Administered 2023-12-14 – 2023-12-16 (×9): 600 mg via ORAL
  Filled 2023-12-14 (×9): qty 1

## 2023-12-14 MED ORDER — ONDANSETRON HCL 4 MG/2ML IJ SOLN: 4.0000 mg | INTRAMUSCULAR | Status: DC | PRN

## 2023-12-14 MED ORDER — ACETAMINOPHEN 325 MG PO TABS: 650.0000 mg | ORAL_TABLET | ORAL | Status: DC | PRN

## 2023-12-14 MED ORDER — LACTATED RINGERS IV SOLN: 500.0000 mL | Freq: Once | INTRAVENOUS | Status: DC

## 2023-12-14 MED ORDER — ONDANSETRON HCL 4 MG PO TABS: 4.0000 mg | ORAL_TABLET | ORAL | Status: DC | PRN

## 2023-12-14 MED ORDER — OXYTOCIN-SODIUM CHLORIDE 30-0.9 UT/500ML-% IV SOLN
2.5000 [IU]/h | INTRAVENOUS | Status: DC
Start: 2023-12-14 — End: 2023-12-14
  Filled 2023-12-14: qty 500

## 2023-12-14 MED ORDER — FENTANYL-BUPIVACAINE-NACL 0.5-0.125-0.9 MG/250ML-% EP SOLN
EPIDURAL | Status: AC
  Filled 2023-12-14: qty 250

## 2023-12-14 MED ORDER — OXYTOCIN BOLUS FROM INFUSION
333.0000 mL | Freq: Once | INTRAVENOUS | Status: AC
  Administered 2023-12-14: 333 mL via INTRAVENOUS

## 2023-12-14 MED ORDER — OXYCODONE-ACETAMINOPHEN 5-325 MG PO TABS: 2.0000 | ORAL_TABLET | ORAL | Status: DC | PRN

## 2023-12-14 MED ORDER — FAMOTIDINE 20 MG PO TABS
20.0000 mg | ORAL_TABLET | Freq: Two times a day (BID) | ORAL | Status: DC
  Administered 2023-12-14 – 2023-12-16 (×5): 20 mg via ORAL
  Filled 2023-12-14 (×5): qty 1

## 2023-12-14 MED ORDER — BENZOCAINE-MENTHOL 20-0.5 % EX AERO
1.0000 | INHALATION_SPRAY | CUTANEOUS | Status: DC | PRN
  Administered 2023-12-14 – 2023-12-15 (×2): 1 via TOPICAL
  Filled 2023-12-14 (×2): qty 56

## 2023-12-14 MED ORDER — ACETAMINOPHEN 325 MG PO TABS
650.0000 mg | ORAL_TABLET | ORAL | Status: DC | PRN
  Administered 2023-12-14 – 2023-12-15 (×6): 650 mg via ORAL
  Filled 2023-12-14 (×6): qty 2

## 2023-12-14 MED ORDER — BUPIVACAINE HCL (PF) 0.25 % IJ SOLN
INTRAMUSCULAR | Status: DC | PRN
  Administered 2023-12-14: 1.5 mL via INTRATHECAL

## 2023-12-14 MED ORDER — OXYCODONE-ACETAMINOPHEN 5-325 MG PO TABS: 1.0000 | ORAL_TABLET | ORAL | Status: DC | PRN

## 2023-12-14 MED ORDER — PHENYLEPHRINE 80 MCG/ML (10ML) SYRINGE FOR IV PUSH (FOR BLOOD PRESSURE SUPPORT)
PREFILLED_SYRINGE | INTRAVENOUS | Status: AC
  Filled 2023-12-14: qty 10

## 2023-12-14 MED ORDER — LACTATED RINGERS IV SOLN: 500.0000 mL | INTRAVENOUS | Status: DC | PRN

## 2023-12-14 MED ORDER — FENTANYL-BUPIVACAINE-NACL 0.5-0.125-0.9 MG/250ML-% EP SOLN
12.0000 mL/h | EPIDURAL | Status: DC | PRN
  Administered 2023-12-14: 12 mL/h via EPIDURAL

## 2023-12-14 MED ORDER — ZOLPIDEM TARTRATE 5 MG PO TABS: 5.0000 mg | ORAL_TABLET | Freq: Every evening | ORAL | Status: DC | PRN

## 2023-12-14 MED ORDER — SIMETHICONE 80 MG PO CHEW: 80.0000 mg | CHEWABLE_TABLET | ORAL | Status: DC | PRN

## 2023-12-14 MED ORDER — ONDANSETRON HCL 4 MG/2ML IJ SOLN
4.0000 mg | Freq: Four times a day (QID) | INTRAMUSCULAR | Status: DC | PRN
  Administered 2023-12-14: 4 mg via INTRAVENOUS
  Filled 2023-12-14: qty 2

## 2023-12-14 NOTE — Anesthesia Preprocedure Evaluation (Signed)
 Anesthesia Evaluation  Patient identified by MRN, date of birth, ID band Patient awake    Reviewed: Allergy & Precautions, Patient's Chart, lab work & pertinent test results  Airway Mallampati: II  TM Distance: >3 FB Neck ROM: Full    Dental  (+) Teeth Intact, Dental Advisory Given   Pulmonary neg pulmonary ROS, former smoker   Pulmonary exam normal breath sounds clear to auscultation       Cardiovascular Normal cardiovascular exam+ dysrhythmias (1st deg AVB prior pregnancy) + Valvular Problems/Murmurs (mild MR) MR  Rhythm:Regular Rate:Normal  Echo 2019 Left ventricle: The cavity size was normal. Systolic function was    normal. The estimated ejection fraction was in the range of 55%    to 60%. Wall motion was normal; there were no regional wall    motion abnormalities. Left ventricular diastolic function    parameters were normal.  - Mitral valve: There was mild regurgitation.     Neuro/Psych  Headaches PSYCHIATRIC DISORDERS Anxiety Depression       GI/Hepatic Neg liver ROS, PUD,,,  Endo/Other  BMI 38  Renal/GU negative Renal ROS  negative genitourinary   Musculoskeletal negative musculoskeletal ROS (+)    Abdominal  (+) + obese  Peds negative pediatric ROS (+)  Hematology  (+) Blood dyscrasia, anemia Hb 10.5, plt 247   Anesthesia Other Findings   Reproductive/Obstetrics (+) Pregnancy                              Anesthesia Physical Anesthesia Plan  ASA: 2  Anesthesia Plan: Combined Spinal and Epidural   Post-op Pain Management:    Induction:   PONV Risk Score and Plan: 2  Airway Management Planned: Natural Airway  Additional Equipment: None  Intra-op Plan:   Post-operative Plan:   Informed Consent: I have reviewed the patients History and Physical, chart, labs and discussed the procedure including the risks, benefits and alternatives for the proposed anesthesia with  the patient or authorized representative who has indicated his/her understanding and acceptance.       Plan Discussed with:   Anesthesia Plan Comments: (8cm upon epidural request, will place CSE)        Anesthesia Quick Evaluation

## 2023-12-14 NOTE — H&P (Signed)
 MELESA [name] is a 33 y.o. female, [id number], IUP at 38.3 weeks, presenting for active labor. Pt endorse + Fm. Denies vaginal leakage. Denies vaginal bleeding. Denies feeling cxt's. See Dr Adolph Marcus with [facility], all prenatal labs unremarkable, LR, gb-. H/O asthma but controlled with albuterol as needed, obesity, AMA. 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/2018   Hereditary disease in family possibly affecting fetus, antepartum 11/10/2017   Palpitations 10/16/2017   Cardiac murmur 10/16/2017   First degree AV block 10/16/2017   Unwanted fertility 10/06/2017   Skin tag of perianal region 12/14/2013   Depressive disorder, not elsewhere classified 12/14/2013   Former smoker, stopped smoking in distant past 11/27/2012   Family history of diabetes in pregnancy 11/27/2012   History of facial weakness/Neuro workup/Lesion on MRI w/out change 11/27/2012     Active Ambulatory Problems    Diagnosis Date Noted   Former smoker, stopped smoking in distant past 11/27/2012   Family history of diabetes in pregnancy 11/27/2012   History of facial weakness/Neuro workup/Lesion on MRI w/out change 11/27/2012   Skin tag of perianal region 12/14/2013   Depressive disorder, not elsewhere classified 12/14/2013   Unwanted fertility 10/06/2017   Palpitations 10/16/2017   Cardiac murmur 10/16/2017   First degree AV block 10/16/2017   Hereditary disease in family possibly affecting fetus, antepartum 11/10/2017   Postpartum care following vaginal delivery 04/29/2018   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/2018   Resolved Ambulatory Problems    Diagnosis Date Noted   Supervision of normal first pregnancy 11/27/2012   Constipation 11/27/2012   Nausea 11/27/2012   BV (bacterial vaginosis) 12/24/2012   Backache 07/05/2013   Active labor 07/06/2013    Normal delivery 07/07/2013   Supervision of high risk pregnancy, antepartum, second trimester 10/06/2017   Heart block 10/06/2017   Fetal abnormality affecting management of mother, antepartum 11/10/2017   Pregnancy 04/01/2018   SVD (spontaneous vaginal delivery) 04/01/2018   Past Medical History:  Diagnosis Date   Abnormal Pap smear    Anemia    Anxiety    Bell's palsy    Depression    Eczema    First degree heart block    Gastric ulcer    Heart murmur    Infection    Migraine    Mitral valve regurgitation    Ovarian cyst    Tricuspid valve regurgitation    UTI (urinary tract infection)       Medications Prior to Admission  Medication Sig Dispense Refill Last Dose/Taking   cyclobenzaprine  (FLEXERIL ) 10 MG tablet Take 1 tablet (10 mg total) by mouth 3 (three) times daily as needed for muscle spasms. 30 tablet 0 Past Month   ondansetron  (ZOFRAN ) 4 MG tablet Take 2 tablets (8 mg total) by mouth 2 (two) times daily. 20 tablet 0 12/14/2023 Morning   Prenatal Vit-Fe Fumarate-FA (PREPLUS) 27-1 MG TABS Take 1 tablet by mouth daily. 30 tablet 13 12/14/2023 Morning   promethazine  (PHENERGAN ) 12.5 MG tablet Take 1 tablet (12.5 mg total) by mouth every 6 (six) hours as needed for nausea or vomiting. 30 tablet 0 12/13/2023   Menthol , Topical Analgesic, (BIOFREEZE) 4 % GEL Apply 1 application topically 4 (four) times daily as needed (For back pain.).       Past Medical History:  Diagnosis Date   Abnormal Pap smear  Anemia    Anxiety    Bell's palsy    as a teen; lost some hearing left ear   BV (bacterial vaginosis)    Depression    Eczema    First degree heart block    Gastric ulcer    2000   Heart murmur    Infection    UTI, chronic sinus inf   Migraine    Mitral valve regurgitation    Ovarian cyst    Tricuspid valve regurgitation    UTI (urinary tract infection)      No current facility-administered medications on file prior to encounter.   Current Outpatient  Medications on File Prior to Encounter  Medication Sig Dispense Refill   cyclobenzaprine  (FLEXERIL ) 10 MG tablet Take 1 tablet (10 mg total) by mouth 3 (three) times daily as needed for muscle spasms. 30 tablet 0   ondansetron  (ZOFRAN ) 4 MG tablet Take 2 tablets (8 mg total) by mouth 2 (two) times daily. 20 tablet 0   Prenatal Vit-Fe Fumarate-FA (PREPLUS) 27-1 MG TABS Take 1 tablet by mouth daily. 30 tablet 13   promethazine  (PHENERGAN ) 12.5 MG tablet Take 1 tablet (12.5 mg total) by mouth every 6 (six) hours as needed for nausea or vomiting. 30 tablet 0   Menthol , Topical Analgesic, (BIOFREEZE) 4 % GEL Apply 1 application topically 4 (four) times daily as needed (For back pain.).       Allergies  Allergen Reactions   Augmentin [Amoxicillin-Pot Clavulanate] Diarrhea and Nausea And Vomiting        Ciprofloxacin Hives   Doxycycline Hives   Hydrocodone Hives   Prednisone     Had a reaction to high doses and was told not to take again   Raspberry Swelling    Throat swelling    OB History     Gravida  5   Para  2   Term  2   Preterm      AB  2   Living  2      SAB  2   IAB      Ectopic      Multiple  0   Live Births  2          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   BV (bacterial vaginosis)    Depression    Eczema    First degree heart block    Gastric ulcer    2000   Heart murmur    Infection    UTI, chronic sinus inf   Migraine    Mitral valve regurgitation    Ovarian cyst    Tricuspid valve regurgitation    UTI (urinary tract infection)    Past Surgical History:  Procedure Laterality Date   ADENOIDECTOMY     COLPOSCOPY     CRYOTHERAPY     LEEP     MYRINGOTOMY WITH TUBE PLACEMENT     TUBAL LIGATION N/A 04/01/2018   Procedure: POST PARTUM TUBAL LIGATION;  Surgeon: Kandis Devaughn Sayres, MD;  Location: [facility];  Service: Gynecology;   Laterality: N/A;   Family History: family history includes Cancer in her paternal aunt; Diabetes in her father, paternal grandfather, paternal grandmother, and paternal uncle; Hypertension in her father, mother, paternal grandfather, and paternal grandmother. Social History:  reports that she quit smoking about  6 years ago. Her smoking use included cigarettes. She has never used smokeless tobacco. She reports that she does not currently use alcohol. She reports that she does not use drugs.   Prenatal Transfer Tool  Maternal Diabetes: No Genetic Screening: Normal Maternal Ultrasounds/Referrals: Normal Fetal Ultrasounds or other Referrals:  None Maternal Substance Abuse:  No Significant Maternal Medications:  None Significant Maternal Lab Results: Group B Strep negative  ROS:  Review of Systems  Constitutional: Negative.   HENT: Negative.    Eyes: Negative.   Respiratory: Negative.    Cardiovascular: Negative.   Gastrointestinal:  Positive for abdominal pain.  Genitourinary: Negative.   Musculoskeletal: Negative.   Skin: Negative.   Neurological: Negative.   Endo/Heme/Allergies: Negative.   Psychiatric/Behavioral: Negative.       Physical Exam: BP 125/81 (BP Location: Right Arm)   Pulse 78   Temp 98 F (36.7 C) (Oral)   Resp 18   Ht 5' 5 (1.651 m)   Wt 104.4 kg   LMP 03/04/2023   SpO2 100%   BMI 38.29 kg/m   Physical Exam Vitals and nursing note reviewed.  Constitutional:      Appearance: Normal appearance.  HENT:     Head: Normocephalic and atraumatic.     Nose: Nose normal.     Mouth/Throat:     Mouth: Mucous membranes are moist.  Eyes:     Conjunctiva/sclera: Conjunctivae normal.  Cardiovascular:     Rate and Rhythm: Normal rate and regular rhythm.     Pulses: Normal pulses.     Heart sounds: Normal heart sounds.  Pulmonary:     Effort: Pulmonary effort is normal.     Breath sounds: Normal breath sounds.  Abdominal:     General: Bowel sounds are normal.   Genitourinary:    Comments: Uterus gravida, pelvis adaquate Musculoskeletal:        General: Normal range of motion.     Cervical back: Normal range of motion and neck supple.  Skin:    General: Skin is warm.     Capillary Refill: Capillary refill takes less than 2 seconds.  Neurological:     General: No focal deficit present.     Mental Status: She is alert.  Psychiatric:        Mood and Affect: Mood normal.      NST: FHR baseline 125 bpm, Variability: moderate, Accelerations:present, Decelerations:  Absent= Cat 1/Reactive UC:   irregular, every 4-8 minutes SVE:   Dilation: 6.5 Effacement (%): 80 Station: -2 Exam by:: Perla A. RN, vertex verified by fetal sutures.  Leopold's: Position vertex, EFW 6.5lbs via leopold's.   Labs: Results for orders placed or performed during the hospital encounter of 12/13/23 (from the past 24 hours)  Type and screen  [facility]     Status: None   Collection Time: 12/14/23  2:05 AM  Result Value Ref Range   ABO/RH(D) O POS    Antibody Screen NEG    Sample Expiration      12/17/2023,2359 Performed at [facility], [address] [place], [place] [address]   CBC     Status: Abnormal   Collection Time: 12/14/23  2:09 AM  Result Value Ref Range   WBC 9.1 4.0 - 10.5 K/uL   RBC 3.27 (L) 3.87 - 5.11 MIL/uL   Hemoglobin 10.5 (L) 12.0 - 15.0 g/dL   HCT 67.5 (L) 63.9 - 53.9 %   MCV 99.1 80.0 - 100.0 fL   MCH  32.1 26.0 - 34.0 pg   MCHC 32.4 30.0 - 36.0 g/dL   RDW 85.5 88.4 - 84.4 %   Platelets 247 150 - 400 K/uL   nRBC 0.0 0.0 - 0.2 %    Imaging:  No results found.  MAU Course: Orders Placed This Encounter  Procedures   CBC   RPR   Diet clear liquid Room service appropriate? Yes; Fluid consistency: Thin   Vitals signs per unit policy   Notify physician (specify)   Fetal monitoring per unit policy   Activity as tolerated   Cervical Exam   Measure blood pressure post delivery every 15 min x 1 hour then every  30 min x 1 hour   Fundal check post delivery every 15 min x 1 hour then every 30 min x 1 hour   Apply Labor & Delivery Care Plan   If Rapid HIV test positive or known HIV positive: initiate AZT orders   May in and out cath x 2 for inability to void   Insert urethral catheter X 1 PRN If Coude Catheter is chosen, qualified resources by campus can be found in the clinical skills nursing procedure for Coude Catheter 1. If straight catheterized > 2 times or patient unable to void post epidural plac...   Refer to Sidebar Report Urinary (Foley) Catheter Indications   Refer to Sidebar Report Post Indwelling Urinary Catheter Removal and Intervention Guidelines   Discontinue foley prior to vaginal delivery   Initiate Oral Care Protocol   Initiate Carrier Fluid Protocol   Patient may have epidural placement upon request   Full code   Type and screen [facility]   Insert and maintain IV Line   Admit to Inpatient (patient's expected length of stay will be greater than 2 midnights or inpatient only procedure)   Meds ordered this encounter  Medications   lactated ringers  infusion   FOLLOWED BY Linked Order Group    oxytocin  (PITOCIN ) IV BOLUS FROM BAG    oxytocin  (PITOCIN ) IV infusion 30 units in NS 500 mL - Premix   lactated ringers  infusion 500-1,000 mL   acetaminophen  (TYLENOL ) tablet 650 mg   oxyCODONE -acetaminophen  (PERCOCET/ROXICET) 5-325 MG per tablet 1 tablet   oxyCODONE -acetaminophen  (PERCOCET/ROXICET) 5-325 MG per tablet 2 tablet   sodium phosphate  (FLEET) enema 1 enema   ondansetron  (ZOFRAN ) injection 4 mg   sodium citrate -citric acid  (ORACIT) solution 30 mL   lidocaine  (PF) (XYLOCAINE ) 1 % injection 30 mL    Assessment/Plan:  Valerie [name] is a 33 y.o. female, [id number], IUP at 38.3 weeks, presenting for active labor. Pt endorse + Fm. Denies vaginal leakage. Denies vaginal bleeding. Denies feeling cxt's. See Dr Adolph Marcus with [facility], all prenatal labs unremarkable,  LR, gb-. H/O asthma but controlled with albuterol as needed, obesity, AMA. H/O fishey clips in 2020  FWB: Cat 1 Fetal Tracing.   Plan: Admit to Birthing Suite per consult with Dr Henry Routine [facility] orders Expectant management Pain med/epidural prn Anticipate labor progression   Abhi Moccia  CNM, FNP-C, PMHNP-BC  [id number]  [place], [place] [address]  Cell: [phone]  Office Phone: [phone] Fax: [phone] 12/14/2023  4:13 AM

## 2023-12-14 NOTE — Lactation Note (Signed)
 This note was copied from a baby's chart. Lactation Consultation Note  Patient Name: Valerie [name] Unijb'd Date: 12/14/2023 Age:35 hours Reason for consult: Initial assessment;Early term 37-38.6wks  P3, Baby cueing when [facility] was in room. Mother hand expressed drops. Suggest switching to cross cradle hold to achieve a deeper latch to cradle hold.  Baby latched with ease, lips flanged, intermittent swallows. Feed on demand with cues.  Goal 8-12+ times per day after first 24 hrs.  Place baby STS if not cueing.  Suggest calling for help as needed.  Maternal Data Has patient been taught Hand Expression?: Yes Does the patient have breastfeeding experience prior to this delivery?: Yes How long did the patient breastfeed?: 11 mos. & one year  Feeding Mother's Current Feeding Choice: Breast Milk  LATCH Score Latch: Grasps breast easily, tongue down, lips flanged, rhythmical sucking.  Audible Swallowing: A few with stimulation  Type of Nipple: Everted at rest and after stimulation  Comfort (Breast/Nipple): Soft / non-tender  Hold (Positioning): Assistance needed to correctly position infant at breast and maintain latch.  LATCH Score: 8   Lactation Tools Discussed/Used    Interventions Interventions: Breast feeding basics reviewed;Hand express;Support pillows;Education;LC Services brochure;CDC milk storage guidelines  Discharge Pump: Manual;Personal;Hands Free (Elvie, plans to get Spectra  from insurance)  Consult Status Consult Status: Follow-up Date: 12/15/23 Follow-up type: In-patient   Shannon Levorn Lemme  RN, IBCLC 12/14/2023, 9:21 AM

## 2023-12-14 NOTE — MAU Note (Signed)
 Valerie [name] is a 33 y.o. at [redacted]w[redacted]d here in MAU reporting: ctx's that started around 1800 last night. She states that her ctxs were about 5-10 minutes apart, but are now about 5  minutes apart consistently. Patient was last seen in the office on Thursday and had a membrane sweep at that time. Reports some bloody show since membrane sweep. Patient states that her cervix was 5.5 cm. +FM. Denies LOF.   Onset of complaint: 1800 Pain score: 7 There were no vitals filed for this visit.   FHT: 125  Lab orders placed from triage: urine

## 2023-12-14 NOTE — Anesthesia Procedure Notes (Addendum)
 Combined Spinal Epidural Patient location during procedure: OB Start time: 12/14/2023 4:28 AM End time: 12/14/2023 4:37 AM  Staffing Anesthesiologist: Merla Almarie HERO, DO Performed: anesthesiologist   Preanesthetic Checklist Completed: patient identified, IV checked, site marked, risks and benefits discussed, surgical consent, monitors and equipment checked, pre-op evaluation and timeout performed  Epidural Patient position: sitting Prep: ChloraPrep and site prepped and draped Patient monitoring: heart rate, cardiac monitor, continuous pulse ox and blood pressure Approach: midline Location: L3-L4 Injection technique: LOR air  Needle:  Needle type: Pencan  Needle gauge: 24 G Needle length: 12.7 cm Needle type: Tuohy  Needle gauge: 17 G Needle length: 9 cm Needle insertion depth: 6 cm Catheter type: closed end flexible Catheter size: 19 Gauge Catheter at skin depth: 11 cm  Additional Notes Patient identified. Risks/Benefits/Options discussed with patient including but not limited to bleeding, infection, nerve damage, paralysis, failed block, incomplete pain control, headache, blood pressure changes, nausea, vomiting, reactions to medication both or allergic, itching and postpartum back pain. Confirmed with bedside nurse the patient's most recent platelet count. Confirmed with patient that they are not currently taking any anticoagulation, have any bleeding history or any family history of bleeding disorders. Patient expressed understanding and wished to proceed. All questions were answered. Sterile technique was used throughout the entire procedure. Please see nursing notes for vital signs. Test dose was given through epidural catheter and negative prior to continuing to dose epidural or start infusion. Warning signs of high block given to the patient including shortness of breath, tingling/numbness in hands, complete motor block, or any concerning symptoms with instructions to  call for help. Patient was given instructions on fall risk and not to get out of bed. All questions and concerns addressed with instructions to call with any issues or inadequate analgesia.   Reason for block:procedure for pain

## 2023-12-15 ENCOUNTER — Other Ambulatory Visit (HOSPITAL_COMMUNITY): Payer: Self-pay

## 2023-12-15 LAB — CBC
HCT: 28.9 % — ABNORMAL LOW (ref 36.0–46.0)
Hemoglobin: 9.6 g/dL — ABNORMAL LOW (ref 12.0–15.0)
MCH: 32.2 pg (ref 26.0–34.0)
MCHC: 33.2 g/dL (ref 30.0–36.0)
MCV: 97 fL (ref 80.0–100.0)
Platelets: 209 K/uL (ref 150–400)
RBC: 2.98 MIL/uL — ABNORMAL LOW (ref 3.87–5.11)
RDW: 14.5 % (ref 11.5–15.5)
WBC: 8.6 K/uL (ref 4.0–10.5)
nRBC: 0 % (ref 0.0–0.2)

## 2023-12-15 MED ORDER — IBUPROFEN 600 MG PO TABS
600.0000 mg | ORAL_TABLET | Freq: Four times a day (QID) | ORAL | 0 refills | Status: AC
  Filled 2023-12-15: qty 30, 8d supply, fill #0

## 2023-12-15 MED ORDER — ACETAMINOPHEN 325 MG PO TABS
650.0000 mg | ORAL_TABLET | ORAL | 0 refills | Status: AC | PRN
  Filled 2023-12-15: qty 30, 3d supply, fill #0

## 2023-12-15 MED ORDER — OXYCODONE HCL 5 MG PO TABS
5.0000 mg | ORAL_TABLET | Freq: Once | ORAL | Status: AC
  Administered 2023-12-15: 5 mg via ORAL
  Filled 2023-12-15 (×2): qty 1

## 2023-12-15 MED ORDER — SENNOSIDES-DOCUSATE SODIUM 8.6-50 MG PO TABS
2.0000 | ORAL_TABLET | Freq: Every day | ORAL | 0 refills | Status: AC
  Filled 2023-12-15: qty 14, 7d supply, fill #0

## 2023-12-15 NOTE — Social Work (Signed)
 MOB was referred for history of depression/anxiety.  * Referral screened out by Clinical Social Worker because none of the following criteria appear to apply:  ~ History of anxiety/depression during this pregnancy, or of post-partum depression following prior delivery.  ~ Diagnosis of anxiety and/or depression within last 3 years OR * MOB's symptoms currently being treated with medication and/or therapy.  Per chart review MOB diagnosis dates back to 2012, no MH concerns noted during this pregnancy. Edinburgh=4  Please contact the Clinical Social Worker if needs arise, or by MOB request.   Eliazar Gave, LCSWA Clinical Social Worker [phone]

## 2023-12-15 NOTE — Discharge Summary (Signed)
 Postpartum Discharge Summary     Patient Name: Valerie [name] DOB: 08-31-1990 MRN: [id number]  Date of admission: 12/13/2023 Delivery date:12/14/2023 Delivering provider: [place] , JADE Date of discharge: 12/15/2023  Admitting diagnosis: Normal labor and delivery [O80] Intrauterine pregnancy: [redacted]w[redacted]d     Secondary diagnosis:  Principal Problem:   Normal labor and delivery Active Problems:   SVD (spontaneous vaginal delivery)   Normal postpartum course   IDA (iron deficiency anemia)  Additional problems: n/a    Discharge diagnosis: Term Pregnancy Delivered                                              Post partum procedures:n/a Augmentation: AROM Complications: None  Hospital course: Onset of Labor With Vaginal Delivery      33 y.o. yo [id number] at [redacted]w[redacted]d was admitted in Active Labor on 12/13/2023. Labor course was uncomplicated.  Membrane Rupture Time/Date: 4:46 AM,12/14/2023  Delivery Method:Vaginal, Spontaneous Operative Delivery:N/A Episiotomy: None Lacerations:  None Patient had a postpartum course that was uncomplicated.  She is ambulating, tolerating a regular diet, passing flatus, and urinating well. She has a history of anxiety and depression but desires discuss medication at her 2 weeks follow up. Patient is discharged home in stable condition on 12/15/23.  Newborn Data: Birth date:12/14/2023 Birth time:6:30 AM Gender:Female Living status:Living Apgars:8 ,9  Weight:3010 g  Magnesium  Sulfate received: No BMZ received: No Rhophylac:No MMR:No T-DaP:Given prenatally Flu: No RSV Vaccine received: Yes Transfusion:No Immunizations administered: Immunization History  Administered Date(s) Administered   HPV Quadrivalent 12/31/2005, 03/03/2006, 10/24/2006   Influenza,inj,Quad PF,6+ Mos 12/04/2017   Tdap 04/21/2013, 01/19/2018    Physical exam  Vitals:   12/14/23 1331 12/14/23 1810 12/14/23 2030 12/15/23 0526  BP: 117/83 114/79 114/75 114/85  Pulse: 71 73 72 76   Resp: 16 16 18 18   Temp: 98.1 F (36.7 C) 97.8 F (36.6 C)  98.2 F (36.8 C)  TempSrc: Oral   Oral  SpO2: 100%     Weight:      Height:       General: alert, cooperative, and no distress Lochia: appropriate Uterine Fundus: firm Incision: N/A DVT Evaluation: No evidence of DVT seen on physical exam. Negative Homan's sign. No cords or calf tenderness. No significant calf/ankle edema. Labs: Lab Results  Component Value Date   WBC 8.6 12/15/2023   HGB 9.6 (L) 12/15/2023   HCT 28.9 (L) 12/15/2023   MCV 97.0 12/15/2023   PLT 209 12/15/2023      Latest Ref Rng & Units 10/02/2023    2:47 PM  CMP  Glucose 70 - 99 mg/dL 93   BUN 6 - 20 mg/dL 6   Creatinine 9.55 - 8.99 mg/dL 9.32   Sodium 864 - 854 mmol/L 137   Potassium 3.5 - 5.1 mmol/L 3.3   Chloride 98 - 111 mmol/L 107   CO2 22 - 32 mmol/L 20   Calcium  8.9 - 10.3 mg/dL 9.0   Total Protein 6.5 - 8.1 g/dL 6.2   Total Bilirubin 0.0 - 1.2 mg/dL 0.3   Alkaline Phos 38 - 126 U/L 88   AST 15 - 41 U/L 12   ALT 0 - 44 U/L 9    Edinburgh Score:    12/15/2023    5:26 AM  Edinburgh Postnatal Depression Scale Screening Tool  I have been able to laugh and see  the funny side of things. 0  I have looked forward with enjoyment to things. 0  I have blamed myself unnecessarily when things went wrong. 1  I have been anxious or worried for no good reason. 1  I have felt scared or panicky for no good reason. 1  Things have been getting on top of me. 1  I have been so unhappy that I have had difficulty sleeping. 0  I have felt sad or miserable. 0  I have been so unhappy that I have been crying. 0  The thought of harming myself has occurred to me. 0  Edinburgh Postnatal Depression Scale Total 4      After visit meds:  Allergies as of 12/15/2023       Reactions   Augmentin [amoxicillin-pot Clavulanate] Diarrhea, Nausea And Vomiting       Ciprofloxacin Hives   Doxycycline Hives   Hydrocodone Hives   Prednisone    Had a  reaction to high doses and was told not to take again   Raspberry Swelling   Throat swelling        Medication List     STOP taking these medications    ondansetron  4 MG tablet Commonly known as: Zofran    promethazine  12.5 MG tablet Commonly known as: PHENERGAN        TAKE these medications    acetaminophen  325 MG tablet Commonly known as: Tylenol  Take 2 tablets (650 mg total) by mouth every 4 (four) hours as needed (for pain scale < 4).   Biofreeze 4 % Gel Generic drug: Menthol  (Topical Analgesic) Apply 1 application topically 4 (four) times daily as needed (For back pain.).   cyclobenzaprine  10 MG tablet Commonly known as: FLEXERIL  Take 1 tablet (10 mg total) by mouth 3 (three) times daily as needed for muscle spasms.   ibuprofen  600 MG tablet Commonly known as: ADVIL  Take 1 tablet (600 mg total) by mouth every 6 (six) hours.   PrePLUS 27-1 MG Tabs Take 1 tablet by mouth daily.   senna-docusate 8.6-50 MG tablet Commonly known as: Senokot-S Take 2 tablets by mouth daily for 7 days.         Discharge home in stable condition Infant Feeding: Breast Infant Disposition:home with mother Discharge instruction: per After Visit Summary and Postpartum booklet. Activity: Advance as tolerated. Pelvic rest for 6 weeks.  Diet: routine diet Anticipated Birth Control: Plans Interval BTL Postpartum Appointment:6 weeks Additional Postpartum F/U: Postpartum Depression checkup in 2 weeks Future Appointments:No future appointments. Follow up Visit:  Follow-up Information     Autry-Lott, Afnan Cadiente, DO. Go in 2 week(s).   Specialty: Family Medicine Why: Mood check Contact information: [address] [place], [address] [place] [address] [phone]                     12/15/2023 Rojean Sacramento, DO

## 2023-12-15 NOTE — Anesthesia Postprocedure Evaluation (Signed)
 Anesthesia Post Note  Patient: Valerie [name]  Procedure(s) Performed: AN AD HOC LABOR EPIDURAL     Patient location during evaluation: Mother Baby Anesthesia Type: Epidural Level of consciousness: awake and alert Pain management: pain level controlled Vital Signs Assessment: post-procedure vital signs reviewed and stable Respiratory status: spontaneous breathing, nonlabored ventilation and respiratory function stable Cardiovascular status: stable Postop Assessment: no headache, no backache and epidural receding Anesthetic complications: no   No notable events documented.  Last Vitals:  Vitals:   12/14/23 2030 12/15/23 0526  BP: 114/75 114/85  Pulse: 72 76  Resp: 18 18  Temp:  36.8 C  SpO2:      Last Pain:  Vitals:   12/15/23 0715  TempSrc:   PainSc: Asleep   Pain Goal: Patients Stated Pain Goal: 3 (12/14/23 1848)                 CHERILYN SLOUGH

## 2023-12-15 NOTE — Lactation Note (Signed)
 This note was copied from a baby's chart. Lactation Consultation Note  Patient Name: Valerie [name] Date: 12/15/2023 Age:35 hours Reason for consult: Follow-up assessment;Early term 37-38.6wks;Nipple pain/trauma  P3, Mother has positional stripes on both breasts, nipples are sore. For soreness suggest mother apply ebm or coconut oil while wearing shells and alternate with comfort gels. Baby has short anterior lingual frenulum. Noticeable indention in his tongue when he cries. Provided resource sheet and suggest discussing with Ped MD. Observed baby latch on both breasts.  Suggest latching in cross cradle at first for improved depth. Noted intermittent swallows. Reviewed engorgement care and monitoring voids/stools. Suggest post pumping and giving volume to baby if nipples becomes too sore to latch or baby has weight loss.  Maternal Data Has patient been taught Hand Expression?: Yes  Feeding Mother's Current Feeding Choice: Breast Milk  LATCH Score Latch: Grasps breast easily, tongue down, lips flanged, rhythmical sucking.  Audible Swallowing: A few with stimulation  Type of Nipple: Everted at rest and after stimulation  Comfort (Breast/Nipple): Filling, red/small blisters or bruises, mild/mod discomfort  Hold (Positioning): Assistance needed to correctly position infant at breast and maintain latch.  LATCH Score: 7   Lactation Tools Discussed/Used Tools: Coconut oil;Comfort gels;Shells  Interventions Interventions: Assisted with latch;Hand express;Coconut oil;Shells;Comfort gels;Education  Discharge Discharge Education: Engorgement and breast care;Warning signs for feeding baby Pump: Manual;Personal;Hands Free  Consult Status Consult Status: Complete Date: 12/15/23    Shannon Dines [facility] 12/15/2023, 9:09 AM

## 2023-12-16 ENCOUNTER — Other Ambulatory Visit (HOSPITAL_COMMUNITY): Payer: Self-pay

## 2023-12-16 MED ORDER — ESCITALOPRAM OXALATE 10 MG PO TABS
10.0000 mg | ORAL_TABLET | Freq: Every day | ORAL | 3 refills | Status: AC
  Filled 2023-12-16: qty 30, 30d supply, fill #0

## 2023-12-17 LAB — SURGICAL PATHOLOGY

## 2023-12-23 ENCOUNTER — Telehealth (HOSPITAL_COMMUNITY): Payer: Self-pay | Admitting: *Deleted

## 2023-12-23 NOTE — Telephone Encounter (Signed)
 12/23/2023  Name: Valerie [name] MRN: [id number] DOB: [birth year]  Reason for Call:  Transition of Care Hospital Discharge Call  Contact Status: Patient Contact Status: Complete  Language assistant needed:          Follow-Up Questions: Do You Have Any Concerns About Your Health As You Heal From Delivery?: No Do You Have Any Concerns About Your Infants Health?: Yes What Concerns Do You Have About Your Baby?: Patient reported that infant's umbilical cord fell off 2 days ago, and now when I change his diaper, I have seen a pea-sized amount of blood. RN instructed patient that a small amount of bleeding can be normal. Encouraged patient to continue to keep the site clean and dry until it has completely healed. Instructed patient to call pediatrician if site doesn't seem to be healing, if bleeding increases, or if signs of infection are observed. Patient verbalized understanding. Voiced no other questions or concerns at this time.  Edinburgh Postnatal Depression Scale:  In the Past 7 Days: I have been able to laugh and see the funny side of things.: As much as I always could I have looked forward with enjoyment to things.: As much as I ever did I have blamed myself unnecessarily when things went wrong.: Yes, some of the time I have been anxious or worried for no good reason.: Yes, sometimes I have felt scared or panicky for no good reason.: Yes, sometimes Things have been getting on top of me.: No, I have been coping as well as ever I have been so unhappy that I have had difficulty sleeping.: Not at all I have felt sad or miserable.: Not very often I have been so unhappy that I have been crying.: No, never The thought of harming myself has occurred to me.: Never Edinburgh Postnatal Depression Scale Total: 7  PHQ2-9 Depression Scale:     Discharge Follow-up: Edinburgh score requires follow up?: No Patient was advised of the following resources:: Breastfeeding Support Group, Support  Group Declined email information at this time. Post-discharge interventions: Reviewed Newborn Safe Sleep Practices  Signature Allean IVAR Carton, NR, 12/23/23, [phone]

## 2024-03-23 ENCOUNTER — Encounter: Payer: Self-pay | Admitting: Obstetrics and Gynecology

## 2024-03-23 ENCOUNTER — Ambulatory Visit (INDEPENDENT_AMBULATORY_CARE_PROVIDER_SITE_OTHER): Payer: PRIVATE HEALTH INSURANCE | Admitting: Obstetrics and Gynecology

## 2024-03-23 VITALS — BP 118/78 | HR 92 | Temp 97.8°F | Ht 65.25 in | Wt 209.0 lb

## 2024-03-23 DIAGNOSIS — Z3041 Encounter for surveillance of contraceptive pills: Secondary | ICD-10-CM | POA: Diagnosis not present

## 2024-03-23 DIAGNOSIS — Z3009 Encounter for other general counseling and advice on contraception: Secondary | ICD-10-CM | POA: Diagnosis not present

## 2024-03-23 MED ORDER — NORETHINDRONE 0.35 MG PO TABS: 1.0000 | ORAL_TABLET | Freq: Every day | ORAL | 2 refills | Status: AC

## 2024-03-23 NOTE — Patient Instructions (Signed)
 Preoperative instructions: Nothing to eat or drink after midnight, unless instructed differently regarding clear liquids by the anesthesia team at [facility] health. Do not take any medications on the day of surgery, except those listed below: NONE Please follow all other instructions as provided by our surgical scheduler at [facility] and the anesthesia team at [facility] health.  Postoperative instructions: Clean your incision daily with mild soapy water.  Ensure that you thoroughly dry your wound following cleaning and keep dry throughout the day.  You can apply a thin layer of Aquaphor or Vaseline over your incision. Ice packs can be applied to your incision for up to 20 minutes at a time to help with pain and swelling.  Take ibuprofen  as prescribed and over-the-counter Tylenol  as needed. (DO NOT TAKE IBUPROFEN  IF YOU HAVE CONTRAINDICATIONS THAT MAY INCLUDE USE OF BLOOD THINNERS, HISTORY STOMACH SURGERY OR GASTRITIS, CHRONIC KIDNEY DISEASE, ALLERGY, PRIOR INSTRUCTION TO AVOID IBUPROFEN -LIKE MEDICATIONS.)

## 2024-03-23 NOTE — Progress Notes (Signed)
 "  34 y.o. [id number] female here for new patient referral for surgical consult. 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  No LMP recorded. (Menstrual status: Oral contraceptives).   She reports history of failed tubal sterilization in 2020 (Filschie clips). She is 4 month postpartum and desires sterilization and possible hysterectomy given failed prior sterilization procedure. She has 5 total kids with partners kids. She no longer desires fertility.  Normal cycles. Lactating and on POP for contraception. No CP or SOB with stair climbing or home chores.  Birth control: POP Sexually active: Yes   OB History  Gravida Para Term Preterm AB Living  5 3 3  2 3   SAB IAB Ectopic Multiple Live Births  2   0 3    # Outcome Date GA Lbr Len/2nd Weight Sex Type Anes PTL Lv  5 Term 12/14/23 [redacted]w[redacted]d  6 lb 10.2 oz (3.01 kg) M Vag-Spont EPI  LIV     Birth Comments: WNL  4 Term 04/01/18 [redacted]w[redacted]d / 00:18 6 lb 2.1 oz (2.78 kg) F Vag-Spont EPI  LIV  3 Term 07/07/13 [redacted]w[redacted]d 10:05 / 01:11 6 lb 4.5 oz (2.85 kg) M Vag-Spont EPI  LIV  2 SAB 02/16/12 [redacted]w[redacted]d         1 SAB 12/17/03 [redacted]w[redacted]d          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   BV (bacterial vaginosis)    Depression    Eczema    Family history of diabetes in pregnancy 11/27/2012   First degree heart block    Gastric ulcer    2000   Heart murmur    Infection    UTI, chronic sinus inf   Migraine    Mitral valve regurgitation    Ovarian cyst    SVD (spontaneous vaginal delivery) 12/14/2023   Tricuspid valve regurgitation    UTI (urinary tract infection)    Past Surgical History:  Procedure Laterality Date   ADENOIDECTOMY     COLPOSCOPY     CRYOTHERAPY     LEEP     MYRINGOTOMY WITH TUBE PLACEMENT     TUBAL LIGATION N/A 04/01/2018   Procedure: POST PARTUM TUBAL LIGATION;  Surgeon: Kandis Devaughn Sayres, MD;  Location: [facility];  Service: Gynecology;  Laterality: N/A;   Medications Ordered Prior to Encounter[1] Allergies[2]    PE Today's Vitals   03/23/24 1044  BP: 118/78  Pulse: 92  Temp: 97.8 F (36.6 C)  TempSrc: Oral  SpO2: 98%  Weight: 209 lb (94.8 kg)  Height: 5' 5.25 (1.657 m)   Body mass index is 34.51 kg/m.  Physical Exam Vitals reviewed.  Constitutional:      General: She is not in acute distress.    Appearance: Normal appearance.  HENT:     Head: Normocephalic and atraumatic.     Nose: Nose normal.  Eyes:     Extraocular Movements: Extraocular movements intact.     Conjunctiva/sclera: Conjunctivae normal.  Cardiovascular:     Rate and Rhythm: Normal rate and regular rhythm.     Heart sounds: No murmur heard.    No friction rub. No gallop.  Pulmonary:     Effort: Pulmonary effort is normal. No respiratory distress.     Breath sounds: Normal breath sounds. No stridor. No wheezing, rhonchi or rales.  Musculoskeletal:        General: Normal range of motion.     Cervical back: Normal range of motion.  Neurological:     General: No focal deficit present.     Mental Status: She is alert.  Psychiatric:        Mood and Affect: Mood normal.        Behavior: Behavior normal.       Assessment and Plan:        Encounter for consultation for female sterilization -     Ambulatory Referral For Surgery Scheduling  Oral contraceptive pill surveillance -     Norethindrone ; Take 1 tablet (0.35 mg total) by mouth daily.  Dispense: 28 tablet; Refill: 2  Contraceptive options were reviewed, including implant, IUDs and sterilization as indicated. Patient desires permanent sterilization. Will continue POP at this time.  Recommend via laparoscopic bilateral salpingectomy with general endotracheal anesthesia. No medical indication for hysterectomy at this time. Reviewed benefits including unintended pregnancy rates <1% and decreased risk of fallopian tubes and ovary cancers, as well as  risks including regret, inability for reversal, bleeding, infection, and damage to surrounding organs and vessels. Patient agrees to blood transfusion if indicated. Reviewed outpatient procedure that will require transportation to hospital and 1-2 weeks out of work. NPO after midnight.  Preop checklist: Antibiotics: none DVT ppx: SCDs Postop visit: 2 week Additional clearance: none Tubal papers signed: yes, 03/23/24   Vera LULLA Pa, MD      [1]  Current Outpatient Medications on File Prior to Visit  Medication Sig Dispense Refill   acetaminophen  (TYLENOL ) 325 MG tablet Take 2 tablets (650 mg total) by mouth every 4 (four) hours as needed (for pain scale < 4). 30 tablet 0   cyclobenzaprine  (FLEXERIL ) 10 MG tablet Take 1 tablet (10 mg total) by mouth 3 (three) times daily as needed for muscle spasms. 30 tablet 0   escitalopram  (LEXAPRO ) 10 MG tablet Take 1 tablet (10 mg total) by mouth daily. (Patient taking differently: Take 20 mg by mouth daily.) 30 tablet 3   ibuprofen  (ADVIL ) 600 MG tablet Take 1 tablet (600 mg total) by mouth every 6 (six) hours. 30 tablet 0   Menthol , Topical Analgesic, (BIOFREEZE) 4 % GEL Apply 1 application topically 4 (four) times daily as needed (For back pain.).     Prenatal Vit-Fe Fumarate-FA (PREPLUS) 27-1 MG TABS Take 1 tablet by mouth daily. 30 tablet 13   No current facility-administered medications on file prior to visit.  [2]  Allergies Allergen Reactions   Augmentin [Amoxicillin-Pot Clavulanate] Diarrhea and Nausea And Vomiting        Ciprofloxacin Hives   Doxycycline Hives   Hydrocodone Hives   Prednisone     Had a reaction to high doses and was told not to take again   Raspberry Swelling    Throat swelling   "

## 2024-03-26 ENCOUNTER — Encounter: Payer: Self-pay | Admitting: Obstetrics and Gynecology

## 2024-04-16 ENCOUNTER — Emergency Department (HOSPITAL_BASED_OUTPATIENT_CLINIC_OR_DEPARTMENT_OTHER): Payer: Worker's Compensation | Admitting: Radiology

## 2024-04-16 ENCOUNTER — Emergency Department (HOSPITAL_BASED_OUTPATIENT_CLINIC_OR_DEPARTMENT_OTHER)
Admission: EM | Admit: 2024-04-16 | Discharge: 2024-04-16 | Disposition: A | Discharge status: Home / Self Care | Payer: Worker's Compensation | Attending: Emergency Medicine | Admitting: Emergency Medicine

## 2024-04-16 ENCOUNTER — Other Ambulatory Visit: Payer: Self-pay

## 2024-04-16 ENCOUNTER — Encounter (HOSPITAL_BASED_OUTPATIENT_CLINIC_OR_DEPARTMENT_OTHER): Payer: Self-pay | Admitting: Emergency Medicine

## 2024-04-16 DIAGNOSIS — W000XXA Fall on same level due to ice and snow, initial encounter: Secondary | ICD-10-CM | POA: Insufficient documentation

## 2024-04-16 DIAGNOSIS — S0990XA Unspecified injury of head, initial encounter: Secondary | ICD-10-CM | POA: Diagnosis present

## 2024-04-16 DIAGNOSIS — S060X0A Concussion without loss of consciousness, initial encounter: Secondary | ICD-10-CM | POA: Diagnosis not present

## 2024-04-16 DIAGNOSIS — M25512 Pain in left shoulder: Secondary | ICD-10-CM | POA: Diagnosis not present

## 2024-04-16 DIAGNOSIS — R0781 Pleurodynia: Secondary | ICD-10-CM | POA: Diagnosis not present

## 2024-04-16 LAB — PREGNANCY, URINE: Preg Test, Ur: NEGATIVE

## 2024-04-16 MED ORDER — LIDOCAINE 5 % EX PTCH
1.0000 | MEDICATED_PATCH | CUTANEOUS | Status: DC
  Administered 2024-04-16: 1 via TRANSDERMAL
  Filled 2024-04-16: qty 1

## 2024-04-16 MED ORDER — CYCLOBENZAPRINE HCL 10 MG PO TABS: 5.0000 mg | ORAL_TABLET | Freq: Every evening | ORAL | 0 refills | Status: AC | PRN

## 2024-04-16 MED ORDER — LIDOCAINE 5 % EX PTCH: 1.0000 | MEDICATED_PATCH | CUTANEOUS | 0 refills | Status: AC

## 2024-04-16 MED ORDER — ACETAMINOPHEN 500 MG PO TABS
1000.0000 mg | ORAL_TABLET | Freq: Once | ORAL | Status: AC
  Administered 2024-04-16: 1000 mg via ORAL
  Filled 2024-04-16: qty 2

## 2024-04-16 NOTE — ED Triage Notes (Signed)
 Fall/ slip on ice around noon Hit head on car Denies LOC Some dizziness Pain in head neck and back, ribs

## 2024-04-16 NOTE — ED Provider Notes (Signed)
 " [place] EMERGENCY DEPARTMENT AT [facility] Provider Note   CSN: [id number] Arrival date & time: 04/16/24  1406     Patient presents with: Valerie [name] is a 34 y.o. female who is currently breast-feeding, presents with concern for mechanical fall that occurred earlier today.  Reports that she slipped on the ice that was on the ground, causing her to fall onto her buttocks.  She reports that she did hit the right backside of her head.  Denies any loss of consciousness and is not on any anticoagulation.  Reports that since the fall, she has been having some dizziness.  Also reports that she is having some pain in her left shoulder and the left side of her ribs.  Reports that the rib pain does seem to be improving since the initial fall.  Denies any shortness of breath.  No double vision, vomiting, or severe headache.    Fall Pertinent negatives include no headaches.       Prior to Admission medications  Medication Sig Start Date End Date Taking? Authorizing Provider  cyclobenzaprine  (FLEXERIL ) 10 MG tablet Take 0.5-1 tablets (5-10 mg total) by mouth at bedtime as needed for muscle spasms (Muscle pain). 04/16/24  Yes Veta Palma, PA-C  lidocaine  (LIDODERM ) 5 % Place 1-2 patches onto the skin daily. Place 1-2 patches onto the area of pain for up to 12 hours at a time.  Then remove the patches for full 12 hours before reapplying a new patch. 04/16/24  Yes Veta Palma, PA-C  acetaminophen  (TYLENOL ) 325 MG tablet Take 2 tablets (650 mg total) by mouth every 4 (four) hours as needed (for pain scale < 4). 12/15/23   Autry-Lott, Rojean, DO  escitalopram  (LEXAPRO ) 10 MG tablet Take 1 tablet (10 mg total) by mouth daily. Patient taking differently: Take 20 mg by mouth daily. 12/16/23   Rosalva Sawyer, MD  ibuprofen  (ADVIL ) 600 MG tablet Take 1 tablet (600 mg total) by mouth every 6 (six) hours. 12/15/23   Autry-Lott, Rojean, DO  Menthol , Topical Analgesic, (BIOFREEZE) 4 %  GEL Apply 1 application topically 4 (four) times daily as needed (For back pain.).    [provider]  norethindrone  (MICRONOR ) 0.35 MG tablet Take 1 tablet (0.35 mg total) by mouth daily. 03/23/24   Dallie Vera GAILS, MD  Prenatal Vit-Fe Fumarate-FA (PREPLUS) 27-1 MG TABS Take 1 tablet by mouth daily. 05/06/23   Eldonna Suzen Octave, MD    Allergies: Augmentin [amoxicillin-pot clavulanate], Ciprofloxacin, Doxycycline, Hydrocodone, Prednisone, and Raspberry    Review of Systems  Neurological:  Negative for headaches.    Updated Vital Signs BP 130/86 (BP Location: Left Arm)   Pulse 81   Temp 98.5 F (36.9 C) (Oral)   Resp 18   SpO2 99%   Breastfeeding Yes   Physical Exam Vitals and nursing note reviewed.  Constitutional:      Appearance: Normal appearance.  HENT:     Head: Atraumatic.     Right Ear: Tympanic membrane normal.     Left Ear: Tympanic membrane normal.     Ears:     Comments: No hemotympanum Eyes:     Extraocular Movements: Extraocular movements intact.     Conjunctiva/sclera: Conjunctivae normal.     Pupils: Pupils are equal, round, and reactive to light.  Cardiovascular:     Comments: 2+ radial pulses bilaterally Pulmonary:     Effort: Pulmonary effort is normal.     Breath sounds: Normal breath sounds.  Abdominal:     General: Abdomen is flat.     Palpations: Abdomen is soft.     Tenderness: There is no abdominal tenderness.  Musculoskeletal:     Cervical back: Normal range of motion. No rigidity.     Comments: General No obvious deformity. No erythema, edema, contusions, open wounds   Palpation Tender to palpation over the left scapular ridge.   Non-tender to palpation of the clavicles,humerus, radius and ulna, carpal bones, 1st-5th metacarpals and phalanges bilaterally Non tender over the femur, patella, tibia or fibula bilaterally  Mild tenderness over the lumbar spine.  Non-tender over the cervical or thoracic  spinous processes.  No point  tenderness over the rib cage diffusely.  Tenderness over the left trapezius muscle  No tenderness of the pelvis diffusely  ROM Full ROM of shoulders bilaterally, but with some pain of the left shoulder Full elbow, wrist, knee flexion and extension bilaterally Intact plantarflexion and dorsiflexion, hip flexion bilaterally  Strength: 5/5 strength with resisted elbow and wrist flexion and extension bilaterally    Neurological:     General: No focal deficit present.     Mental Status: She is alert.     Comments: Mental status: Alert and oriented to self, place, and month  Speech: Answers questions appropriately  Cranial Nerves: III, IV, VI: EOM intact, Pupils equal round and reactive, no gaze preference or deviation, no nystagmus. V: normal sensation in V1, V2, and V3 segments bilaterally VII: smiles, puffs cheeks, raises eyebrows, and closes eyes without asymmetry.  VIII: normal hearing to speech IX, X: normal palatal elevation, no uvular deviation XI: 5/5 head turn and 5/5 shoulder shrug bilaterally XII: midline tongue protrusion  Sensation: Patient reports slightly dulled sensation in the left hand diffusely compared to her right hand.   Gait: Normal   Psychiatric:        Mood and Affect: Mood normal.        Behavior: Behavior normal.     (all labs ordered are listed, but only abnormal results are displayed) Labs Reviewed  PREGNANCY, URINE    EKG: None  Radiology: DG Ribs Unilateral W/Chest Left Result Date: 04/16/2024 CLINICAL DATA:  Clemens, left scapular pain EXAM: LEFT RIBS AND CHEST - 3+ VIEW COMPARISON:  10/19/2019 FINDINGS: Frontal view of the chest as well as frontal and oblique views of the left thoracic cage are obtained. The cardiac silhouette is unremarkable. No airspace disease, effusion, or pneumothorax. There are no acute displaced fractures. IMPRESSION: 1. No acute intrathoracic process.  No displaced rib fracture. Electronically Signed   By: Ozell Daring M.D.   On: 04/16/2024 16:49   DG Lumbar Spine Complete Result Date: 04/16/2024 CLINICAL DATA:  Slipped and fell EXAM: DG LUMBAR SPINE COMPLETE 4+V COMPARISON:  None Available. FINDINGS: Frontal, bilateral oblique, and lateral views of the lumbar spine are obtained on 4 images. There are 5 non-rib-bearing lumbar type vertebral bodies in grossly normal anatomic alignment. No acute fracture. Mild L5-S1 spondylosis and facet hypertrophy. The sacroiliac joints appear unremarkable. IMPRESSION: 1. No acute lumbar spine fracture. 2. Mild degenerative change at the lumbosacral junction. Electronically Signed   By: Ozell Daring M.D.   On: 04/16/2024 16:48   DG Shoulder Left Result Date: 04/16/2024 CLINICAL DATA:  Clemens, left scapular pain EXAM: LEFT SHOULDER - 2+ VIEW COMPARISON:  None Available. FINDINGS: Frontal, transscapular, and axillary views of the left shoulder are obtained. No acute fracture, subluxation, or dislocation. Joint spaces are well preserved. Soft tissues are unremarkable.  Visualized portions of the left chest are clear. IMPRESSION: 1. Unremarkable left shoulder. Electronically Signed   By: Ozell Daring M.D.   On: 04/16/2024 16:48     Procedures   Medications Ordered in the ED  lidocaine  (LIDODERM ) 5 % 1 patch (1 patch Transdermal Patch Applied 04/16/24 1549)  acetaminophen  (TYLENOL ) tablet 1,000 mg (1,000 mg Oral Given 04/16/24 1547)                                    Medical Decision Making Amount and/or Complexity of Data Reviewed Labs: ordered. Radiology: ordered.  Risk OTC drugs. Prescription drug management.    Differential diagnosis includes but is not limited to fracture, dislocation, sprain, strain, contusion, laceration, nerve injury, vascular injury, compartment syndrome, intracranial hemorrhage, skull fracture, spinal injury, pneumothorax, concussion  ED Course:  Upon initial evaluation, patient is well-appearing, no acute distress.  Normal vitals.  She  does not have any cranial nerve deficits.  Reports slightly more diminished sensation in her left hand compared to right, but has 5/5 strength throughout the upper extremities bilaterally.  Diminished sensation is diffuse throughout the left hand and not in a particular nerve distribution.  She is vascularly intact in the bilateral upper extremities with brisk cap refill in the left and right hand and 2+ radial pulse bilaterally. No signs of head trauma such as hematomas, lacerations, or ecchymoses.  No Battle sign or raccoon eyes.  No hemotympanum on exam.  She does not have any skull step-offs noted.  No neurologic deficits.  She did walk for me in the room and does appear stable when walking.  She is not on any anticoagulation, there was no loss of consciousness, no vomiting or headache, I doubt intracranial hemorrhage or skull fracture.  Do not feel patient needs head CT at this time.  Her symptoms are most consistent with concussion. Patient does have some mild tenderness over the spinous processes of the lumbar spine.  No tenderness over the cervical or thoracic spine.  She does have some tenderness over the left trapezius muscle and also over the left scapular ridge.  She does have intact range of motion of the shoulders bilaterally, but does have pain in the left shoulder with range of motion.  She was reporting some left-sided rib pain earlier from the fall, but reports that this is better currently.  She does not have any point tenderness over the ribs diffusely.  We will obtain an x-ray of the lumbar spine, as well as x-ray of the shoulder and ribs to rule out any further injuries.  Labs Ordered: I Ordered, and personally interpreted labs.  The pertinent results include:   Urine pregnancy test negative  Imaging Studies ordered: I ordered imaging studies including x-ray left shoulder, left ribs, lumbar spine I independently visualized the imaging with scope of interpretation limited to  determining acute life threatening conditions related to emergency care. Imaging showed no acute abnormalities I agree with the radiologist interpretation  Medications Given: Tylenol  Lidocaine  patch  Imaging was reviewed which revealed no acute abnormalities.  Suspect that the pain of her left shoulder secondary to underlying bone bruising from her fall.  Given patient did hit her head and reports some dizziness, I do suspect she has a concussion.  No red flag signs to suggest intracranial hemorrhage.  We discussed that she can continue taking Tylenol  and using the lidocaine  patches as needed for pain.  Patient is breast-feeding, but would like to try a muscle relaxer at home for pain.  We discussed that she can use Flexeril  and pump and dump her milk supply at home while taking this medication.  Patient stable and appropriate for discharge home.     Impression: Mechanical fall Concussion Left shoulder pain  Disposition:  Patient discharged home with instructions to take Tylenol  and use lidocaine  patches as needed for pain.  May take Flexeril  if needed for muscle spasm/pain. She understands that Flexeril  can make her drowsy and do not drink alcohol or drive after taking this medication.  She also understands to pump and dump her breastmilk if taking the Flexeril .  She understands that if she needs to take ibuprofen  for pain, she should also pump and dump her breastmilk.  Follow-up with concussion clinic if symptoms not improved within the next week. Return precautions given and patient verbalized understanding.  This chart was dictated using voice recognition software, Dragon. Despite the best efforts of this provider to proofread and correct errors, errors may still occur which can change documentation meaning.       Final diagnoses:  Concussion without loss of consciousness, initial encounter    ED Discharge Orders          Ordered    lidocaine  (LIDODERM ) 5 %  Every 24 hours         04/16/24 1739    cyclobenzaprine  (FLEXERIL ) 10 MG tablet  At bedtime PRN        04/16/24 1739               Veta Palma, PA-C 04/16/24 1749  "

## 2024-04-16 NOTE — Discharge Instructions (Addendum)
 You appear to have a concussion which is a state of changed mental ability from trauma.  Refrain from any strenuous physical activities or any activities that require lots of focus for the next 48 hours. Decrease your screen time (time on phones, TV, laptop, etc) to no more than 30 minutes per day while symptoms persist and get at least 8 hours of sleep at night.   You may engage in light physical activity (such as walking) as long as it does not exacerbate your symptoms.  You may return to work/school as tolerated.  The x-ray of your left shoulder, ribs, and lumbar spine does not show any acute injuries.  You may take up to 1000mg  of tylenol  every 6 hours as needed for pain.  Do not take more then 4g per day.  You may also try the following medications if needed for pain.  Although it is generally recognized as safe to breastfeed while on the following medications, they do pass into breast milk. Just to be safe, I would pump and dump if taking the following medications.  You have been prescribed a muscle relaxer called Flexeril  (cyclobenzaprine ). You may take 0.5 - 1 tablet (5-10mg ) before bed as needed for muscle pain. This medication can be sedating. Do not drive or operate heavy machinery after taking this medicine. Do not drink alcohol or take other sedating medications when taking this medicine for safety reasons.  Keep this out of reach of small children.  You may use up to 600mg  ibuprofen  every 6 hours as needed for pain.  Do not exceed 2.4g of ibuprofen  per day.  Return to the ER if: There is severe confusion or drowsiness You have repetitive vomiting   You notice dizziness or unsteadiness which is getting worse, or inability to walk.  You lose consciousness You experience severe, persistent headaches not relieved by Tylenol  or ibuprofen  There are changes in pupil sizes. (This is the black center in the colored part of the eye)  You have changes in your vision Any other new or  concerning symptoms

## 2024-04-30 ENCOUNTER — Encounter (HOSPITAL_COMMUNITY): Admission: RE | Payer: Self-pay | Source: Home / Self Care

## 2024-04-30 ENCOUNTER — Ambulatory Visit (HOSPITAL_COMMUNITY): Admission: RE | Admit: 2024-04-30 | Payer: PRIVATE HEALTH INSURANCE | Admitting: Obstetrics and Gynecology

## 2024-04-30 SURGERY — SALPINGECTOMY, BILATERAL, LAPAROSCOPIC
Anesthesia: General | Laterality: Bilateral
